# Patient Record
Sex: Female | Born: 1955
Health system: Southern US, Community
[De-identification: ages and names within clinical notes are randomized; demographics above are authoritative.]

## PROBLEM LIST (undated history)

## (undated) DIAGNOSIS — Z9889 Other specified postprocedural states: Secondary | ICD-10-CM

## (undated) DIAGNOSIS — M549 Dorsalgia, unspecified: Secondary | ICD-10-CM

## (undated) DIAGNOSIS — N1330 Unspecified hydronephrosis: Secondary | ICD-10-CM

## (undated) DIAGNOSIS — G8929 Other chronic pain: Secondary | ICD-10-CM

## (undated) DIAGNOSIS — E7800 Pure hypercholesterolemia, unspecified: Secondary | ICD-10-CM

## (undated) DIAGNOSIS — N135 Crossing vessel and stricture of ureter without hydronephrosis: Secondary | ICD-10-CM

## (undated) DIAGNOSIS — I10 Essential (primary) hypertension: Secondary | ICD-10-CM

## (undated) DIAGNOSIS — E039 Hypothyroidism, unspecified: Secondary | ICD-10-CM

## (undated) DIAGNOSIS — R112 Nausea with vomiting, unspecified: Secondary | ICD-10-CM

## (undated) DIAGNOSIS — T8859XA Other complications of anesthesia, initial encounter: Secondary | ICD-10-CM

## (undated) DIAGNOSIS — E119 Type 2 diabetes mellitus without complications: Secondary | ICD-10-CM

## (undated) DIAGNOSIS — Z87442 Personal history of urinary calculi: Secondary | ICD-10-CM

## (undated) HISTORY — PX: BACK SURGERY: SHX140

## (undated) HISTORY — PX: ABDOMINAL HYSTERECTOMY: SHX81

## (undated) HISTORY — PX: CHOLECYSTECTOMY: SHX55

---

## 1998-07-27 ENCOUNTER — Encounter: Payer: Self-pay | Admitting: Neurological Surgery

## 1998-07-27 ENCOUNTER — Ambulatory Visit (HOSPITAL_COMMUNITY): Admission: RE | Admit: 1998-07-27 | Discharge: 1998-07-27 | Payer: Self-pay | Admitting: Neurological Surgery

## 1998-10-16 ENCOUNTER — Observation Stay (HOSPITAL_COMMUNITY): Admission: RE | Admit: 1998-10-16 | Discharge: 1998-10-17 | Payer: Self-pay | Admitting: Neurosurgery

## 1998-10-16 ENCOUNTER — Encounter: Payer: Self-pay | Admitting: Neurosurgery

## 2000-04-07 ENCOUNTER — Other Ambulatory Visit: Admission: RE | Admit: 2000-04-07 | Discharge: 2000-04-07 | Payer: Self-pay | Admitting: Obstetrics & Gynecology

## 2001-02-28 ENCOUNTER — Other Ambulatory Visit: Admission: RE | Admit: 2001-02-28 | Discharge: 2001-02-28 | Payer: Self-pay | Admitting: Obstetrics & Gynecology

## 2001-04-07 ENCOUNTER — Ambulatory Visit (HOSPITAL_COMMUNITY): Admission: RE | Admit: 2001-04-07 | Discharge: 2001-04-07 | Payer: Self-pay | Admitting: Obstetrics & Gynecology

## 2001-04-07 ENCOUNTER — Encounter: Payer: Self-pay | Admitting: Obstetrics & Gynecology

## 2003-01-21 ENCOUNTER — Other Ambulatory Visit: Admission: RE | Admit: 2003-01-21 | Discharge: 2003-01-21 | Payer: Self-pay | Admitting: Obstetrics & Gynecology

## 2009-09-13 HISTORY — PX: SP DIL URETER: HXRAD352

## 2009-09-13 HISTORY — PX: URETERAL STENT PLACEMENT: SHX822

## 2010-01-22 ENCOUNTER — Emergency Department (HOSPITAL_COMMUNITY): Admission: EM | Admit: 2010-01-22 | Discharge: 2010-01-22 | Payer: Self-pay | Admitting: Emergency Medicine

## 2010-02-10 ENCOUNTER — Ambulatory Visit (HOSPITAL_COMMUNITY): Admission: RE | Admit: 2010-02-10 | Discharge: 2010-02-10 | Payer: Self-pay | Admitting: Urology

## 2010-02-18 ENCOUNTER — Ambulatory Visit (HOSPITAL_COMMUNITY): Admission: RE | Admit: 2010-02-18 | Discharge: 2010-02-18 | Payer: Self-pay | Admitting: Urology

## 2010-11-30 LAB — CBC
HCT: 37.4 % (ref 36.0–46.0)
Hemoglobin: 13 g/dL (ref 12.0–15.0)
RDW: 14 % (ref 11.5–15.5)

## 2010-11-30 LAB — BASIC METABOLIC PANEL
GFR calc non Af Amer: >60 mL/min (ref >60)
Glucose, Bld: 132 mg/dL — ABNORMAL HIGH (ref 70–99)
Potassium: 3.6 mEq/L (ref 3.5–5.1)
Sodium: 137 mEq/L (ref 135–145)

## 2010-12-01 LAB — BASIC METABOLIC PANEL
Calcium: 9.3 mg/dL (ref 8.4–10.5)
Creatinine, Ser: 0.53 mg/dL (ref 0.4–1.2)
GFR calc Af Amer: >60 mL/min (ref >60)

## 2010-12-01 LAB — DIFFERENTIAL
Basophils Relative: 0 % (ref 0–1)
Lymphs Abs: 1.6 10*3/uL (ref 0.7–4.0)
Monocytes Relative: 1 % — ABNORMAL LOW (ref 3–12)
Neutro Abs: 16.6 10*3/uL — ABNORMAL HIGH (ref 1.7–7.7)
Neutrophils Relative %: 90 % — ABNORMAL HIGH (ref 43–77)

## 2010-12-01 LAB — CBC
Platelets: 220 10*3/uL (ref 150–400)
RBC: 4.5 MIL/uL (ref 3.87–5.11)
WBC: 18.5 10*3/uL — ABNORMAL HIGH (ref 4.0–10.5)

## 2010-12-01 LAB — URINALYSIS, ROUTINE W REFLEX MICROSCOPIC
Glucose, UA: 100 mg/dL — AB
Ketones, ur: 15 mg/dL — AB
Specific Gravity, Urine: 1.02 (ref 1.005–1.030)
pH: 7 (ref 5.0–8.0)

## 2010-12-01 LAB — URINE MICROSCOPIC-ADD ON

## 2014-01-01 ENCOUNTER — Emergency Department (HOSPITAL_COMMUNITY)
Admission: EM | Admit: 2014-01-01 | Discharge: 2014-01-01 | Disposition: A | Discharge status: Home / Self Care | Payer: 59 | Attending: Emergency Medicine | Admitting: Emergency Medicine

## 2014-01-01 ENCOUNTER — Encounter (HOSPITAL_COMMUNITY): Payer: Self-pay | Admitting: Emergency Medicine

## 2014-01-01 DIAGNOSIS — E119 Type 2 diabetes mellitus without complications: Secondary | ICD-10-CM | POA: Insufficient documentation

## 2014-01-01 DIAGNOSIS — G8911 Acute pain due to trauma: Secondary | ICD-10-CM | POA: Insufficient documentation

## 2014-01-01 DIAGNOSIS — M533 Sacrococcygeal disorders, not elsewhere classified: Secondary | ICD-10-CM

## 2014-01-01 DIAGNOSIS — G8929 Other chronic pain: Secondary | ICD-10-CM | POA: Insufficient documentation

## 2014-01-01 DIAGNOSIS — Z87448 Personal history of other diseases of urinary system: Secondary | ICD-10-CM | POA: Insufficient documentation

## 2014-01-01 DIAGNOSIS — Z87891 Personal history of nicotine dependence: Secondary | ICD-10-CM | POA: Insufficient documentation

## 2014-01-01 DIAGNOSIS — I10 Essential (primary) hypertension: Secondary | ICD-10-CM | POA: Insufficient documentation

## 2014-01-01 DIAGNOSIS — Z9889 Other specified postprocedural states: Secondary | ICD-10-CM | POA: Insufficient documentation

## 2014-01-01 HISTORY — DX: Type 2 diabetes mellitus without complications: E11.9

## 2014-01-01 HISTORY — DX: Dorsalgia, unspecified: M54.9

## 2014-01-01 HISTORY — DX: Other chronic pain: G89.29

## 2014-01-01 HISTORY — DX: Essential (primary) hypertension: I10

## 2014-01-01 HISTORY — DX: Crossing vessel and stricture of ureter without hydronephrosis: N13.5

## 2014-01-01 HISTORY — DX: Unspecified hydronephrosis: N13.30

## 2014-01-01 MED ORDER — KETOROLAC TROMETHAMINE 60 MG/2ML IM SOLN
60.0000 mg | Freq: Once | INTRAMUSCULAR | Status: AC
  Administered 2014-01-01: 60 mg via INTRAMUSCULAR
  Filled 2014-01-01: qty 2

## 2014-01-01 MED ORDER — NAPROXEN 500 MG PO TABS: 500.0000 mg | ORAL_TABLET | Freq: Two times a day (BID) | ORAL | Status: DC

## 2014-01-01 MED ORDER — METHOCARBAMOL 500 MG PO TABS: ORAL_TABLET | ORAL | Status: DC

## 2014-01-01 MED ORDER — TRAMADOL HCL 50 MG PO TABS: 100.0000 mg | ORAL_TABLET | Freq: Four times a day (QID) | ORAL | Status: DC | PRN

## 2014-01-01 NOTE — Discharge Instructions (Signed)
Try ice and heat for your pain. Take the naprosyn twice a day. You can take tramadol 1000 mg with acetaminophen 1000 mg 4 times a day if needed for worsening pain.  Your pain is in your left sacro illiac joint. Consider seeing Dr Annette Stable if you continue to have pain.    Sacroiliac Joint Dysfunction The sacroiliac joint connects the lower part of the spine (the sacrum) with the bones of the pelvis. CAUSES  Sometimes, there is no obvious reason for sacroiliac joint dysfunction. Other times, it may occur   During pregnancy.  After injury, such as:  Car accidents.  Sport-related injuries.  Work-related injuries.  Due to one leg being shorter than the other.  Due to other conditions that affect the joints, such as:  Rheumatoid arthritis.  Gout.  Psoriasis.  Joint infection (septic arthritis). SYMPTOMS  Symptoms may include:  Pain in the:  Lower back.  Buttocks.  Groin.  Thighs and legs.  Difficult sitting, standing, walking, lying, bending or lifting. DIAGNOSIS  A number of tests may be used to help diagnose the cause of sacroiliac joint dysfunction, including:  Imaging tests to look for other causes of pain, including:  MRI.  CT scan.  Bone scan.  Diagnostic injection: During a special x-ray (called fluoroscopy), a needle is put into the sacroiliac joint. A numbing medicine is injected into the joint. If the pain is improved or stopped, the diagnosis of sacroiliac joint dysfunction is more likely. TREATMENT  There are a number of types of treatment used for sacroiliac joint dysfunction, including:  Only take over-the-counter or prescription medicines for pain, discomfort, or fever as directed by your caregiver.  Medications to relax muscles.  Rest. Decreasing activity can help cut down on painful muscle spasms and allow the back to heal.  Application of heat or ice to the lower back may improve muscle spasms and soothe pain.  Brace. A special back brace,  called a sacroiliac belt, can help support the joint while your back is healing.  Physical therapy can help teach comfortable positions and exercises to strengthen muscles that support the sacroiliac joint.  Cortisone injections. Injections of steroid medicine into the joint can help decrease swelling and improve pain.  Hyaluronic acid injections. This chemical improves lubrication within the sacroiliac joint, thereby decreasing pain.  Radiofrequency ablation. A special needle is placed into the joint, where it burns away nerves that are carrying pain messages from the joint.  Surgery. Because pain occurs during movement of the joint, screws and plates may be installed in order to limit or prevent joint motion. HOME CARE INSTRUCTIONS   Take all medications exactly as directed.  Follow instructions regarding both rest and physical activity, to avoid worsening the pain.  Do physical therapy exercises exactly as prescribed. SEEK IMMEDIATE MEDICAL CARE IF:  You experience increasingly severe pain.  You develop new symptoms, such as numbness or tingling in your legs or feet.  You lose bladder or bowel control. Document Released: 11/26/2008 Document Revised: 11/22/2011 Document Reviewed: 11/26/2008 [facility] Patient Information 2014 [place], [place].

## 2014-01-01 NOTE — ED Notes (Signed)
Pt with lower back pain for a month, states pain had gotten better, today while at work and went to reach for something but had sudden back pain

## 2014-01-01 NOTE — ED Notes (Signed)
Pt able to ambulate in to triage room, BP elevated acuity a 3, pt states that she has taken HTN med today

## 2014-01-01 NOTE — ED Provider Notes (Signed)
CSN: [id number]     Arrival date & time 01/01/14  1324 History  This chart was scribed for Janice Norrie, MD by Jenne Campus, ED Scribe. This patient was seen in room APA03/APA03 and the patient's care was started at 3:19 PM.   Chief Complaint  Patient presents with  . Back Pain    The history is provided by the patient. No language interpreter was used.    HPI Comments: Sophia Young is a 58 y.o. female who presents to the Emergency Department complaining of sudden onset, gradually improving left lower back pain that radiates around the hip and down the left anterior leg that occurred today while at work. Pt states that she works at [employer] and about 10:30 am she was bending over to pull out a box of pillows on a lower shelf when she felt a sudden "grabbing" pain. She states that since the onset the pain has improved since taking 2 ibuprofen around noon after the incident. She reports that sitting "isn't the most comfortable but it's not excruciating". She denies any changes in the pain with movement or changing position. She reports that she called Dr. Orson Ape but was told she would be unable to get an appointment. She had a lumbar surgery done by Dr. Annette Stable in [place] in 2000 and has not had any ongoing problems since then. Pt reports a slip and fall on ice in her work's parking lot 2 months ago without pain initially. She reports that she developed pain in the same place as today with flare ups every couple of weeks a few weeks afterward that fall. She denies any bowel or urinary incontinence as associated symptoms. She denies smoking.   PCP Dr Barnetta Hammersmith  Past Medical History  Diagnosis Date  . Diabetes mellitus without complication   . Hypertension   . Chronic back pain   . Ureteral stricture, left   . Hydronephrosis of left kidney     chronic  . DDD (degenerative disc disease), lumbar    Past Surgical History  Procedure Laterality Date  . Back surgery    . Abdominal hysterectomy     . Cholecystectomy    . Sp dil ureter Left 2011  . Ureteral stent placement Left 2011   History reviewed. No pertinent family history. History  Substance Use Topics  . Smoking status: Former Research scientist (life sciences)  . Smokeless tobacco: Not on file  . Alcohol Use: No  works at [employer]  No OB history provided.  Review of Systems  Gastrointestinal:       No bowel incontinence   Genitourinary:       No bladder incontinence   Musculoskeletal: Positive for back pain.  Neurological: Negative for weakness and numbness.  All other systems reviewed and are negative.   Allergies  Review of patient's allergies indicates no known allergies.  Home Medications   Prior to Admission medications   Not on File   Triage Vitals: BP 202/90  Pulse 89  Temp(Src) 98.2 F (36.8 C) (Oral)  Resp 18  Ht 5\' 2"  (1.575 m)  Wt 200 lb (90.719 kg)  BMI 36.57 kg/m2  SpO2 95%  Vital signs normal except for hypertension   Physical Exam  Nursing note and vitals reviewed. Constitutional: She is oriented to person, place, and time. She appears well-developed and well-nourished.  Non-toxic appearance. She does not appear ill. No distress.  HENT:  Head: Normocephalic and atraumatic.  Nose: No mucosal edema or rhinorrhea.  Mouth/Throat: Mucous membranes are  normal. No dental abscesses or uvula swelling.  Eyes: EOM are normal.  Neck: Normal range of motion and full passive range of motion without pain. Neck supple.  Cardiovascular: Normal rate.  Exam reveals no friction rub.   Pulmonary/Chest: Effort normal.  Musculoskeletal: Normal range of motion. She exhibits no edema and no tenderness.       Back:  Moves all extremities well. Non-tender thoracic and lumbar spine. Non-tender right SI joint. Pain is over the left SI joint. No pain with ROM at the waist. Negative SLR.  Neurological: She is alert and oriented to person, place, and time. She has normal strength. No cranial nerve deficit.  Patellar reflexes are 2+ and  equal  Skin: Skin is warm, dry and intact. No rash noted. No erythema. No pallor.  Psychiatric: She has a normal mood and affect. Her speech is normal and behavior is normal. Her mood appears not anxious.    ED Course  Procedures (including critical care time)  Medications  ketorolac (TORADOL) injection 60 mg (60 mg Intramuscular Given 01/01/14 1547)    DIAGNOSTIC STUDIES: Oxygen Saturation is 95% on RA, adequate by my interpretation.    COORDINATION OF CARE: 3:30 PM-\ Discussed treatment plan which includes pain medication with pt at bedside and pt agreed to plan.   Labs Review Labs Reviewed - No data to display  Imaging Review No results found.   EKG Interpretation None      MDM   Final diagnoses:  Sacro-iliac pain   New Prescriptions   METHOCARBAMOL (ROBAXIN) 500 MG TABLET    Take 1 or 2 po Q 6hrs for pain   NAPROXEN (NAPROSYN) 500 MG TABLET    Take 1 tablet (500 mg total) by mouth 2 (two) times daily.   TRAMADOL (ULTRAM) 50 MG TABLET    Take 2 tablets (100 mg total) by mouth every 6 (six) hours as needed.    Plan discharge    Rolland Porter, MD, FACEP     I personally performed the services described in this documentation, which was scribed in my presence. The recorded information has been reviewed and considered.  Rolland Porter, MD, FACEP    Janice Norrie, MD 01/02/14 [id number]

## 2014-06-13 ENCOUNTER — Other Ambulatory Visit (HOSPITAL_COMMUNITY): Payer: Self-pay | Admitting: Family Medicine

## 2014-06-13 DIAGNOSIS — Z139 Encounter for screening, unspecified: Secondary | ICD-10-CM

## 2014-06-17 ENCOUNTER — Ambulatory Visit (HOSPITAL_COMMUNITY)
Admission: RE | Admit: 2014-06-17 | Discharge: 2014-06-17 | Disposition: A | Discharge status: Home / Self Care | Payer: 59 | Source: Ambulatory Visit | Attending: Family Medicine | Admitting: Family Medicine

## 2014-06-17 DIAGNOSIS — Z1231 Encounter for screening mammogram for malignant neoplasm of breast: Secondary | ICD-10-CM | POA: Diagnosis not present

## 2014-06-17 DIAGNOSIS — Z139 Encounter for screening, unspecified: Secondary | ICD-10-CM

## 2015-02-14 ENCOUNTER — Ambulatory Visit (HOSPITAL_COMMUNITY)
Admission: RE | Admit: 2015-02-14 | Discharge: 2015-02-14 | Disposition: A | Discharge status: Home / Self Care | Payer: 59 | Source: Ambulatory Visit | Attending: Family Medicine | Admitting: Family Medicine

## 2015-02-14 ENCOUNTER — Other Ambulatory Visit (HOSPITAL_COMMUNITY): Payer: Self-pay | Admitting: Family Medicine

## 2015-02-14 DIAGNOSIS — M898X8 Other specified disorders of bone, other site: Secondary | ICD-10-CM | POA: Insufficient documentation

## 2015-02-14 DIAGNOSIS — M89319 Hypertrophy of bone, unspecified shoulder: Secondary | ICD-10-CM

## 2015-11-19 ENCOUNTER — Other Ambulatory Visit (HOSPITAL_COMMUNITY): Payer: Self-pay | Admitting: Family Medicine

## 2015-11-19 DIAGNOSIS — Z1231 Encounter for screening mammogram for malignant neoplasm of breast: Secondary | ICD-10-CM

## 2015-11-20 ENCOUNTER — Ambulatory Visit (HOSPITAL_COMMUNITY)
Admission: RE | Admit: 2015-11-20 | Discharge: 2015-11-20 | Disposition: A | Discharge status: Home / Self Care | Payer: 59 | Source: Ambulatory Visit | Attending: Family Medicine | Admitting: Family Medicine

## 2015-11-20 DIAGNOSIS — Z1231 Encounter for screening mammogram for malignant neoplasm of breast: Secondary | ICD-10-CM | POA: Insufficient documentation

## 2017-04-26 ENCOUNTER — Telehealth: Payer: Self-pay

## 2017-04-26 NOTE — Telephone Encounter (Signed)
Pt received a triage letter from DS. Please call her back at [phone] no gi problems, no blood thinners or heart attacks, letter received in Nov 2017

## 2017-04-28 NOTE — Telephone Encounter (Signed)
Also, recent letter in June. LMOM for a return call.

## 2017-05-03 ENCOUNTER — Telehealth: Payer: Self-pay

## 2017-05-03 NOTE — Telephone Encounter (Signed)
See triage

## 2017-05-05 NOTE — Telephone Encounter (Signed)
Gastroenterology Pre-Procedure Review  Request Date: 05/03/2017 Requesting Physician: Dr. Gerarda Fraction  PATIENT REVIEW QUESTIONS: The patient responded to the following health history questions as indicated:    This will be pt's first colonoscopy  1. Diabetes Melitis: YES 2. Joint replacements in the past 12 months: no 3. Major health problems in the past 3 months: no 4. Has an artificial valve or MVP: no 5. Has a defibrillator: no 6. Has been advised in past to take antibiotics in advance of a procedure like teeth cleaning: no 7. Family history of colon cancer: no  8. Alcohol Use: no 9. History of sleep apnea: no  10. History of coronary artery or other vascular stents placed within the last 12 months: no 11. History of any prior anesthesia complications: no    MEDICATIONS & ALLERGIES:    Patient reports the following regarding taking any blood thinners:   Plavix? no Aspirin? YES Coumadin? no Brilinta? no Xarelto? no Eliquis? no Pradaxa? no Savaysa? no Effient? no  Patient confirms/reports the following medications:  Current Outpatient Prescriptions  Medication Sig Dispense Refill  . aspirin EC 81 MG tablet Take 81 mg by mouth daily.    Marland Kitchen levothyroxine (SYNTHROID, LEVOTHROID) 75 MCG tablet Take 75 mcg by mouth daily before breakfast.    . losartan (COZAAR) 50 MG tablet Take 50 mg by mouth daily.    . metformin (FORTAMET) 1000 MG (OSM) 24 hr tablet Take 1,000 mg by mouth daily with breakfast.    . pravastatin (PRAVACHOL) 40 MG tablet Take 40 mg by mouth daily.    Marland Kitchen triamterene-hydrochlorothiazide (MAXZIDE-25) 37.5-25 MG tablet Take 1 tablet by mouth daily.     No current facility-administered medications for this visit.     Patient confirms/reports the following allergies:  No Known Allergies  No orders of the defined types were placed in this encounter.   AUTHORIZATION INFORMATION Primary Insurance:   ID #:   Group #:  Pre-Cert / Auth required: Pre-Cert / Auth  #:  Secondary Insurance:   ID #:  Group #:  Pre-Cert / Auth required:  Pre-Cert / Auth #:   SCHEDULE INFORMATION: Procedure has been scheduled as follows:  Date: 06/01/2017                   Time:  7:30 AM Location: [facility] Short Stay  This Gastroenterology Pre-Precedure Review Form is being routed to the following provider(s): R. Garfield Cornea, MD

## 2017-05-06 ENCOUNTER — Other Ambulatory Visit: Payer: Self-pay

## 2017-05-06 DIAGNOSIS — Z1211 Encounter for screening for malignant neoplasm of colon: Secondary | ICD-10-CM

## 2017-05-06 MED ORDER — NA SULFATE-K SULFATE-MG SULF 17.5-3.13-1.6 GM/177ML PO SOLN: 1.0000 | ORAL | 0 refills | Status: DC

## 2017-05-06 NOTE — Telephone Encounter (Signed)
Appropriate. No metformin day of procedure.  

## 2017-05-06 NOTE — Telephone Encounter (Signed)
Rx sent to the pharmacy and instructions mailed to pt.  

## 2017-05-10 NOTE — Telephone Encounter (Signed)
PA# for TCS  [id number]

## 2017-06-01 ENCOUNTER — Encounter (HOSPITAL_COMMUNITY): Payer: Self-pay | Admitting: *Deleted

## 2017-06-01 ENCOUNTER — Ambulatory Visit (HOSPITAL_COMMUNITY)
Admission: RE | Admit: 2017-06-01 | Discharge: 2017-06-01 | Disposition: A | Discharge status: Home / Self Care | Payer: 59 | Source: Ambulatory Visit | Attending: Internal Medicine | Admitting: Internal Medicine

## 2017-06-01 ENCOUNTER — Encounter (HOSPITAL_COMMUNITY)
Admission: RE | Disposition: A | Discharge status: Home / Self Care | Payer: Self-pay | Source: Ambulatory Visit | Attending: Internal Medicine

## 2017-06-01 DIAGNOSIS — E119 Type 2 diabetes mellitus without complications: Secondary | ICD-10-CM | POA: Diagnosis not present

## 2017-06-01 DIAGNOSIS — Z7982 Long term (current) use of aspirin: Secondary | ICD-10-CM | POA: Insufficient documentation

## 2017-06-01 DIAGNOSIS — D122 Benign neoplasm of ascending colon: Secondary | ICD-10-CM | POA: Insufficient documentation

## 2017-06-01 DIAGNOSIS — Z1211 Encounter for screening for malignant neoplasm of colon: Secondary | ICD-10-CM

## 2017-06-01 DIAGNOSIS — Z87891 Personal history of nicotine dependence: Secondary | ICD-10-CM | POA: Insufficient documentation

## 2017-06-01 DIAGNOSIS — I10 Essential (primary) hypertension: Secondary | ICD-10-CM | POA: Diagnosis not present

## 2017-06-01 DIAGNOSIS — K5730 Diverticulosis of large intestine without perforation or abscess without bleeding: Secondary | ICD-10-CM | POA: Insufficient documentation

## 2017-06-01 DIAGNOSIS — Z7984 Long term (current) use of oral hypoglycemic drugs: Secondary | ICD-10-CM | POA: Insufficient documentation

## 2017-06-01 DIAGNOSIS — Z79899 Other long term (current) drug therapy: Secondary | ICD-10-CM | POA: Insufficient documentation

## 2017-06-01 DIAGNOSIS — E7800 Pure hypercholesterolemia, unspecified: Secondary | ICD-10-CM | POA: Diagnosis not present

## 2017-06-01 DIAGNOSIS — D124 Benign neoplasm of descending colon: Secondary | ICD-10-CM | POA: Insufficient documentation

## 2017-06-01 HISTORY — DX: Pure hypercholesterolemia, unspecified: E78.00

## 2017-06-01 HISTORY — PX: POLYPECTOMY: SHX5525

## 2017-06-01 HISTORY — PX: COLONOSCOPY: SHX5424

## 2017-06-01 HISTORY — PX: BIOPSY: SHX5522

## 2017-06-01 LAB — GLUCOSE, CAPILLARY: Glucose-Capillary: 217 mg/dL — ABNORMAL HIGH (ref 65–99)

## 2017-06-01 SURGERY — COLONOSCOPY
Anesthesia: Moderate Sedation

## 2017-06-01 MED ORDER — ONDANSETRON HCL 4 MG/2ML IJ SOLN
INTRAMUSCULAR | Status: AC
  Filled 2017-06-01: qty 2

## 2017-06-01 MED ORDER — ONDANSETRON HCL 4 MG/2ML IJ SOLN
INTRAMUSCULAR | Status: DC | PRN
  Administered 2017-06-01: 4 mg via INTRAVENOUS

## 2017-06-01 MED ORDER — MEPERIDINE HCL 100 MG/ML IJ SOLN
INTRAMUSCULAR | Status: DC
Start: 2017-06-01 — End: 2017-06-01
  Filled 2017-06-01: qty 2

## 2017-06-01 MED ORDER — MEPERIDINE HCL 100 MG/ML IJ SOLN
INTRAMUSCULAR | Status: DC | PRN
  Administered 2017-06-01 (×2): 25 mg via INTRAVENOUS
  Administered 2017-06-01: 50 mg via INTRAVENOUS

## 2017-06-01 MED ORDER — MIDAZOLAM HCL 5 MG/5ML IJ SOLN
INTRAMUSCULAR | Status: DC | PRN
  Administered 2017-06-01: 2 mg via INTRAVENOUS
  Administered 2017-06-01: 1 mg via INTRAVENOUS
  Administered 2017-06-01: 2 mg via INTRAVENOUS

## 2017-06-01 MED ORDER — STERILE WATER FOR IRRIGATION IR SOLN
Status: DC | PRN
  Administered 2017-06-01: 08:00:00

## 2017-06-01 MED ORDER — SODIUM CHLORIDE 0.9 % IV SOLN
INTRAVENOUS | Status: DC
  Administered 2017-06-01: 07:00:00 via INTRAVENOUS

## 2017-06-01 MED ORDER — MIDAZOLAM HCL 5 MG/5ML IJ SOLN
INTRAMUSCULAR | Status: AC
  Filled 2017-06-01: qty 10

## 2017-06-01 NOTE — Op Note (Signed)
[facility] Patient Name: Sophia Young Procedure Date: 06/01/2017 7:28 AM MRN: [id number] Date of Birth: 1956/06/10 Attending MD: Norvel Richards , MD CSN: [id number] Age: 61 Admit Type: Outpatient Procedure:                Colonoscopy Indications:              Screening for colorectal malignant neoplasm Providers:                Norvel Richards, MD, Jeanann Lewandowsky. Sharon Seller, RN,                            Randa Spike, Technician Referring MD:              Medicines:                Midazolam 5 mg IV, Meperidine 086 mg IV Complications:            No immediate complications. Estimated Blood Loss:     Estimated blood loss was minimal. Procedure:                Pre-Anesthesia Assessment:                           - Prior to the procedure, a History and Physical                            was performed, and patient medications and                            allergies were reviewed. The patient's tolerance of                            previous anesthesia was also reviewed. The risks                            and benefits of the procedure and the sedation                            options and risks were discussed with the patient.                            All questions were answered, and informed consent                            was obtained. Prior Anticoagulants: The patient has                            taken no previous anticoagulant or antiplatelet                            agents. ASA Grade Assessment: II - A patient with                            mild systemic disease. After reviewing the risks  and benefits, the patient was deemed in                            satisfactory condition to undergo the procedure.                           After obtaining informed consent, the colonoscope                            was passed under direct vision. Throughout the                            procedure, the patient's blood pressure, pulse, and                          oxygen saturations were monitored continuously. The                            EC-[id number] ([id number]) scope was introduced through                            the anus and advanced to the the cecum, identified                            by appendiceal orifice and ileocecal valve. The                            colonoscopy was performed without difficulty. The                            patient tolerated the procedure well. The quality                            of the bowel preparation was adequate. The entire                            colon was well visualized. The ileocecal valve,                            appendiceal orifice, and rectum were photographed. Scope In: 7:51:18 AM Scope Out: 8:07:43 AM Scope Withdrawal Time: 0 hours 8 minutes 47 seconds  Total Procedure Duration: 0 hours 16 minutes 25 seconds  Findings:      The perianal and digital rectal examinations were normal.      A 6 mm polyp was found in the ascending colon. The polyp was sessile.       The polyp was removed with a cold snare. Resection and retrieval were       complete. Estimated blood loss was minimal.      Scattered small and large-mouthed diverticula were found in the entire       colon.      An area of mildly congested mucosa was found in the colon. It was       hyperemic involving approximately a 3 x 3 cm area. She had done in a  slit appeared to be ossibly some pus coming out of this area. Abnormal       mucosa at the periphery biopsied. This was biopsied with a cold forceps       for histology. Estimated blood loss was minimal.      The exam was otherwise without abnormality on direct and retroflexion       views. Impression:               - One 6 mm polyp in the ascending colon, removed                            with a cold snare. Resected and retrieved.                           - Diverticulosis in the entire examined colon.                           - Congested mucosa. Focal  area in descending                            segment. Query focal diverticulitis versus other                            process. Biopsied.                           - The examination was otherwise normal on direct                            and retroflexion views. Of note, patient states                            that she has had no abdominal pain, whatsoever.                            Spoke to mother in postop patient has been having                            crampy lower adominal pain since last week but has                            not told anybody. No fever or chills reported. I                            suspect endoscopic findings may represent focal                            diverticulitis. Moderate Sedation:      Moderate (conscious) sedation was administered by the endoscopy nurse       and supervised by the endoscopist. The following parameters were       monitored: oxygen saturation, heart rate, blood pressure, respiratory       rate, EKG, adequacy of pulmonary ventilation, and response to care.       Total physician intraservice time was 25 minutes.  Recommendation:           - Written discharge instructions were provided to                            the patient.                           - The signs and symptoms of potential delayed                            complications were discussed with the patient.                           - Patient has a contact number available for                            emergencies.                           - Return to normal activities tomorrow.                           - Clear liquid diet today advanced low residue as                            tolerated.- Continue present medications. Cipro 500                            mg twice a day x10 days. Flagyl 250 mg 3 times a                            day x10 days.                           - Repeat colonoscopy date to be determined after                            pending pathology  results are reviewed for                            surveillance based on pathology results.                           - Return to GI clinic (date not yet determined).                            Office visit with [place] in 6-8 weeks. Procedure Code(s):        --- Professional ---                           [phone], Colonoscopy, flexible; with removal of                            tumor(s), polyp(s), or other lesion(s) by snare  technique                           45380, 59, Colonoscopy, flexible; with biopsy,                            single or multiple                           99152, Moderate sedation services provided by the                            same physician or other qualified health care                            professional performing the diagnostic or                            therapeutic service that the sedation supports,                            requiring the presence of an independent trained                            observer to assist in the monitoring of the                            patient's level of consciousness and physiological                            status; initial 15 minutes of intraservice time,                            patient age 27 years or older                           [phone], Moderate sedation services; each additional                            15 minutes intraservice time Diagnosis Code(s):        --- Professional ---                           Z12.11, Encounter for screening for malignant                            neoplasm of colon                           D12.2, Benign neoplasm of ascending colon                           K63.89, Other specified diseases of intestine                           K57.30, Diverticulosis of large intestine without  perforation or abscess without bleeding CPT copyright 2016 [employer]. All rights reserved. The codes documented in this report are  preliminary and upon coder review may  be revised to meet current compliance requirements. Cristopher Estimable. Shuronda Santino, MD Norvel Richards, MD 06/01/2017 8:50:55 AM This report has been signed electronically. Number of Addenda: 0

## 2017-06-01 NOTE — Discharge Instructions (Addendum)
Colonoscopy, Adult, Care After This sheet gives you information about how to care for yourself after your procedure. Your health care provider may also give you more specific instructions. If you have problems or questions, contact your health care provider. What can I expect after the procedure? After the procedure, it is common to have:  A small amount of blood in your stool for 24 hours after the procedure.  Some gas.  Mild abdominal cramping or bloating.  Follow these instructions at home: General instructions   For the first 24 hours after the procedure: ? Do not drive or use machinery. ? Do not sign important documents. ? Do not drink alcohol. ? Do your regular daily activities at a slower pace than normal. ? Eat soft, easy-to-digest foods. ? Rest often.  Take over-the-counter or prescription medicines only as told by your health care provider.  It is up to you to get the results of your procedure. Ask your health care provider, or the department performing the procedure, when your results will be ready. Relieving cramping and bloating  Try walking around when you have cramps or feel bloated.  Apply heat to your abdomen as told by your health care provider. Use a heat source that your health care provider recommends, such as a moist heat pack or a heating pad. ? Place a towel between your skin and the heat source. ? Leave the heat on for 20-30 minutes. ? Remove the heat if your skin turns bright red. This is especially important if you are unable to feel pain, heat, or cold. You may have a greater risk of getting burned. Eating and drinking  Drink enough fluid to keep your urine clear or pale yellow.  Resume your normal diet as instructed by your health care provider. Avoid heavy or fried foods that are hard to digest.  Avoid drinking alcohol for as long as instructed by your health care provider. Contact a health care provider if:  You have blood in your stool 2-3  days after the procedure. Get help right away if:  You have more than a small spotting of blood in your stool.  You pass large blood clots in your stool.  Your abdomen is swollen.  You have nausea or vomiting.  You have a fever.  You have increasing abdominal pain that is not relieved with medicine. This information is not intended to replace advice given to you by your health care provider. Make sure you discuss any questions you have with your health care provider. Document Released: 04/13/2004 Document Revised: 05/24/2016 Document Reviewed: 11/11/2015 Elsevier Interactive Patient Education  2018 [employer].   Colon Polyps Polyps are tissue growths inside the body. Polyps can grow in many places, including the large intestine (colon). A polyp may be a round bump or a mushroom-shaped growth. You could have one polyp or several. Most colon polyps are noncancerous (benign). However, some colon polyps can become cancerous over time. What are the causes? The exact cause of colon polyps is not known. What increases the risk? This condition is more likely to develop in people who:  Have a family history of colon cancer or colon polyps.  Are older than 22 or older than 45 if they are African American.  Have inflammatory bowel disease, such as ulcerative colitis or Crohn disease.  Are overweight.  Smoke cigarettes.  Do not get enough exercise.  Drink too much alcohol.  Eat a diet that is: ? High in fat and red meat. ?  Low in fiber.  Had childhood cancer that was treated with abdominal radiation.  What are the signs or symptoms? Most polyps do not cause symptoms. If you have symptoms, they may include:  Blood coming from your rectum when having a bowel movement.  Blood in your stool.The stool may look dark red or black.  A change in bowel habits, such as constipation or diarrhea.  How is this diagnosed? This condition is diagnosed with a colonoscopy. This is a  procedure that uses a lighted, flexible scope to look at the inside of your colon. How is this treated? Treatment for this condition involves removing any polyps that are found. Those polyps will then be tested for cancer. If cancer is found, your health care provider will talk to you about options for colon cancer treatment. Follow these instructions at home: Diet  Eat plenty of fiber, such as fruits, vegetables, and whole grains.  Eat foods that are high in calcium and vitamin D, such as milk, cheese, yogurt, eggs, liver, fish, and broccoli.  Limit foods high in fat, red meats, and processed meats, such as hot dogs, sausage, bacon, and lunch meats.  Maintain a healthy weight, or lose weight if recommended by your health care provider. General instructions  Do not smoke cigarettes.  Do not drink alcohol excessively.  Keep all follow-up visits as told by your health care provider. This is important. This includes keeping regularly scheduled colonoscopies. Talk to your health care provider about when you need a colonoscopy.  Exercise every day or as told by your health care provider. Contact a health care provider if:  You have new or worsening bleeding during a bowel movement.  You have new or increased blood in your stool.  You have a change in bowel habits.  You unexpectedly lose weight. This information is not intended to replace advice given to you by your health care provider. Make sure you discuss any questions you have with your health care provider. Document Released: 05/26/2004 Document Revised: 02/05/2016 Document Reviewed: 07/21/2015 Elsevier Interactive Patient Education  2018 [employer].   Colon polyp and diverticulosis information provided  Localized abnormality in your left colon may represent diverticulitis. Since you have been having abdominal pain recently, will go ahead and treat with a course of antibiotics. New  Cipro 500 mg orally twice daily 10 days.  Flagyl 250 mg 3 times a day 10 days  Clear liquid diet today; advance to a low residue diet beginning tomorrow for the next 5 days  Further recommendations to follow pending review of the pathology report

## 2017-06-01 NOTE — H&P (Signed)
$'@LOGO'c$ @   Primary Care Physician:  [facility] Primary Gastroenterologist:  Dr. Gala Romney  Pre-Procedure History & Physical: HPI:  Sophia Young is a 61 y.o. female is here for a screening colonoscopy. No bowel symptoms. No prior Colonoscopy. No family history of colon cancer.  Past Medical History:  Diagnosis Date  . Chronic back pain   . Diabetes mellitus without complication ([place])   . Hydronephrosis of left kidney    chronic  . Hypercholesteremia   . Hypertension   . Ureteral stricture, left     Past Surgical History:  Procedure Laterality Date  . ABDOMINAL HYSTERECTOMY    . BACK SURGERY    . CHOLECYSTECTOMY    . SP DIL URETER Left 2011  . URETERAL STENT PLACEMENT Left 2011    Prior to Admission medications   Medication Sig Start Date End Date Taking? Authorizing Provider  aspirin EC 81 MG tablet Take 81 mg by mouth daily.   Yes [provider]  levothyroxine (SYNTHROID, LEVOTHROID) 75 MCG tablet Take 75 mcg by mouth daily before breakfast.   Yes [provider]  losartan (COZAAR) 50 MG tablet Take 50 mg by mouth daily.   Yes [provider]  metformin (FORTAMET) 1000 MG (OSM) 24 hr tablet Take 1,000 mg by mouth daily with breakfast.   Yes [provider]  Na Sulfate-K Sulfate-Mg Sulf (SUPREP BOWEL PREP KIT) 17.5-3.13-1.6 GM/180ML SOLN Take 1 kit by mouth as directed. 05/06/17  Yes Taytum Wheller, Cristopher Estimable, MD  pravastatin (PRAVACHOL) 40 MG tablet Take 40 mg by mouth daily.   Yes [provider]  triamterene-hydrochlorothiazide (MAXZIDE-25) 37.5-25 MG tablet Take 1 tablet by mouth daily.   Yes [provider]    Allergies as of 05/06/2017  . (No Known Allergies)    Family History  Problem Relation Age of Onset  . Colon cancer Neg Hx     Social History   Social History  . Marital status: Widowed    Spouse name: N/A  . Number of children: N/A  . Years of education: N/A   Occupational History  . Not  on file.   Social History Main Topics  . Smoking status: Former Research scientist (life sciences)  . Smokeless tobacco: Never Used  . Alcohol use No  . Drug use: No  . Sexual activity: Not on file   Other Topics Concern  . Not on file   Social History Narrative  . No narrative on file    Review of Systems: See HPI, otherwise negative ROS  Physical Exam: BP (!) 155/72   Pulse 72   Temp 98.2 F (36.8 C) (Oral)   Ht '5\' 3"'$  (1.6 m)   Wt 210 lb (95.3 kg)   SpO2 97%   BMI 37.20 kg/m  General:   Alert,  Well-developed, well-nourished, pleasant and cooperative in NAD Lungs:  Clear throughout to auscultation.   No wheezes, crackles, or rhonchi. No acute distress. Heart:  Regular rate and rhythm; no murmurs, clicks, rubs,  or gallops. Abdomen:  Soft, nontender and nondistended. No masses, hepatosplenomegaly or hernias noted. Normal bowel sounds, without guarding, and without rebound.     Impression/Plan: Sophia Young is now here to undergo a screening colonoscopy.  First-ever average risk screening examination.  Risks, benefits, limitations, imponderables and alternatives regarding colonoscopy have been reviewed with the patient. Questions have been answered. All parties agreeable.  Notice:  This dictation was prepared with Dragon dictation along with smaller phrase technology. Any transcriptional errors that result from this process are unintentional and may not be corrected upon review.

## 2017-06-06 ENCOUNTER — Encounter (HOSPITAL_COMMUNITY): Payer: Self-pay | Admitting: Internal Medicine

## 2017-06-07 ENCOUNTER — Encounter: Payer: Self-pay | Admitting: Internal Medicine

## 2017-06-08 ENCOUNTER — Encounter: Payer: Self-pay | Admitting: Internal Medicine

## 2017-06-08 ENCOUNTER — Telehealth: Payer: Self-pay

## 2017-06-08 NOTE — Telephone Encounter (Signed)
Letter mailed to the pt. 

## 2017-06-08 NOTE — Telephone Encounter (Signed)
PATIENT SCHEDULED  °

## 2017-06-08 NOTE — Telephone Encounter (Signed)
Per RMR-  Rourk, Cristopher Estimable, MD  Claudina Lick, LPN; Theadora Rama        Send letter to patient.  Send copy of letter with path to referring provider and PCP.   Patient should have a follow-up appointment in 6-8 weeks

## 2017-06-14 ENCOUNTER — Telehealth: Payer: Self-pay | Admitting: Internal Medicine

## 2017-06-14 NOTE — Telephone Encounter (Signed)
Pt called to see if her colonoscopy results were available yet. Please call her at [phone]

## 2017-06-14 NOTE — Telephone Encounter (Signed)
Communication noted.  

## 2017-06-14 NOTE — Telephone Encounter (Signed)
Spoke with the pt, she has not gotten her letter yet. I went over the result letter with her. She said she thought the abx were giving her a yeast infection. I asked her if she has tried anything otc and she said no but she was willing to try it. If it doesn't help she will call me back.

## 2017-07-26 ENCOUNTER — Ambulatory Visit (INDEPENDENT_AMBULATORY_CARE_PROVIDER_SITE_OTHER): Payer: 59 | Admitting: Gastroenterology

## 2017-07-26 ENCOUNTER — Encounter: Payer: Self-pay | Admitting: Gastroenterology

## 2017-07-26 DIAGNOSIS — Z8719 Personal history of other diseases of the digestive system: Secondary | ICD-10-CM | POA: Diagnosis not present

## 2017-07-26 NOTE — Patient Instructions (Addendum)
1. Consider Benefiber 2 teaspoons twice daily given history of diverticulosis. This may also manage your intermittent constipation.  2. If you have ongoing constipation, you can take Miralax 17 grams at bedtime on days you do not have a good bowel movement.  3. Return to the office as needed.  4. Your next colonoscopy will be due in 05/2022.

## 2017-07-26 NOTE — Progress Notes (Signed)
       Primary Care Physician: [facility]  Primary Gastroenterologist:  Garfield Cornea, MD   Chief Complaint  Patient presents with  . pp f/u    tcs f/u; doing ok    HPI: Sophia Young is a 61 y.o. female here for follow up of recent screening colonoscopy performed September 2018.  Her next surveillance colonoscopy planned for 5 years for tubular adenomas.  She was found to have diverticulosis throughout the whole colon.  A segment in the descending colon measuring 3 x 3 cm, edematous and erythematous with possible pus coming from it suspected focal diverticulitis.  Biopsy from this area was benign and consistent with likely focal diverticulitis.  She was treated with Cipro and Flagyl.  Patient reports intermittent "flares".  States she figured she had diverticulosis and likely episodes of diverticulitis in the past based on her mother's history.  Her mother required partial colectomy for diverticular disease.  Since her colonoscopy she has been doing very well.  No abdominal pain.  No blood in the stool or melena.  She takes Aleve as needed for arthritic pain and states it causes constipation at times.  Wonders what she could take for it.  Current Outpatient Medications  Medication Sig Dispense Refill  . aspirin EC 81 MG tablet Take 81 mg by mouth daily.    Marland Kitchen levothyroxine (SYNTHROID, LEVOTHROID) 75 MCG tablet Take 75 mcg by mouth daily before breakfast.    . losartan (COZAAR) 50 MG tablet Take 50 mg by mouth daily.    . metformin (FORTAMET) 1000 MG (OSM) 24 hr tablet Take 1,000 mg by mouth daily with breakfast.    . pravastatin (PRAVACHOL) 40 MG tablet Take 40 mg by mouth daily.    Marland Kitchen triamterene-hydrochlorothiazide (MAXZIDE-25) 37.5-25 MG tablet Take 1 tablet by mouth daily.     No current facility-administered medications for this visit.     Allergies as of 07/26/2017  . (No Known Allergies)    ROS:  General: Negative for anorexia, weight loss, fever,  chills, fatigue, weakness. ENT: Negative for hoarseness, difficulty swallowing , nasal congestion. CV: Negative for chest pain, angina, palpitations, dyspnea on exertion, peripheral edema.  Respiratory: Negative for dyspnea at rest, dyspnea on exertion, cough, sputum, wheezing.  GI: See history of present illness. GU:  Negative for dysuria, hematuria, urinary incontinence, urinary frequency, nocturnal urination.  Endo: Negative for unusual weight change.    Physical Examination:   BP (!) 192/92   Pulse 65   Temp (!) 97.1 F (36.2 C) (Oral)   Ht 5\' 3"  (1.6 m)   Wt 219 lb 9.6 oz (99.6 kg)   BMI 38.90 kg/m   General: Well-nourished, well-developed in no acute distress.  Eyes: No icterus. Mouth: Oropharyngeal mucosa moist and pink , no lesions erythema or exudate. Lungs: Clear to auscultation bilaterally.  Heart: Regular rate and rhythm, no murmurs rubs or gallops.  Abdomen: Bowel sounds are normal, nontender, nondistended, no hepatosplenomegaly or masses, no abdominal bruits or hernia , no rebound or guarding.   Extremities: No lower extremity edema. No clubbing or deformities. Neuro: Alert and oriented x 4   Skin: Warm and dry, no jaundice.   Psych: Alert and cooperative, normal mood and affect.

## 2017-07-26 NOTE — Assessment & Plan Note (Signed)
Active diverticulitis found at time of screening colonoscopy.  Treated with Cipro and Flagyl, clinically doing well.  Will be due for next TCS in 5 years. Discussed high fiber diet for management of diverticulosis.  Would recommend adding Benefiber 2 teaspoons twice daily for diverticulosis as well as management of her mild constipation.  If needed she may use MiraLAX 17 g at bedtime on days that she does not have an adequate BM.  Return to the office as needed.

## 2017-07-26 NOTE — Progress Notes (Signed)
cc'd to pcp 

## 2017-09-19 DIAGNOSIS — I10 Essential (primary) hypertension: Secondary | ICD-10-CM | POA: Diagnosis not present

## 2017-09-19 DIAGNOSIS — Z23 Encounter for immunization: Secondary | ICD-10-CM | POA: Diagnosis not present

## 2017-09-28 DIAGNOSIS — E039 Hypothyroidism, unspecified: Secondary | ICD-10-CM | POA: Diagnosis not present

## 2017-09-28 DIAGNOSIS — Z1211 Encounter for screening for malignant neoplasm of colon: Secondary | ICD-10-CM | POA: Diagnosis not present

## 2017-09-28 DIAGNOSIS — I10 Essential (primary) hypertension: Secondary | ICD-10-CM | POA: Diagnosis not present

## 2017-09-28 DIAGNOSIS — E785 Hyperlipidemia, unspecified: Secondary | ICD-10-CM | POA: Diagnosis not present

## 2017-10-03 DIAGNOSIS — Z0001 Encounter for general adult medical examination with abnormal findings: Secondary | ICD-10-CM | POA: Diagnosis not present

## 2017-12-27 DIAGNOSIS — I10 Essential (primary) hypertension: Secondary | ICD-10-CM | POA: Diagnosis not present

## 2017-12-27 DIAGNOSIS — E039 Hypothyroidism, unspecified: Secondary | ICD-10-CM | POA: Diagnosis not present

## 2017-12-29 DIAGNOSIS — E039 Hypothyroidism, unspecified: Secondary | ICD-10-CM | POA: Diagnosis not present

## 2017-12-29 DIAGNOSIS — E1165 Type 2 diabetes mellitus with hyperglycemia: Secondary | ICD-10-CM | POA: Diagnosis not present

## 2017-12-29 DIAGNOSIS — E782 Mixed hyperlipidemia: Secondary | ICD-10-CM | POA: Diagnosis not present

## 2018-01-12 DIAGNOSIS — E119 Type 2 diabetes mellitus without complications: Secondary | ICD-10-CM | POA: Diagnosis not present

## 2018-01-12 DIAGNOSIS — Z6838 Body mass index (BMI) 38.0-38.9, adult: Secondary | ICD-10-CM | POA: Diagnosis not present

## 2018-02-11 DIAGNOSIS — Z6839 Body mass index (BMI) 39.0-39.9, adult: Secondary | ICD-10-CM | POA: Diagnosis not present

## 2018-02-11 DIAGNOSIS — M25532 Pain in left wrist: Secondary | ICD-10-CM | POA: Diagnosis not present

## 2018-02-11 DIAGNOSIS — M79642 Pain in left hand: Secondary | ICD-10-CM | POA: Diagnosis not present

## 2018-04-20 DIAGNOSIS — Z6839 Body mass index (BMI) 39.0-39.9, adult: Secondary | ICD-10-CM | POA: Diagnosis not present

## 2018-04-20 DIAGNOSIS — M25532 Pain in left wrist: Secondary | ICD-10-CM | POA: Diagnosis not present

## 2018-04-20 DIAGNOSIS — E1165 Type 2 diabetes mellitus with hyperglycemia: Secondary | ICD-10-CM | POA: Diagnosis not present

## 2018-04-20 DIAGNOSIS — E782 Mixed hyperlipidemia: Secondary | ICD-10-CM | POA: Diagnosis not present

## 2018-04-20 DIAGNOSIS — M79642 Pain in left hand: Secondary | ICD-10-CM | POA: Diagnosis not present

## 2018-04-20 DIAGNOSIS — E039 Hypothyroidism, unspecified: Secondary | ICD-10-CM | POA: Diagnosis not present

## 2018-04-20 DIAGNOSIS — E119 Type 2 diabetes mellitus without complications: Secondary | ICD-10-CM | POA: Diagnosis not present

## 2018-04-24 DIAGNOSIS — E039 Hypothyroidism, unspecified: Secondary | ICD-10-CM | POA: Diagnosis not present

## 2018-04-24 DIAGNOSIS — I10 Essential (primary) hypertension: Secondary | ICD-10-CM | POA: Diagnosis not present

## 2018-04-24 DIAGNOSIS — E782 Mixed hyperlipidemia: Secondary | ICD-10-CM | POA: Diagnosis not present

## 2018-04-24 DIAGNOSIS — E1165 Type 2 diabetes mellitus with hyperglycemia: Secondary | ICD-10-CM | POA: Diagnosis not present

## 2018-08-14 DIAGNOSIS — J029 Acute pharyngitis, unspecified: Secondary | ICD-10-CM | POA: Diagnosis not present

## 2018-08-14 DIAGNOSIS — J060 Acute laryngopharyngitis: Secondary | ICD-10-CM | POA: Diagnosis not present

## 2019-02-08 DIAGNOSIS — E782 Mixed hyperlipidemia: Secondary | ICD-10-CM | POA: Diagnosis not present

## 2019-02-08 DIAGNOSIS — E1165 Type 2 diabetes mellitus with hyperglycemia: Secondary | ICD-10-CM | POA: Diagnosis not present

## 2019-02-08 DIAGNOSIS — I10 Essential (primary) hypertension: Secondary | ICD-10-CM | POA: Diagnosis not present

## 2019-02-08 DIAGNOSIS — E119 Type 2 diabetes mellitus without complications: Secondary | ICD-10-CM | POA: Diagnosis not present

## 2019-02-09 DIAGNOSIS — I10 Essential (primary) hypertension: Secondary | ICD-10-CM | POA: Diagnosis not present

## 2019-02-09 DIAGNOSIS — Z0001 Encounter for general adult medical examination with abnormal findings: Secondary | ICD-10-CM | POA: Diagnosis not present

## 2019-02-09 DIAGNOSIS — E782 Mixed hyperlipidemia: Secondary | ICD-10-CM | POA: Diagnosis not present

## 2019-02-09 DIAGNOSIS — E1169 Type 2 diabetes mellitus with other specified complication: Secondary | ICD-10-CM | POA: Diagnosis not present

## 2019-02-26 ENCOUNTER — Other Ambulatory Visit: Payer: Self-pay | Admitting: Internal Medicine

## 2019-02-26 DIAGNOSIS — Z780 Asymptomatic menopausal state: Secondary | ICD-10-CM

## 2019-02-26 DIAGNOSIS — E2839 Other primary ovarian failure: Secondary | ICD-10-CM

## 2019-05-09 DIAGNOSIS — E1169 Type 2 diabetes mellitus with other specified complication: Secondary | ICD-10-CM | POA: Diagnosis not present

## 2019-05-09 DIAGNOSIS — J069 Acute upper respiratory infection, unspecified: Secondary | ICD-10-CM | POA: Diagnosis not present

## 2019-08-14 DIAGNOSIS — E1165 Type 2 diabetes mellitus with hyperglycemia: Secondary | ICD-10-CM | POA: Diagnosis not present

## 2019-08-14 DIAGNOSIS — E782 Mixed hyperlipidemia: Secondary | ICD-10-CM | POA: Diagnosis not present

## 2019-08-14 DIAGNOSIS — I10 Essential (primary) hypertension: Secondary | ICD-10-CM | POA: Diagnosis not present

## 2019-08-14 DIAGNOSIS — E1169 Type 2 diabetes mellitus with other specified complication: Secondary | ICD-10-CM | POA: Diagnosis not present

## 2019-08-20 DIAGNOSIS — E782 Mixed hyperlipidemia: Secondary | ICD-10-CM | POA: Diagnosis not present

## 2019-08-20 DIAGNOSIS — I10 Essential (primary) hypertension: Secondary | ICD-10-CM | POA: Diagnosis not present

## 2019-08-20 DIAGNOSIS — E039 Hypothyroidism, unspecified: Secondary | ICD-10-CM | POA: Diagnosis not present

## 2019-08-20 DIAGNOSIS — E1169 Type 2 diabetes mellitus with other specified complication: Secondary | ICD-10-CM | POA: Diagnosis not present

## 2019-10-10 DIAGNOSIS — M25561 Pain in right knee: Secondary | ICD-10-CM | POA: Diagnosis not present

## 2019-10-29 DIAGNOSIS — M25561 Pain in right knee: Secondary | ICD-10-CM | POA: Diagnosis not present

## 2019-10-31 DIAGNOSIS — S83241A Other tear of medial meniscus, current injury, right knee, initial encounter: Secondary | ICD-10-CM | POA: Diagnosis not present

## 2019-10-31 DIAGNOSIS — M1711 Unilateral primary osteoarthritis, right knee: Secondary | ICD-10-CM | POA: Diagnosis not present

## 2020-01-23 DIAGNOSIS — E119 Type 2 diabetes mellitus without complications: Secondary | ICD-10-CM | POA: Diagnosis not present

## 2020-01-23 DIAGNOSIS — E039 Hypothyroidism, unspecified: Secondary | ICD-10-CM | POA: Diagnosis not present

## 2020-01-23 DIAGNOSIS — E1165 Type 2 diabetes mellitus with hyperglycemia: Secondary | ICD-10-CM | POA: Diagnosis not present

## 2020-01-23 DIAGNOSIS — E1169 Type 2 diabetes mellitus with other specified complication: Secondary | ICD-10-CM | POA: Diagnosis not present

## 2020-02-01 ENCOUNTER — Other Ambulatory Visit (HOSPITAL_COMMUNITY): Payer: Self-pay | Admitting: Adult Health Nurse Practitioner

## 2020-02-01 ENCOUNTER — Other Ambulatory Visit (HOSPITAL_BASED_OUTPATIENT_CLINIC_OR_DEPARTMENT_OTHER): Payer: Self-pay | Admitting: Internal Medicine

## 2020-02-01 ENCOUNTER — Other Ambulatory Visit (HOSPITAL_COMMUNITY): Payer: Self-pay | Admitting: Internal Medicine

## 2020-02-01 DIAGNOSIS — Z1231 Encounter for screening mammogram for malignant neoplasm of breast: Secondary | ICD-10-CM

## 2020-02-01 DIAGNOSIS — E1169 Type 2 diabetes mellitus with other specified complication: Secondary | ICD-10-CM | POA: Diagnosis not present

## 2020-02-01 DIAGNOSIS — E782 Mixed hyperlipidemia: Secondary | ICD-10-CM | POA: Diagnosis not present

## 2020-02-01 DIAGNOSIS — E039 Hypothyroidism, unspecified: Secondary | ICD-10-CM | POA: Diagnosis not present

## 2020-02-01 DIAGNOSIS — E2839 Other primary ovarian failure: Secondary | ICD-10-CM

## 2020-02-01 DIAGNOSIS — I10 Essential (primary) hypertension: Secondary | ICD-10-CM | POA: Diagnosis not present

## 2020-02-14 ENCOUNTER — Ambulatory Visit (HOSPITAL_COMMUNITY): Admission: RE | Admit: 2020-02-14 | Payer: Self-pay | Source: Ambulatory Visit

## 2020-05-12 ENCOUNTER — Other Ambulatory Visit (HOSPITAL_COMMUNITY): Payer: Self-pay | Admitting: Internal Medicine

## 2020-05-12 DIAGNOSIS — Z1231 Encounter for screening mammogram for malignant neoplasm of breast: Secondary | ICD-10-CM

## 2020-05-14 ENCOUNTER — Other Ambulatory Visit: Payer: Self-pay

## 2020-05-14 ENCOUNTER — Ambulatory Visit (HOSPITAL_COMMUNITY)
Admission: RE | Admit: 2020-05-14 | Discharge: 2020-05-14 | Disposition: A | Discharge status: Home / Self Care | Payer: BC Managed Care – PPO | Source: Ambulatory Visit | Attending: Internal Medicine | Admitting: Internal Medicine

## 2020-05-14 DIAGNOSIS — Z1231 Encounter for screening mammogram for malignant neoplasm of breast: Secondary | ICD-10-CM | POA: Diagnosis not present

## 2020-06-02 DIAGNOSIS — J069 Acute upper respiratory infection, unspecified: Secondary | ICD-10-CM | POA: Diagnosis not present

## 2020-07-16 DIAGNOSIS — Z01419 Encounter for gynecological examination (general) (routine) without abnormal findings: Secondary | ICD-10-CM | POA: Diagnosis not present

## 2020-07-16 DIAGNOSIS — Z6837 Body mass index (BMI) 37.0-37.9, adult: Secondary | ICD-10-CM | POA: Diagnosis not present

## 2020-07-25 DIAGNOSIS — J069 Acute upper respiratory infection, unspecified: Secondary | ICD-10-CM | POA: Diagnosis not present

## 2020-07-25 DIAGNOSIS — J060 Acute laryngopharyngitis: Secondary | ICD-10-CM | POA: Diagnosis not present

## 2020-07-25 DIAGNOSIS — Z712 Person consulting for explanation of examination or test findings: Secondary | ICD-10-CM | POA: Diagnosis not present

## 2020-07-25 DIAGNOSIS — J029 Acute pharyngitis, unspecified: Secondary | ICD-10-CM | POA: Diagnosis not present

## 2020-07-25 DIAGNOSIS — N939 Abnormal uterine and vaginal bleeding, unspecified: Secondary | ICD-10-CM | POA: Diagnosis not present

## 2020-07-29 DIAGNOSIS — M545 Low back pain, unspecified: Secondary | ICD-10-CM | POA: Diagnosis not present

## 2020-07-29 DIAGNOSIS — N939 Abnormal uterine and vaginal bleeding, unspecified: Secondary | ICD-10-CM | POA: Diagnosis not present

## 2020-07-29 DIAGNOSIS — F411 Generalized anxiety disorder: Secondary | ICD-10-CM | POA: Diagnosis not present

## 2020-08-06 DIAGNOSIS — R5383 Other fatigue: Secondary | ICD-10-CM | POA: Diagnosis not present

## 2020-08-11 DIAGNOSIS — J060 Acute laryngopharyngitis: Secondary | ICD-10-CM | POA: Diagnosis not present

## 2020-08-11 DIAGNOSIS — I10 Essential (primary) hypertension: Secondary | ICD-10-CM | POA: Diagnosis not present

## 2020-08-11 DIAGNOSIS — Z712 Person consulting for explanation of examination or test findings: Secondary | ICD-10-CM | POA: Diagnosis not present

## 2020-08-11 DIAGNOSIS — E1169 Type 2 diabetes mellitus with other specified complication: Secondary | ICD-10-CM | POA: Diagnosis not present

## 2020-08-11 DIAGNOSIS — J029 Acute pharyngitis, unspecified: Secondary | ICD-10-CM | POA: Diagnosis not present

## 2020-08-11 DIAGNOSIS — R5383 Other fatigue: Secondary | ICD-10-CM | POA: Diagnosis not present

## 2020-08-11 DIAGNOSIS — J069 Acute upper respiratory infection, unspecified: Secondary | ICD-10-CM | POA: Diagnosis not present

## 2020-08-12 DIAGNOSIS — R310 Gross hematuria: Secondary | ICD-10-CM | POA: Diagnosis not present

## 2020-08-12 DIAGNOSIS — E1169 Type 2 diabetes mellitus with other specified complication: Secondary | ICD-10-CM | POA: Diagnosis not present

## 2020-08-12 DIAGNOSIS — Z23 Encounter for immunization: Secondary | ICD-10-CM | POA: Diagnosis not present

## 2020-08-12 DIAGNOSIS — I10 Essential (primary) hypertension: Secondary | ICD-10-CM | POA: Diagnosis not present

## 2020-08-12 DIAGNOSIS — R5383 Other fatigue: Secondary | ICD-10-CM | POA: Diagnosis not present

## 2020-08-19 ENCOUNTER — Other Ambulatory Visit: Payer: Self-pay | Admitting: Internal Medicine

## 2020-08-19 ENCOUNTER — Other Ambulatory Visit (HOSPITAL_COMMUNITY): Payer: Self-pay | Admitting: Internal Medicine

## 2020-08-19 DIAGNOSIS — R319 Hematuria, unspecified: Secondary | ICD-10-CM

## 2020-08-20 ENCOUNTER — Other Ambulatory Visit (HOSPITAL_COMMUNITY): Payer: Self-pay | Admitting: Adult Health Nurse Practitioner

## 2020-08-20 ENCOUNTER — Other Ambulatory Visit: Payer: Self-pay | Admitting: Adult Health Nurse Practitioner

## 2020-08-20 ENCOUNTER — Other Ambulatory Visit: Payer: Self-pay | Admitting: Internal Medicine

## 2020-08-21 ENCOUNTER — Other Ambulatory Visit (HOSPITAL_COMMUNITY): Payer: Self-pay | Admitting: Internal Medicine

## 2020-08-21 ENCOUNTER — Other Ambulatory Visit: Payer: Self-pay | Admitting: Internal Medicine

## 2020-08-21 DIAGNOSIS — R319 Hematuria, unspecified: Secondary | ICD-10-CM

## 2020-09-04 ENCOUNTER — Ambulatory Visit (HOSPITAL_COMMUNITY)
Admission: RE | Admit: 2020-09-04 | Discharge: 2020-09-04 | Disposition: A | Discharge status: Home / Self Care | Payer: BC Managed Care – PPO | Source: Ambulatory Visit | Attending: Internal Medicine | Admitting: Internal Medicine

## 2020-09-04 ENCOUNTER — Other Ambulatory Visit: Payer: Self-pay

## 2020-09-04 DIAGNOSIS — K5730 Diverticulosis of large intestine without perforation or abscess without bleeding: Secondary | ICD-10-CM | POA: Diagnosis not present

## 2020-09-04 DIAGNOSIS — R319 Hematuria, unspecified: Secondary | ICD-10-CM | POA: Diagnosis not present

## 2020-09-04 DIAGNOSIS — N1330 Unspecified hydronephrosis: Secondary | ICD-10-CM | POA: Diagnosis not present

## 2020-09-04 DIAGNOSIS — I700 Atherosclerosis of aorta: Secondary | ICD-10-CM | POA: Diagnosis not present

## 2020-09-04 DIAGNOSIS — K429 Umbilical hernia without obstruction or gangrene: Secondary | ICD-10-CM | POA: Diagnosis not present

## 2020-09-04 MED ORDER — IOHEXOL 300 MG/ML  SOLN
125.0000 mL | Freq: Once | INTRAMUSCULAR | Status: AC | PRN
  Administered 2020-09-04: 125 mL via INTRAVENOUS

## 2020-09-09 LAB — POCT I-STAT CREATININE: Creatinine, Ser: 0.6 mg/dL (ref 0.44–1.00)

## 2020-09-10 ENCOUNTER — Encounter (HOSPITAL_COMMUNITY): Payer: Self-pay

## 2020-09-10 ENCOUNTER — Ambulatory Visit (HOSPITAL_COMMUNITY): Payer: BC Managed Care – PPO

## 2020-09-22 ENCOUNTER — Encounter: Payer: Self-pay | Admitting: Urology

## 2020-09-22 ENCOUNTER — Other Ambulatory Visit: Payer: Self-pay

## 2020-09-22 ENCOUNTER — Ambulatory Visit (INDEPENDENT_AMBULATORY_CARE_PROVIDER_SITE_OTHER): Payer: BC Managed Care – PPO | Admitting: Urology

## 2020-09-22 VITALS — BP 151/84 | HR 73 | Temp 98.5°F | Ht 63.0 in | Wt 209.0 lb

## 2020-09-22 DIAGNOSIS — N362 Urethral caruncle: Secondary | ICD-10-CM | POA: Diagnosis not present

## 2020-09-22 DIAGNOSIS — R310 Gross hematuria: Secondary | ICD-10-CM | POA: Diagnosis not present

## 2020-09-22 DIAGNOSIS — N131 Hydronephrosis with ureteral stricture, not elsewhere classified: Secondary | ICD-10-CM | POA: Diagnosis not present

## 2020-09-22 LAB — MICROSCOPIC EXAMINATION
RBC: >30 /hpf — AB (ref 0–2)
Renal Epithel, UA: NONE SEEN /hpf

## 2020-09-22 LAB — URINALYSIS, ROUTINE W REFLEX MICROSCOPIC
Bilirubin, UA: NEGATIVE
Ketones, UA: NEGATIVE
Nitrite, UA: NEGATIVE
Specific Gravity, UA: 1.015 (ref 1.005–1.030)
Urobilinogen, Ur: 0.2 mg/dL (ref 0.2–1.0)
pH, UA: 5.5 (ref 5.0–7.5)

## 2020-09-22 NOTE — Progress Notes (Signed)
09/22/2020 11:20 AM   Sophia Young 12-31-1955 [id number]  Referring provider: Celene Squibb, MD [address] [name],  [place] [address]  Gross Hematuria  HPI: Sophia Young is a 65yo here for evaluation of gross hematuria. For the past 5-6 months she has noted grossly bloody urine 3 times. She does note blood when she wipes after urinating. She underwent CT abd/pelvis in 08/2020 which showed a left UPJ obstruction. She denies any left flank pain. She does no get frequent UTI. She has a hx of nephrolithiasis require stent placement and ureteroscopy.  UA today is concerning for infection. She saw a gynecologist and noted to have a normal pelvic exam.    PMH: Past Medical History:  Diagnosis Date  . Chronic back pain   . Diabetes mellitus without complication ([place])   . Hydronephrosis of left kidney    chronic  . Hypercholesteremia   . Hypertension   . Ureteral stricture, left     Surgical History: Past Surgical History:  Procedure Laterality Date  . ABDOMINAL HYSTERECTOMY    . BACK SURGERY    . BIOPSY  06/01/2017   Procedure: BIOPSY;  Surgeon: Daneil Dolin, MD;  [facility];  Service: Endoscopy;;  colon  . CHOLECYSTECTOMY    . COLONOSCOPY N/A 06/01/2017   Procedure: COLONOSCOPY;  Surgeon: Daneil Dolin, MD;  [facility]: [facility];  Service: Endoscopy;  Laterality: N/A;  7:30 AM  . POLYPECTOMY  06/01/2017   Procedure: POLYPECTOMY;  Surgeon: Daneil Dolin, MD;  Location: [facility];  Service: Endoscopy;;  colon   . SP DIL URETER Left 2011  . URETERAL STENT PLACEMENT Left 2011    Home Medications:  Allergies as of 09/22/2020   No Known Allergies     Medication List       Accurate as of September 22, 2020 11:20 AM. If you have any questions, ask your nurse or doctor.        STOP taking these medications   glipiZIDE 5 MG 24 hr tablet Commonly known as: GLUCOTROL XL Stopped by: Nicolette Bang, MD   metformin 1000 MG (OSM) 24 hr  tablet Commonly known as: FORTAMET Stopped by: Nicolette Bang, MD     TAKE these medications   aspirin 81 MG chewable tablet Chew by mouth daily. What changed: Another medication with the same name was removed. Continue taking this medication, and follow the directions you see here. Changed by: Nicolette Bang, MD   hydrOXYzine 10 MG tablet Commonly known as: ATARAX/VISTARIL Take 10 mg by mouth at bedtime.   levothyroxine 75 MCG tablet Commonly known as: SYNTHROID Take 75 mcg by mouth daily before breakfast.   losartan 50 MG tablet Commonly known as: COZAAR Take 50 mg by mouth daily. What changed: Another medication with the same name was removed. Continue taking this medication, and follow the directions you see here. Changed by: Nicolette Bang, MD   pravastatin 40 MG tablet Commonly known as: PRAVACHOL Take 40 mg by mouth daily.   triamterene-hydrochlorothiazide 37.5-25 MG tablet Commonly known as: MAXZIDE-25 Take 1 tablet by mouth daily.   triamterene-hydrochlorothiazide 37.5-25 MG capsule Commonly known as: DYAZIDE Take 1 capsule by mouth daily.   Xigduo XR 06-999 MG Tb24 Generic drug: Dapagliflozin-metFORMIN HCl ER Take 1 tablet by mouth daily.       Allergies: No Known Allergies  Family History: Family History  Problem Relation Age of Onset  . Colon cancer Neg Hx  Social History:  reports that she has quit smoking. She has a 1.50 pack-year smoking history. She has never used smokeless tobacco. She reports that she does not drink alcohol and does not use drugs.  ROS: All other review of systems were reviewed and are negative except what is noted above in HPI  Physical Exam: BP (!) 151/84   Pulse 73   Temp 98.5 F (36.9 C)   Ht 5\' 3"  (1.6 m)   Wt 209 lb (94.8 kg)   BMI 37.02 kg/m   Constitutional:  Alert and oriented, No acute distress. HEENT: [place] AT, moist mucus membranes.  Trachea midline, no masses. Cardiovascular: No clubbing,  cyanosis, or edema. Respiratory: Normal respiratory effort, no increased work of breathing. GI: Abdomen is soft, nontender, nondistended, no abdominal masses GU: No CVA tenderness. Mild vaginal atrophy, no cystocele. Urethral caruncle at 5 oclock Lymph: No cervical or inguinal lymphadenopathy. Skin: No rashes, bruises or suspicious lesions. Neurologic: Grossly intact, no focal deficits, moving all 4 extremities. Psychiatric: Normal mood and affect.  Laboratory Data: Lab Results  Component Value Date   WBC 7.7 02/13/2010   HGB 13.0 02/13/2010   HCT 37.4 02/13/2010   MCV 90.6 02/13/2010   PLT 200 02/13/2010    Lab Results  Component Value Date   CREATININE 0.60 09/04/2020    No results found for: PSA  No results found for: TESTOSTERONE  No results found for: HGBA1C  Urinalysis    Component Value Date/Time   COLORURINE RED BIOCHEMICALS MAY BE AFFECTED BY COLOR (A) 01/22/2010 0859   APPEARANCEUR CLOUDY (A) 01/22/2010 0859   LABSPEC 1.020 01/22/2010 0859   PHURINE 7.0 01/22/2010 0859   GLUCOSEU 100 (A) 01/22/2010 0859   HGBUR LARGE (A) 01/22/2010 0859   BILIRUBINUR MODERATE (A) 01/22/2010 0859   KETONESUR 15 (A) 01/22/2010 0859   PROTEINUR >300 (A) 01/22/2010 0859   UROBILINOGEN 4.0 (H) 01/22/2010 0859   NITRITE POSITIVE (A) 01/22/2010 0859   LEUKOCYTESUR MODERATE (A) 01/22/2010 0859    Lab Results  Component Value Date   BACTERIA MANY (A) 01/22/2010    Pertinent Imaging: CT abd/pelvis 09/04/2020: Images reviewed and discussed with the patient Results for orders placed during the hospital encounter of 02/10/10  DG Abd 1 View  Narrative Clinical Data: Left renal calculus  ABDOMEN - 1 VIEW  Comparison: None Correlation:  CT abdomen pelvis 01/22/2010  Findings: Surgical clip left pelvis. Elongated calcification left pelvis, corresponding to a vascular calcification seen on preceding CT. Facet degenerative changes lower lumbar spine. No definite urinary  tract calcification identified. Bowel gas pattern normal. No acute bony findings.  IMPRESSION: No definite urinary tract calcification identified.  Provider: Jennye Boroughs  No results found for this or any previous visit.  No results found for this or any previous visit.  No results found for this or any previous visit.  No results found for this or any previous visit.  No results found for this or any previous visit.  No results found for this or any previous visit.  No results found for this or any previous visit.   Assessment & Plan:    1. Gross hematuria -Likely related to urethral caruncle - Urinalysis, Routine w reflex microscopic  2. Left hydronephrosis/possible UPJ obstruction -We discussed the management of the patients hydronephrosis. In the setting of gross hematuria I would recommend diagnostic ureteroscopy to rule out malignancy. After discussing the procedure the patient wishes to proceed with left diagnostic ureteroscopy. Risks/benefits/alternatives discussed.    No  follow-ups on file.  Nicolette Bang, MD  [facility] [place]

## 2020-09-22 NOTE — Progress Notes (Signed)
Urological Symptom Review  Patient is experiencing the following symptoms: Get up at night to urinate Vaginal bleeding (female only)  Stream starts and stops Weak stream (sometimes)   Review of Systems  Gastrointestinal (upper)  : Negative for upper GI symptoms  Gastrointestinal (lower) : Negative for lower GI symptoms  Constitutional : Negative for symptoms  Skin: Negative for skin symptoms  Eyes: Negative for eye symptoms  Ear/Nose/Throat : Negative for Ear/Nose/Throat symptoms  Hematologic/Lymphatic: Negative for Hematologic/Lymphatic symptoms  Cardiovascular : Negative for cardiovascular symptoms  Respiratory : Negative for respiratory symptoms  Endocrine: Negative for endocrine symptoms  Musculoskeletal: Negative for musculoskeletal symptoms  Neurological: Negative for neurological symptoms  Psychologic: Negative for psychiatric symptoms

## 2020-09-23 ENCOUNTER — Encounter: Payer: Self-pay | Admitting: Urology

## 2020-09-24 LAB — URINE CULTURE

## 2020-09-30 ENCOUNTER — Telehealth: Payer: Self-pay

## 2020-09-30 NOTE — Telephone Encounter (Signed)
-----   Message from Cleon Gustin, MD sent at 09/30/2020  1:27 PM EST ----- negative ----- Message ----- From: Dorisann Frames, RN Sent: 09/24/2020   8:37 AM EST To: Cleon Gustin, MD  Please review

## 2020-09-30 NOTE — Telephone Encounter (Signed)
Pt notified of results

## 2020-10-03 ENCOUNTER — Ambulatory Visit: Payer: BC Managed Care – PPO | Admitting: Urology

## 2020-10-21 ENCOUNTER — Other Ambulatory Visit (HOSPITAL_COMMUNITY): Payer: BC Managed Care – PPO

## 2020-10-26 ENCOUNTER — Emergency Department (HOSPITAL_COMMUNITY)
Admission: EM | Admit: 2020-10-26 | Discharge: 2020-10-26 | Disposition: A | Discharge status: Home / Self Care | Payer: BC Managed Care – PPO | Attending: Emergency Medicine | Admitting: Emergency Medicine

## 2020-10-26 ENCOUNTER — Other Ambulatory Visit: Payer: Self-pay

## 2020-10-26 ENCOUNTER — Encounter (HOSPITAL_COMMUNITY): Payer: Self-pay | Admitting: *Deleted

## 2020-10-26 DIAGNOSIS — Z79899 Other long term (current) drug therapy: Secondary | ICD-10-CM | POA: Diagnosis not present

## 2020-10-26 DIAGNOSIS — Z7984 Long term (current) use of oral hypoglycemic drugs: Secondary | ICD-10-CM | POA: Diagnosis not present

## 2020-10-26 DIAGNOSIS — N3090 Cystitis, unspecified without hematuria: Secondary | ICD-10-CM | POA: Diagnosis not present

## 2020-10-26 DIAGNOSIS — Z87891 Personal history of nicotine dependence: Secondary | ICD-10-CM | POA: Diagnosis not present

## 2020-10-26 DIAGNOSIS — I10 Essential (primary) hypertension: Secondary | ICD-10-CM | POA: Insufficient documentation

## 2020-10-26 DIAGNOSIS — N23 Unspecified renal colic: Secondary | ICD-10-CM | POA: Insufficient documentation

## 2020-10-26 DIAGNOSIS — N390 Urinary tract infection, site not specified: Secondary | ICD-10-CM | POA: Diagnosis not present

## 2020-10-26 DIAGNOSIS — R109 Unspecified abdominal pain: Secondary | ICD-10-CM | POA: Diagnosis not present

## 2020-10-26 DIAGNOSIS — Z7982 Long term (current) use of aspirin: Secondary | ICD-10-CM | POA: Diagnosis not present

## 2020-10-26 DIAGNOSIS — E119 Type 2 diabetes mellitus without complications: Secondary | ICD-10-CM | POA: Insufficient documentation

## 2020-10-26 DIAGNOSIS — N200 Calculus of kidney: Secondary | ICD-10-CM | POA: Diagnosis not present

## 2020-10-26 LAB — CBC WITH DIFFERENTIAL/PLATELET
Abs Immature Granulocytes: 0.12 10*3/uL — ABNORMAL HIGH (ref 0.00–0.07)
Basophils Absolute: 0.1 10*3/uL (ref 0.0–0.1)
Basophils Relative: 0 %
Eosinophils Absolute: 0 10*3/uL (ref 0.0–0.5)
Eosinophils Relative: 0 %
HCT: 45.2 % (ref 36.0–46.0)
Hemoglobin: 15.6 g/dL — ABNORMAL HIGH (ref 12.0–15.0)
Immature Granulocytes: 1 %
Lymphocytes Relative: 11 %
Lymphs Abs: 1.9 10*3/uL (ref 0.7–4.0)
MCH: 30.8 pg (ref 26.0–34.0)
MCHC: 34.5 g/dL (ref 30.0–36.0)
MCV: 89.3 fL (ref 80.0–100.0)
Monocytes Absolute: 1 10*3/uL (ref 0.1–1.0)
Monocytes Relative: 6 %
Neutro Abs: 13.8 10*3/uL — ABNORMAL HIGH (ref 1.7–7.7)
Neutrophils Relative %: 82 %
Platelets: 206 10*3/uL (ref 150–400)
RBC: 5.06 MIL/uL (ref 3.87–5.11)
RDW: 13.7 % (ref 11.5–15.5)
WBC: 16.8 10*3/uL — ABNORMAL HIGH (ref 4.0–10.5)
nRBC: 0 % (ref 0.0–0.2)

## 2020-10-26 LAB — URINALYSIS, ROUTINE W REFLEX MICROSCOPIC
Bilirubin Urine: NEGATIVE
Glucose, UA: >=500 mg/dL — AB
Ketones, ur: NEGATIVE mg/dL
Nitrite: NEGATIVE
Protein, ur: >=300 mg/dL — AB
RBC / HPF: >50 RBC/hpf — ABNORMAL HIGH (ref 0–5)
Specific Gravity, Urine: 1.023 (ref 1.005–1.030)
WBC, UA: >50 WBC/hpf — ABNORMAL HIGH (ref 0–5)
pH: 6 (ref 5.0–8.0)

## 2020-10-26 LAB — BASIC METABOLIC PANEL
Anion gap: 13 (ref 5–15)
BUN: 14 mg/dL (ref 8–23)
CO2: 26 mmol/L (ref 22–32)
Calcium: 9.3 mg/dL (ref 8.9–10.3)
Chloride: 97 mmol/L — ABNORMAL LOW (ref 98–111)
Creatinine, Ser: 0.73 mg/dL (ref 0.44–1.00)
GFR, Estimated: >60 mL/min (ref >60)
Glucose, Bld: 191 mg/dL — ABNORMAL HIGH (ref 70–99)
Potassium: 3.6 mmol/L (ref 3.5–5.1)
Sodium: 136 mmol/L (ref 135–145)

## 2020-10-26 MED ORDER — CEFTRIAXONE SODIUM 1 G IJ SOLR
1.0000 g | Freq: Once | INTRAMUSCULAR | Status: DC
  Filled 2020-10-26: qty 10

## 2020-10-26 MED ORDER — CEPHALEXIN 500 MG PO CAPS: 500.0000 mg | ORAL_CAPSULE | Freq: Three times a day (TID) | ORAL | 0 refills | Status: DC

## 2020-10-26 MED ORDER — SODIUM CHLORIDE 0.9 % IV SOLN
1.0000 g | Freq: Once | INTRAVENOUS | Status: AC
  Administered 2020-10-26: 1 g via INTRAVENOUS
  Filled 2020-10-26: qty 10

## 2020-10-26 NOTE — Discharge Instructions (Addendum)
Take the medication as prescribed for suspected UTI.  It does appear that you likely have passed a stone -but that is not a guarantee as we did not get a CT scan to confirm.  If you start having severe pain again, take ibuprofen 600 mg.  If the pain persist, return to the ER.  Follow-up with the urologist as planned.

## 2020-10-26 NOTE — ED Provider Notes (Signed)
[facility] DEPT Provider Note   CSN: [id number] Arrival date & time: 10/26/20  1019     History Chief Complaint  Patient presents with  . Flank Pain    Sophia Young is a 65 y.o. female.  HPI    65 year old female comes in with chief complaint of flank pain.  Patient has history of diabetes, chronic hydronephrosis of the left kidney with kidney stones.  She is suspected to have anatomical defect, going to get stent placed at some point in her left kidney.  Left flank pain woke her up from her sleep.  She had severe pain prior to ED arrival.  While waiting in the ER, the pain started getting better and has now resolved.  Patient had 2 voids since arriving to the ER, she thinks she might of passed the stone.  Review of system is also positive for burning with urination that started 3 days ago.  She denies any fevers, chills.  Past Medical History:  Diagnosis Date  . Chronic back pain   . Diabetes mellitus without complication ([place])   . Hydronephrosis of left kidney    chronic  . Hypercholesteremia   . Hypertension   . Ureteral stricture, left     Patient Active Problem List   Diagnosis Date Noted  . H/O diverticulitis of colon 07/26/2017    Past Surgical History:  Procedure Laterality Date  . ABDOMINAL HYSTERECTOMY    . BACK SURGERY    . BIOPSY  06/01/2017   Procedure: BIOPSY;  Surgeon: Daneil Dolin, MD;  Location: [facility];  Service: Endoscopy;;  colon  . CHOLECYSTECTOMY    . COLONOSCOPY N/A 06/01/2017   Procedure: COLONOSCOPY;  Surgeon: Daneil Dolin, MD;  Location: [facility];  Service: Endoscopy;  Laterality: N/A;  7:30 AM  . POLYPECTOMY  06/01/2017   Procedure: POLYPECTOMY;  Surgeon: Daneil Dolin, MD;  Location: [facility];  Service: Endoscopy;;  colon   . SP DIL URETER Left 2011  . URETERAL STENT PLACEMENT Left 2011     OB History   No obstetric history on file.     Family History  Problem Relation Age of  Onset  . Colon cancer Neg Hx     Social History   Tobacco Use  . Smoking status: Former Smoker    Packs/day: 0.50    Years: 3.00    Pack years: 1.50  . Smokeless tobacco: Never Used  Vaping Use  . Vaping Use: Never used  Substance Use Topics  . Alcohol use: No  . Drug use: No    Home Medications Prior to Admission medications   Medication Sig Start Date End Date Taking? Authorizing Provider  cephALEXin (KEFLEX) 500 MG capsule Take 1 capsule (500 mg total) by mouth 3 (three) times daily. 10/26/20  Yes Varney Biles, MD  aspirin 81 MG chewable tablet Chew by mouth daily.    [provider]  hydrOXYzine (ATARAX/VISTARIL) 10 MG tablet Take 10 mg by mouth at bedtime. 08/26/20   [provider]  levothyroxine (SYNTHROID, LEVOTHROID) 75 MCG tablet Take 75 mcg by mouth daily before breakfast.    [provider]  losartan (COZAAR) 50 MG tablet Take 50 mg by mouth daily.    [provider]  pravastatin (PRAVACHOL) 40 MG tablet Take 40 mg by mouth daily.    [provider]  triamterene-hydrochlorothiazide (DYAZIDE) 37.5-25 MG capsule Take 1 capsule by mouth daily. 07/11/20   [provider]  triamterene-hydrochlorothiazide (MAXZIDE-25) 37.5-25 MG tablet Take 1 tablet by mouth daily.    [provider]  XIGDUO XR 06-999 MG TB24 Take 1 tablet by mouth daily. 08/05/20   [provider]    Allergies    Patient has no known allergies.  Review of Systems   Review of Systems  Constitutional: Positive for activity change.  Respiratory: Negative for shortness of breath.   Cardiovascular: Negative for chest pain.  Gastrointestinal: Negative for abdominal pain.  Genitourinary: Positive for dysuria and flank pain.  Allergic/Immunologic: Negative for immunocompromised state.  All other systems reviewed and are negative.   Physical Exam Updated Vital Signs BP (!) 151/89   Pulse 77   Temp 100 F (37.8 C) (Oral)    Resp 18   Ht 5\' 3"  (1.6 m)   Wt 95.3 kg   SpO2 96%   BMI 37.20 kg/m   Physical Exam Vitals and nursing note reviewed.  Constitutional:      Appearance: She is well-developed.  HENT:     Head: Normocephalic and atraumatic.  Eyes:     Extraocular Movements: EOM normal.  Cardiovascular:     Rate and Rhythm: Normal rate.  Pulmonary:     Effort: Pulmonary effort is normal.  Abdominal:     General: Bowel sounds are normal.  Musculoskeletal:     Cervical back: Normal range of motion and neck supple.  Skin:    General: Skin is warm and dry.  Neurological:     Mental Status: She is alert and oriented to person, place, and time.     ED Results / Procedures / Treatments   Labs (all labs ordered are listed, but only abnormal results are displayed) Labs Reviewed  URINALYSIS, ROUTINE W REFLEX MICROSCOPIC - Abnormal; Notable for the following components:      Result Value   Color, Urine BROWN (*)    APPearance CLOUDY (*)    Glucose, UA >=500 (*)    Hgb urine dipstick LARGE (*)    Protein, ur >=300 (*)    Leukocytes,Ua MODERATE (*)    RBC / HPF >50 (*)    WBC, UA >50 (*)    Bacteria, UA FEW (*)    Non Squamous Epithelial 0-5 (*)    All other components within normal limits  BASIC METABOLIC PANEL - Abnormal; Notable for the following components:   Chloride 97 (*)    Glucose, Bld 191 (*)    All other components within normal limits  CBC WITH DIFFERENTIAL/PLATELET - Abnormal; Notable for the following components:   WBC 16.8 (*)    Hemoglobin 15.6 (*)    Neutro Abs 13.8 (*)    Abs Immature Granulocytes 0.12 (*)    All other components within normal limits  URINE CULTURE    EKG None  Radiology No results found.  Procedures Procedures   Medications Ordered in ED Medications  cefTRIAXone (ROCEPHIN) 1 g in sodium chloride 0.9 % 100 mL IVPB (0 g Intravenous Stopped 10/26/20 1842)    ED Course  I have reviewed the triage vital signs and the nursing notes.  Pertinent  labs & imaging results that were available during my care of the patient were reviewed by me and considered in my medical decision making (see chart for details).    MDM Rules/Calculators/A&P                          Pt comes in with cc of Flank pain -  now resolved.  CT scan from before reviewed -she has history of chronic left hydronephrosis with likely ureteral stenosis.  Suspect ureteral colic as the cause - could be stone or the anatomical abnormality causing it. Clinically not pyelonephritis. Does appear to have cystitis. Will check UA, but she will be given antibiotics for what clinically is uti.  Final Clinical Impression(s) / ED Diagnoses Final diagnoses:  Ureteral colic  Cystitis    Rx / DC Orders ED Discharge Orders         Ordered    cephALEXin (KEFLEX) 500 MG capsule  3 times daily        10/26/20 1903           Varney Biles, MD 10/26/20 [facility] [place]

## 2020-10-26 NOTE — ED Triage Notes (Addendum)
Pt has history of Kidney stones, left flank pain woke her this morning. She is scheduled for kidney surgery for stint placement on 2/22, thinks it is the left side involved.

## 2020-10-28 LAB — URINE CULTURE

## 2020-11-03 NOTE — Patient Instructions (Signed)
Sophia Young  11/03/2020     @PREFPERIOPPHARMACY @   Your procedure is scheduled on  11/06/2020   Report to [facility] at  [place].M.   Call this number if you have problems the morning of surgery:  [phone]   Remember:  Do not eat or drink after midnight.                        Take these medicines the morning of surgery with A SIP OF WATER levothyroxine.      DO NOT take any medications for diabetes the morning of your procedure.   If your glucose id above 300 the morning of your procedure, call [phone] for instructions.  If your glucose is 70 or below the morning of your surgery, drink 1/2 cup of clear juice and recheck your glucose in 15 minutes. If your glucose is still 70 of below, call [phone] for instructions.                 Brush your teeth before you come to the hospital.  Do not wear jewelry, make-up or nail polish.  Do not wear lotions, powders, or perfumes, or deodorant.  Do not shave 48 hours prior to surgery.  Men may shave face and neck.  Do not bring valuables to the hospital.  [facility] is not responsible for any belongings or valuables.  Contacts, dentures or bridgework may not be worn into surgery.  Leave your suitcase in the car.  After surgery it may be brought to your room.  For patients admitted to the hospital, discharge time will be determined by your treatment team.  Patients discharged the day of surgery will not be allowed to drive home and must have someone with them for 24 hours.   Special instructions:   DO NOT smoke tobacco or vape the morning of your procedure.  Please read over the following fact sheets that you were given. Coughing and Deep Breathing, Surgical Site Infection Prevention, Anesthesia Post-op Instructions and Care and Recovery After Surgery       Ureteral Stent Implantation, Care After This sheet gives you information about how to care for yourself after your procedure. Your health care  provider may also give you more specific instructions. If you have problems or questions, contact your health care provider. What can I expect after the procedure? After the procedure, it is common to have:  Nausea.  Mild pain when you urinate. You may feel this pain in your lower back or lower abdomen. The pain should stop within a few minutes after you urinate. This may last for up to 1 week.  A small amount of blood in your urine for several days. Follow these instructions at home: Medicines  Take over-the-counter and prescription medicines only as told by your health care provider.  If you were prescribed an antibiotic medicine, take it as told by your health care provider. Do not stop taking the antibiotic even if you start to feel better.  Do not drive for 24 hours if you were given a sedative during your procedure.  Ask your health care provider if the medicine prescribed to you requires you to avoid driving or using heavy machinery. Activity  Rest as told by your health care provider.  Avoid sitting for a long time without moving. Get up to take short walks every 1-2 hours. This is important to improve blood flow and breathing.  Ask for help if you feel weak or unsteady.  Return to your normal activities as told by your health care provider. Ask your health care provider what activities are safe for you. General instructions  Watch for any blood in your urine. Call your health care provider if the amount of blood in your urine increases.  If you have a catheter: ? Follow instructions from your health care provider about taking care of your catheter and collection bag. ? Do not take baths, swim, or use a hot tub until your health care provider approves. Ask your health care provider if you may take showers. You may only be allowed to take sponge baths.  Drink enough fluid to keep your urine pale yellow.  Do not use any products that contain nicotine or tobacco, such as  cigarettes, e-cigarettes, and chewing tobacco. These can delay healing after surgery. If you need help quitting, ask your health care provider.  Keep all follow-up visits as told by your health care provider. This is important.   Contact a health care provider if:  You have pain that gets worse or does not get better with medicine, especially pain when you urinate.  You have difficulty urinating.  You feel nauseous or you vomit repeatedly during a period of more than 2 days after the procedure. Get help right away if:  Your urine is dark red or has blood clots in it.  You are leaking urine (have incontinence).  The end of the stent comes out of your urethra.  You cannot urinate.  You have sudden, sharp, or severe pain in your abdomen or lower back.  You have a fever.  You have swelling or pain in your legs.  You have difficulty breathing. Summary  After the procedure, it is common to have mild pain when you urinate that goes away within a few minutes after you urinate. This may last for up to 1 week.  Watch for any blood in your urine. Call your health care provider if the amount of blood in your urine increases.  Take over-the-counter and prescription medicines only as told by your health care provider.  Drink enough fluid to keep your urine pale yellow. This information is not intended to replace advice given to you by your health care provider. Make sure you discuss any questions you have with your health care provider. Document Revised: 06/06/2018 Document Reviewed: 06/07/2018 Elsevier Patient Education  2021 [employer] Anesthesia, Adult, Care After This sheet gives you information about how to care for yourself after your procedure. Your health care provider may also give you more specific instructions. If you have problems or questions, contact your health care provider. What can I expect after the procedure? After the procedure, the following side effects  are common:  Pain or discomfort at the IV site.  Nausea.  Vomiting.  Sore throat.  Trouble concentrating.  Feeling cold or chills.  Feeling weak or tired.  Sleepiness and fatigue.  Soreness and body aches. These side effects can affect parts of the body that were not involved in surgery. Follow these instructions at home: For the time period you were told by your health care provider:  Rest.  Do not participate in activities where you could fall or become injured.  Do not drive or use machinery.  Do not drink alcohol.  Do not take sleeping pills or medicines that cause drowsiness.  Do not make important decisions or sign legal documents.  Do not take care  of children on your own.   Eating and drinking  Follow any instructions from your health care provider about eating or drinking restrictions.  When you feel hungry, start by eating small amounts of foods that are soft and easy to digest (bland), such as toast. Gradually return to your regular diet.  Drink enough fluid to keep your urine pale yellow.  If you vomit, rehydrate by drinking water, juice, or clear broth. General instructions  If you have sleep apnea, surgery and certain medicines can increase your risk for breathing problems. Follow instructions from your health care provider about wearing your sleep device: ? Anytime you are sleeping, including during daytime naps. ? While taking prescription pain medicines, sleeping medicines, or medicines that make you drowsy.  Have a responsible adult stay with you for the time you are told. It is important to have someone help care for you until you are awake and alert.  Return to your normal activities as told by your health care provider. Ask your health care provider what activities are safe for you.  Take over-the-counter and prescription medicines only as told by your health care provider.  If you smoke, do not smoke without supervision.  Keep all  follow-up visits as told by your health care provider. This is important. Contact a health care provider if:  You have nausea or vomiting that does not get better with medicine.  You cannot eat or drink without vomiting.  You have pain that does not get better with medicine.  You are unable to pass urine.  You develop a skin rash.  You have a fever.  You have redness around your IV site that gets worse. Get help right away if:  You have difficulty breathing.  You have chest pain.  You have blood in your urine or stool, or you vomit blood. Summary  After the procedure, it is common to have a sore throat or nausea. It is also common to feel tired.  Have a responsible adult stay with you for the time you are told. It is important to have someone help care for you until you are awake and alert.  When you feel hungry, start by eating small amounts of foods that are soft and easy to digest (bland), such as toast. Gradually return to your regular diet.  Drink enough fluid to keep your urine pale yellow.  Return to your normal activities as told by your health care provider. Ask your health care provider what activities are safe for you. This information is not intended to replace advice given to you by your health care provider. Make sure you discuss any questions you have with your health care provider. Document Revised: 05/15/2020 Document Reviewed: 12/13/2019 Elsevier Patient Education  2021 [employer].

## 2020-11-04 ENCOUNTER — Encounter (HOSPITAL_COMMUNITY)
Admission: RE | Admit: 2020-11-04 | Discharge: 2020-11-04 | Disposition: A | Discharge status: Home / Self Care | Payer: BC Managed Care – PPO | Source: Ambulatory Visit | Attending: Urology | Admitting: Urology

## 2020-11-04 ENCOUNTER — Other Ambulatory Visit: Payer: Self-pay

## 2020-11-04 ENCOUNTER — Other Ambulatory Visit (HOSPITAL_COMMUNITY)
Admission: RE | Admit: 2020-11-04 | Discharge: 2020-11-04 | Disposition: A | Discharge status: Home / Self Care | Payer: BC Managed Care – PPO | Source: Ambulatory Visit | Attending: Urology | Admitting: Urology

## 2020-11-04 ENCOUNTER — Encounter (HOSPITAL_COMMUNITY): Payer: Self-pay

## 2020-11-04 DIAGNOSIS — N1330 Unspecified hydronephrosis: Secondary | ICD-10-CM | POA: Diagnosis not present

## 2020-11-04 DIAGNOSIS — Z20822 Contact with and (suspected) exposure to covid-19: Secondary | ICD-10-CM | POA: Insufficient documentation

## 2020-11-04 DIAGNOSIS — Z87442 Personal history of urinary calculi: Secondary | ICD-10-CM | POA: Diagnosis not present

## 2020-11-04 DIAGNOSIS — Z79899 Other long term (current) drug therapy: Secondary | ICD-10-CM | POA: Diagnosis not present

## 2020-11-04 DIAGNOSIS — Z7982 Long term (current) use of aspirin: Secondary | ICD-10-CM | POA: Diagnosis not present

## 2020-11-04 DIAGNOSIS — Z01818 Encounter for other preprocedural examination: Secondary | ICD-10-CM | POA: Insufficient documentation

## 2020-11-04 DIAGNOSIS — Z7984 Long term (current) use of oral hypoglycemic drugs: Secondary | ICD-10-CM | POA: Diagnosis not present

## 2020-11-04 DIAGNOSIS — Z79890 Hormone replacement therapy (postmenopausal): Secondary | ICD-10-CM | POA: Diagnosis not present

## 2020-11-04 DIAGNOSIS — Z87891 Personal history of nicotine dependence: Secondary | ICD-10-CM | POA: Diagnosis not present

## 2020-11-04 HISTORY — DX: Other specified postprocedural states: R11.2

## 2020-11-04 HISTORY — DX: Other complications of anesthesia, initial encounter: T88.59XA

## 2020-11-04 HISTORY — DX: Other specified postprocedural states: Z98.890

## 2020-11-04 LAB — SARS CORONAVIRUS 2 (TAT 6-24 HRS): SARS Coronavirus 2: NEGATIVE

## 2020-11-06 ENCOUNTER — Ambulatory Visit (HOSPITAL_COMMUNITY)
Admission: RE | Admit: 2020-11-06 | Discharge: 2020-11-06 | Disposition: A | Discharge status: Home / Self Care | Payer: BC Managed Care – PPO | Attending: Urology | Admitting: Urology

## 2020-11-06 ENCOUNTER — Ambulatory Visit (HOSPITAL_COMMUNITY): Payer: BC Managed Care – PPO | Admitting: Anesthesiology

## 2020-11-06 ENCOUNTER — Encounter (HOSPITAL_COMMUNITY)
Admission: RE | Disposition: A | Discharge status: Home / Self Care | Payer: Self-pay | Source: Home / Self Care | Attending: Urology

## 2020-11-06 ENCOUNTER — Encounter (HOSPITAL_COMMUNITY): Payer: Self-pay | Admitting: Urology

## 2020-11-06 ENCOUNTER — Ambulatory Visit (HOSPITAL_COMMUNITY): Payer: BC Managed Care – PPO

## 2020-11-06 DIAGNOSIS — Z20822 Contact with and (suspected) exposure to covid-19: Secondary | ICD-10-CM | POA: Insufficient documentation

## 2020-11-06 DIAGNOSIS — Z7984 Long term (current) use of oral hypoglycemic drugs: Secondary | ICD-10-CM | POA: Insufficient documentation

## 2020-11-06 DIAGNOSIS — Z8719 Personal history of other diseases of the digestive system: Secondary | ICD-10-CM

## 2020-11-06 DIAGNOSIS — Z79890 Hormone replacement therapy (postmenopausal): Secondary | ICD-10-CM | POA: Diagnosis not present

## 2020-11-06 DIAGNOSIS — Z7982 Long term (current) use of aspirin: Secondary | ICD-10-CM | POA: Insufficient documentation

## 2020-11-06 DIAGNOSIS — Z87891 Personal history of nicotine dependence: Secondary | ICD-10-CM | POA: Diagnosis not present

## 2020-11-06 DIAGNOSIS — N1330 Unspecified hydronephrosis: Secondary | ICD-10-CM | POA: Diagnosis not present

## 2020-11-06 DIAGNOSIS — Z79899 Other long term (current) drug therapy: Secondary | ICD-10-CM | POA: Diagnosis not present

## 2020-11-06 DIAGNOSIS — Z87442 Personal history of urinary calculi: Secondary | ICD-10-CM | POA: Diagnosis not present

## 2020-11-06 HISTORY — PX: CYSTOSCOPY W/ RETROGRADES: SHX1426

## 2020-11-06 HISTORY — PX: CYSTOSCOPY W/ URETERAL STENT PLACEMENT: SHX1429

## 2020-11-06 HISTORY — PX: URETEROSCOPY: SHX842

## 2020-11-06 LAB — GLUCOSE, CAPILLARY: Glucose-Capillary: 193 mg/dL — ABNORMAL HIGH (ref 70–99)

## 2020-11-06 SURGERY — CYSTOSCOPY, WITH RETROGRADE PYELOGRAM
Anesthesia: General | Site: Ureter | Laterality: Left

## 2020-11-06 MED ORDER — FENTANYL CITRATE (PF) 100 MCG/2ML IJ SOLN
INTRAMUSCULAR | Status: DC | PRN
  Administered 2020-11-06 (×2): 50 ug via INTRAVENOUS
  Administered 2020-11-06: 100 ug via INTRAVENOUS

## 2020-11-06 MED ORDER — SCOPOLAMINE 1 MG/3DAYS TD PT72
1.0000 | MEDICATED_PATCH | Freq: Once | TRANSDERMAL | Status: DC
  Administered 2020-11-06: 1.5 mg via TRANSDERMAL

## 2020-11-06 MED ORDER — MIDAZOLAM HCL 2 MG/2ML IJ SOLN
INTRAMUSCULAR | Status: AC
  Filled 2020-11-06: qty 2

## 2020-11-06 MED ORDER — OXYCODONE-ACETAMINOPHEN 5-325 MG PO TABS: 1.0000 | ORAL_TABLET | ORAL | 0 refills | Status: DC | PRN

## 2020-11-06 MED ORDER — ONDANSETRON HCL 4 MG/2ML IJ SOLN: 4.0000 mg | Freq: Once | INTRAMUSCULAR | Status: DC | PRN

## 2020-11-06 MED ORDER — ONDANSETRON HCL 4 MG/2ML IJ SOLN
INTRAMUSCULAR | Status: DC | PRN
  Administered 2020-11-06: 4 mg via INTRAVENOUS

## 2020-11-06 MED ORDER — ORAL CARE MOUTH RINSE: 15.0000 mL | Freq: Once | OROMUCOSAL | Status: AC

## 2020-11-06 MED ORDER — FENTANYL CITRATE (PF) 100 MCG/2ML IJ SOLN: 25.0000 ug | INTRAMUSCULAR | Status: DC | PRN

## 2020-11-06 MED ORDER — CEFAZOLIN SODIUM-DEXTROSE 2-4 GM/100ML-% IV SOLN
INTRAVENOUS | Status: AC
  Filled 2020-11-06: qty 100

## 2020-11-06 MED ORDER — EPHEDRINE SULFATE 50 MG/ML IJ SOLN
INTRAMUSCULAR | Status: DC | PRN
  Administered 2020-11-06 (×2): 10 mg via INTRAVENOUS

## 2020-11-06 MED ORDER — FENTANYL CITRATE (PF) 100 MCG/2ML IJ SOLN
INTRAMUSCULAR | Status: AC
  Filled 2020-11-06: qty 2

## 2020-11-06 MED ORDER — CEFAZOLIN SODIUM-DEXTROSE 2-4 GM/100ML-% IV SOLN
2.0000 g | INTRAVENOUS | Status: AC
  Administered 2020-11-06: 2 g via INTRAVENOUS
  Filled 2020-11-06: qty 100

## 2020-11-06 MED ORDER — STERILE WATER FOR IRRIGATION IR SOLN
Status: DC | PRN
  Administered 2020-11-06: 500 mL

## 2020-11-06 MED ORDER — DIATRIZOATE MEGLUMINE 30 % UR SOLN
URETHRAL | Status: AC
  Filled 2020-11-06: qty 100

## 2020-11-06 MED ORDER — DIATRIZOATE MEGLUMINE 30 % UR SOLN
URETHRAL | Status: DC | PRN
  Administered 2020-11-06: 20 mL via URETHRAL

## 2020-11-06 MED ORDER — SODIUM CHLORIDE 0.9 % IR SOLN
Status: DC | PRN
  Administered 2020-11-06 (×2): 3000 mL

## 2020-11-06 MED ORDER — PROPOFOL 10 MG/ML IV BOLUS
INTRAVENOUS | Status: DC | PRN
  Administered 2020-11-06: 160 mg via INTRAVENOUS

## 2020-11-06 MED ORDER — ONDANSETRON HCL 4 MG/2ML IJ SOLN
INTRAMUSCULAR | Status: AC
  Filled 2020-11-06: qty 2

## 2020-11-06 MED ORDER — ONDANSETRON HCL 4 MG PO TABS: 4.0000 mg | ORAL_TABLET | Freq: Every day | ORAL | 1 refills | Status: DC | PRN

## 2020-11-06 MED ORDER — PROPOFOL 10 MG/ML IV BOLUS
INTRAVENOUS | Status: AC
  Filled 2020-11-06: qty 40

## 2020-11-06 MED ORDER — MIDAZOLAM HCL 5 MG/5ML IJ SOLN
INTRAMUSCULAR | Status: DC | PRN
  Administered 2020-11-06: 2 mg via INTRAVENOUS

## 2020-11-06 MED ORDER — LACTATED RINGERS IV SOLN: INTRAVENOUS | Status: DC

## 2020-11-06 MED ORDER — TAMSULOSIN HCL 0.4 MG PO CAPS: 0.4000 mg | ORAL_CAPSULE | Freq: Every day | ORAL | 0 refills | Status: DC

## 2020-11-06 MED ORDER — SCOPOLAMINE 1 MG/3DAYS TD PT72
MEDICATED_PATCH | TRANSDERMAL | Status: AC
  Filled 2020-11-06: qty 1

## 2020-11-06 MED ORDER — EPHEDRINE 5 MG/ML INJ
INTRAVENOUS | Status: AC
  Filled 2020-11-06: qty 10

## 2020-11-06 MED ORDER — CHLORHEXIDINE GLUCONATE 0.12 % MT SOLN
15.0000 mL | Freq: Once | OROMUCOSAL | Status: AC
  Administered 2020-11-06: 15 mL via OROMUCOSAL
  Filled 2020-11-06: qty 15

## 2020-11-06 SURGICAL SUPPLY — 21 items
BAG DRAIN URO TABLE W/ADPT NS (BAG) ×4 IMPLANT
BAG DRN 8 ADPR NS SKTRN CSTL (BAG) ×3
BAG HAMPER (MISCELLANEOUS) ×4 IMPLANT
CATH INTERMIT  6FR 70CM (CATHETERS) ×4 IMPLANT
CLOTH BEACON ORANGE TIMEOUT ST (SAFETY) ×4 IMPLANT
DECANTER SPIKE VIAL GLASS SM (MISCELLANEOUS) ×4 IMPLANT
GLOVE BIO SURGEON STRL SZ8 (GLOVE) ×4 IMPLANT
GLOVE SURG UNDER POLY LF SZ7 (GLOVE) ×8 IMPLANT
GOWN STRL REUS W/TWL LRG LVL3 (GOWN DISPOSABLE) ×4 IMPLANT
GOWN STRL REUS W/TWL XL LVL3 (GOWN DISPOSABLE) ×4 IMPLANT
GUIDEWIRE STR DUAL SENSOR (WIRE) IMPLANT
GUIDEWIRE STR ZIPWIRE 035X150 (MISCELLANEOUS) ×4 IMPLANT
IV NS IRRIG 3000ML ARTHROMATIC (IV SOLUTION) ×8 IMPLANT
KIT TURNOVER CYSTO (KITS) ×4 IMPLANT
MANIFOLD NEPTUNE II (INSTRUMENTS) ×4 IMPLANT
PACK CYSTO (CUSTOM PROCEDURE TRAY) ×4 IMPLANT
PAD ARMBOARD 7.5X6 YLW CONV (MISCELLANEOUS) ×4 IMPLANT
STENT URET 6FRX24 CONTOUR (STENTS) ×4 IMPLANT
SYR 10ML LL (SYRINGE) ×4 IMPLANT
TOWEL OR 17X26 4PK STRL BLUE (TOWEL DISPOSABLE) ×4 IMPLANT
WATER STERILE IRR 500ML POUR (IV SOLUTION) ×4 IMPLANT

## 2020-11-06 NOTE — Anesthesia Procedure Notes (Signed)
Procedure Name: LMA Insertion Date/Time: 11/06/2020 7:51 AM Performed by: Jonna Munro, CRNA Pre-anesthesia Checklist: Patient identified, Emergency Drugs available, Suction available, Patient being monitored and Timeout performed Patient Re-evaluated:Patient Re-evaluated prior to induction Oxygen Delivery Method: Circle system utilized Preoxygenation: Pre-oxygenation with 100% oxygen Induction Type: IV induction LMA: LMA inserted LMA Size: 4.0 Number of attempts: 1 Placement Confirmation: positive ETCO2 and breath sounds checked- equal and bilateral Dental Injury: Teeth and Oropharynx as per pre-operative assessment

## 2020-11-06 NOTE — Discharge Instructions (Signed)
Ureteroscopy Ureteroscopy is a procedure to check for and treat problems inside part of the urinary tract. In this procedure, a thin, flexible tube with a light at the end (ureteroscope) is used to look at the inside of the kidneys and the ureters. The ureters are the tubes that carry urine from the kidneys to the bladder. The ureteroscope is inserted into one or both of the ureters. You may need this procedure if you have frequent urinary tract infections (UTIs), blood in your urine, or a stone in one of your ureters. A ureteroscopy can be done:  To find the cause of urine blockage in a ureter and to evaluate other abnormalities inside the ureters or kidneys.  To remove stones.  To remove or treat growths of tissue (polyps), abnormal tissue, and some types of tumors.  To remove a tissue sample and check it for disease under a microscope (biopsy). Tell a health care provider about:  Any allergies you have.  All medicines you are taking, including vitamins, herbs, eye drops, creams, and over-the-counter medicines.  Any problems you or family members have had with anesthetic medicines.  Any blood disorders you have.  Any surgeries you have had.  Any medical conditions you have.  Whether you are pregnant or may be pregnant. What are the risks? Generally, this is a safe procedure. However, problems may occur, including:  Bleeding.  Infection.  Allergic reactions to medicines.  Scarring that narrows the ureter (stricture).  Creating a hole in the ureter (perforation). What happens before the procedure? Staying hydrated Follow instructions from your health care provider about hydration, which may include:  Up to 2 hours before the procedure - you may continue to drink clear liquids, such as water, clear fruit juice, black coffee, and plain tea.   Eating and drinking restrictions Follow instructions from your health care provider about eating and drinking, which may include:  8  hours before the procedure - stop eating heavy meals or foods, such as meat, fried foods, or fatty foods.  6 hours before the procedure - stop eating light meals or foods, such as toast or cereal.  6 hours before the procedure - stop drinking milk or drinks that contain milk.  2 hours before the procedure - stop drinking clear liquids. Medicines Ask your health care provider about:  Changing or stopping your regular medicines. This is especially important if you are taking diabetes medicines or blood thinners.  Taking medicines such as aspirin and ibuprofen. These medicines can thin your blood. Do not take these medicines unless your health care provider tells you to take them.  Taking over-the-counter medicines, vitamins, herbs, and supplements. General instructions  Do not use any products that contain nicotine or tobacco for at least 4 weeks before the procedure. These products include cigarettes, e-cigarettes, and chewing tobacco. If you need help quitting, ask your health care provider.  You may have a urine sample taken to check for infection.  Plan to have someone take you home from the hospital or clinic.  If you will be going home right after the procedure, plan to have someone with you for 24 hours.  Ask your health care provider what steps will be taken to help prevent infection. These may include: ? Washing skin with a germ-killing soap. ? Receiving antibiotic medicine. What happens during the procedure?  An IV will be inserted into one of your veins.  You will be given one or more of the following: ? A medicine to help  you relax (sedative). ? A medicine to make you fall asleep (general anesthetic). ? A medicine that is injected into your spine to numb the area below and slightly above the injection site (spinal anesthetic).  The part of your body that drains urine from your bladder (urethra) will be cleaned with a germ-killing solution.  The ureteroscope will be  passed through your urethra into your bladder.  A salt-water solution will be sent through the ureteroscope to fill your bladder. This will help the health care provider see the openings of your ureters more clearly.  The ureteroscope will be passed into your ureter. ? If a growth is found, a biopsy may be done. ? If a stone is found, it may be removed through the ureteroscope, or the stone may be broken up using a laser, shock waves, or electrical energy. ? In some cases, if the ureter is too small, a tube may be inserted that keeps the ureter open (ureteral stent). The stent may be left in place for 1 or 2 weeks to keep the ureter open, and then the ureteroscopy procedure will be done.  The scope will be removed, and your bladder will be emptied. The procedure may vary among health care providers and hospitals.   What can I expect after the procedure? After your procedure, it is common to have:  Your blood pressure, heart rate, breathing rate, and blood oxygen level monitored until you leave the hospital or clinic.  A burning sensation when you urinate. You may be asked to urinate.  Blood in your urine.  Mild discomfort in your bladder area or kidney area when urinating.  A need to urinate more often or urgently. Follow these instructions at home: Medicines  Take over-the-counter and prescription medicines only as told by your health care provider.  If you were prescribed an antibiotic medicine, take it as told by your health care provider. Do not stop taking the antibiotic even if you start to feel better. General instructions  If you were given a sedative during the procedure, it can affect you for several hours. Do not drive or operate machinery until your health care provider says that it is safe.  To relieve burning, take a warm bath or hold a warm washcloth over your groin.  Drink enough fluid to keep your urine pale yellow. ? Drink two 8-ounce (237 mL) glasses of water  every hour for the first 2 hours after you get home. ? Continue to drink water often at home.  You can eat what you normally do.  Keep all follow-up visits as told by your health care provider. This is important. ? If you had a ureteral stent placed, ask your health care provider when you need to return to have it removed.   Contact a health care provider if you have:  Chills or a fever.  Burning pain for longer than 24 hours after the procedure.  Blood in your urine for longer than 24 hours after the procedure. Get help right away if you have:  Large amounts of blood in your urine.  Blood clots in your urine.  Severe pain.  Chest pain or trouble breathing.  The feeling of a full bladder and you are unable to urinate. These symptoms may represent a serious problem that is an emergency. Do not wait to see if the symptoms will go away. Get medical help right away. Call your local emergency services (911 in the [place] Summary  Ureteroscopy is a procedure to  check for and treat problems inside part of the urinary tract.  In this procedure, a thin, flexible tube with a light at the end (ureteroscope) is used to look at the inside of the kidneys and the ureters.  You may need this procedure if you have frequent urinary tract infections (UTIs), blood in your urine, or a stone in a ureter. This information is not intended to replace advice given to you by your health care provider. Make sure you discuss any questions you have with your health care provider. Document Revised: 06/06/2019 Document Reviewed: 06/06/2019 Elsevier Patient Education  2021 [place] [facility], Adult, Care After This sheet gives you information about how to care for yourself after your procedure. Your health care provider may also give you more specific instructions. If you have problems or questions, contact your health care provider. What can I expect after the procedure? After the  procedure, the following side effects are common:  Pain or discomfort at the IV site.  Nausea.  Vomiting.  Sore throat.  Trouble concentrating.  Feeling cold or chills.  Feeling weak or tired.  Sleepiness and fatigue.  Soreness and body aches. These side effects can affect parts of the body that were not involved in surgery. Follow these instructions at home: For the time period you were told by your health care provider:  Rest.  Do not participate in activities where you could fall or become injured.  Do not drive or use machinery.  Do not drink alcohol.  Do not take sleeping pills or medicines that cause drowsiness.  Do not make important decisions or sign legal documents.  Do not take care of children on your own.   Eating and drinking  Follow any instructions from your health care provider about eating or drinking restrictions.  When you feel hungry, start by eating small amounts of foods that are soft and easy to digest (bland), such as toast. Gradually return to your regular diet.  Drink enough fluid to keep your urine pale yellow.  If you vomit, rehydrate by drinking water, juice, or clear broth. General instructions  If you have sleep apnea, surgery and certain medicines can increase your risk for breathing problems. Follow instructions from your health care provider about wearing your sleep device: ? Anytime you are sleeping, including during daytime naps. ? While taking prescription pain medicines, sleeping medicines, or medicines that make you drowsy.  Have a responsible adult stay with you for the time you are told. It is important to have someone help care for you until you are awake and alert.  Return to your normal activities as told by your health care provider. Ask your health care provider what activities are safe for you.  Take over-the-counter and prescription medicines only as told by your health care provider.  If you smoke, do not smoke  without supervision.  Keep all follow-up visits as told by your health care provider. This is important. Contact a health care provider if:  You have nausea or vomiting that does not get better with medicine.  You cannot eat or drink without vomiting.  You have pain that does not get better with medicine.  You are unable to pass urine.  You develop a skin rash.  You have a fever.  You have redness around your IV site that gets worse. Get help right away if:  You have difficulty breathing.  You have chest pain.  You have blood in your urine  or stool, or you vomit blood. Summary  After the procedure, it is common to have a sore throat or nausea. It is also common to feel tired.  Have a responsible adult stay with you for the time you are told. It is important to have someone help care for you until you are awake and alert.  When you feel hungry, start by eating small amounts of foods that are soft and easy to digest (bland), such as toast. Gradually return to your regular diet.  Drink enough fluid to keep your urine pale yellow.  Return to your normal activities as told by your health care provider. Ask your health care provider what activities are safe for you. This information is not intended to replace advice given to you by your health care provider. Make sure you discuss any questions you have with your health care provider. Document Revised: 05/15/2020 Document Reviewed: 12/13/2019 Elsevier Patient Education  2021 [place].     Acetaminophen; Oxycodone tablets What is this medicine? ACETAMINOPHEN; OXYCODONE (a set a MEE noe fen; ox i KOE done) is a pain reliever. It is used to treat moderate to severe pain. This medicine may be used for other purposes; ask your health care provider or pharmacist if you have questions. COMMON BRAND NAME(S): Endocet, Magnacet, Nalocet, Narvox, Percocet, Perloxx, Primalev, Primlev, Prolate, Roxicet, Xolox What should I tell my  health care provider before I take this medicine? They need to know if you have any of these conditions:  brain tumor  drug abuse or addiction  head injury  heart disease  if you often drink alcohol   kidney disease   liver disease  low adrenal gland function  lung disease, asthma, or breathing problem  seizures  stomach or intestine problems  taken an MAOI like Marplan, Nardil, or Parnate in the last 14 days  an unusual or allergic reaction to acetaminophen, oxycodone, other medicines, foods, dyes, or preservative  pregnant or trying to get pregnant  breast-feeding How should I use this medicine? Take this medicine by mouth with a full glass of water. Take it as directed on the label. You can take it with or without food. If it upsets your stomach, take it with food. Do not use it more often than directed. There may be unused or extra doses in the bottle after you finish your treatment. Talk to your health care provider if you have questions about your dose. A special MedGuide will be given to you by the pharmacist with each prescription and refill. Be sure to read this information carefully each time. Talk to your health care provider about the use of this medicine in children. Special care may be needed. Patients over 63 years of age may have a stronger reaction and need a smaller dose. Overdosage: If you think you have taken too much of this medicine contact a poison control center or emergency room at once. NOTE: This medicine is only for you. Do not share this medicine with others. What if I miss a dose? This does not apply. This medicine is not for regular use. It should only be used as needed. What may interact with this medicine? This medicine may interact with the following medications:  alcohol  antihistamines for allergy, cough and cold  antiviral medicines for HIV or AIDS  atropine  certain antibiotics like clarithromycin, erythromycin, linezolid,  rifampin  certain medicines for anxiety or sleep  certain medicines for bladder problems like oxybutynin, tolterodine  certain medicines for  depression like amitriptyline, fluoxetine, sertraline  certain medicines for fungal infections like ketoconazole, itraconazole, voriconazole  certain medicines for migraine headache like almotriptan, eletriptan, frovatriptan, naratriptan, rizatriptan, sumatriptan, zolmitriptan  certain medicines for nausea or vomiting like dolasetron, ondansetron, palonosetron  certain medicines for Parkinson's disease like benztropine, trihexyphenidyl  certain medicines for seizures like phenobarbital, phenytoin, primidone  certain medicines for stomach problems like dicyclomine, hyoscyamine  certain medicines for travel sickness like scopolamine  diuretics  general anesthetics like halothane, isoflurane, methoxyflurane, propofol  ipratropium  local anesthetics like lidocaine, pramoxine, tetracaine  MAOIs like Carbex, Eldepryl, Marplan, Nardil, and Parnate  medicines that relax muscles for surgery  methylene blue  nilotinib  other medicines with acetaminophen  other narcotic medicines for pain or cough  phenothiazines like chlorpromazine, mesoridazine, prochlorperazine, thioridazine This list may not describe all possible interactions. Give your health care provider a list of all the medicines, herbs, non-prescription drugs, or dietary supplements you use. Also tell them if you smoke, drink alcohol, or use illegal drugs. Some items may interact with your medicine. What should I watch for while using this medicine? Tell your health care provider if your pain does not go away, if it gets worse, or if you have new or a different type of pain. You may develop tolerance to this drug. Tolerance means that you will need a higher dose of the drug for pain relief. Tolerance is normal and is expected if you take this drug for a long time. There are different  types of narcotic drugs (opioids) for pain. If you take more than one type at the same time, you may have more side effects. Give your health care provider a list of all drugs you use. He or she will tell you how much drug to take. Do not take more drug than directed. Get emergency help right away if you have problems breathing. Do not suddenly stop taking your drug because you may develop a severe reaction. Your body becomes used to the drug. This does NOT mean you are addicted. Addiction is a behavior related to getting and using a drug for a nonmedical reason. If you have pain, you have a medical reason to take pain drug. Your health care provider will tell you how much drug to take. If your health care provider wants you to stop the drug, the dose will be slowly lowered over time to avoid any side effects. Talk to your health care provider about naloxone and how to get it. Naloxone is an emergency drug used for an opioid overdose. An overdose can happen if you take too much opioid. It can also happen if an opioid is taken with some other drugs or substances, like alcohol. Know the symptoms of an overdose, like trouble breathing, unusually tired or sleepy, or not being able to respond or wake up. Make sure to tell caregivers and close contacts where it is stored. Make sure they know how to use it. After naloxone is given, you must get emergency help right away. Naloxone is a temporary treatment. Repeat doses may be needed. Do not take other drugs that contain acetaminophen with this drug. Many non-prescription drugs contain acetaminophen. Always read labels carefully. If you have questions, ask your health care provider. If you take too much acetaminophen, get medical help right away. Too much acetaminophen can be very dangerous and cause liver damage. Even if you do not have symptoms, it is important to get help right away. This drug does not prevent a heart  attack or stroke. This drug may increase the  chance of a heart attack or stroke. The chance may increase the longer you use this drug or if you have heart disease. If you take aspirin to prevent a heart attack or stroke, talk to your health care provider about using this drug. You may get drowsy or dizzy. Do not drive, use machinery, or do anything that needs mental alertness until you know how this drug affects you. Do not stand up or sit up quickly, especially if you are an older patient. This reduces the risk of dizzy or fainting spells. Alcohol may interfere with the effect of this drug. Avoid alcoholic drinks. This drug will cause constipation. If you do not have a bowel movement for 3 days, call your health care provider. Your mouth may get dry. Chewing sugarless gum or sucking hard candy and drinking plenty of water may help. Contact your health care provider if the problem does not go away or is severe. What side effects may I notice from receiving this medicine? Side effects that you should report to your doctor or health care professional as soon as possible:  allergic reactions (skin rash, itching or hives; swelling of the face, lips, or tongue)  confusion  kidney injury (trouble passing urine or change in the amount of urine)  light-colored stool  liver injury (dark yellow or brown urine; general ill feeling or flu-like symptoms; loss of appetite, right upper belly pain; unusually weak or tired, yellowing of the eyes or skin)  low adrenal gland function (nausea; vomiting; loss of appetite; unusually weak or tired; dizziness; low blood pressure)  low blood pressure (dizziness; feeling faint or lightheaded, falls; unusually weak or tired)  redness, blistering, peeling, or loosening of the skin, including inside the mouth  serotonin syndrome (irritable; confusion; diarrhea; fast or irregular heartbeat; muscle twitching; stiff muscles; trouble walking; sweating; high fever; seizures; chills; vomiting)  trouble breathing Side  effects that usually do not require medical attention (report to your doctor or health care professional if they continue or are bothersome):  constipation  dry mouth  nausea, vomiting  tiredness This list may not describe all possible side effects. Call your doctor for medical advice about side effects. You may report side effects to FDA at 1-800-FDA-1088. Where should I keep my medicine? Keep out of the reach of children and pets. This medicine can be abused. Keep it in a safe place to protect it from theft. Do not share it with anyone. It is only for you. Selling or giving away this medicine is dangerous and against the law. Store at room temperature between 20 and 25 degrees C (68 and 77 degrees F). Protect from light. Get rid of any unused medicine after the expiration date. This medicine may cause harm and death if it is taken by other adults, children, or pets. It is important to get rid of the medicine as soon as you no longer need it or it is expired. You can do this in two ways:  Take the medicine to a medicine take-back program. Check with your pharmacy or law enforcement to find a location.  If you cannot return the medicine, flush it down the toilet. NOTE: This sheet is a summary. It may not cover all possible information. If you have questions about this medicine, talk to your doctor, pharmacist, or health care provider.  2021 Elsevier/Gold Standard (2020-05-28 11:12:15)      Tamsulosin capsules What is this medicine? TAMSULOSIN (tam SOO  loe sin) is an alpha blocker. It is used to treat the signs and symptoms of an enlarged prostate in men. This condition is also called benign prostatic hyperplasia (BPH). This medicine may be used for other purposes; ask your health care provider or pharmacist if you have questions. COMMON BRAND NAME(S): Flomax What should I tell my health care provider before I take this medicine? They need to know if you have any of the following  conditions:  advanced kidney disease  advanced liver disease  low blood pressure  prostate cancer  an unusual or allergic reaction to tamsulosin, sulfa drugs, other medicines, foods, dyes, or preservatives  pregnant or trying to get pregnant  breast-feeding How should I use this medicine? Take this medicine by mouth about 30 minutes after the same meal every day. Follow the directions on the prescription label. Swallow the capsules whole with a glass of water. Do not crush, chew, or open capsules. Do not take your medicine more often than directed. Do not stop taking your medicine unless your doctor tells you to. Talk to your pediatrician regarding the use of this medicine in children. Special care may be needed. Overdosage: If you think you have taken too much of this medicine contact a poison control center or emergency room at once. NOTE: This medicine is only for you. Do not share this medicine with others. What if I miss a dose? If you miss a dose, take it as soon as you can. If it is almost time for your next dose, take only that dose. Do not take double or extra doses. If you stop taking your medicine for several days or more, ask your doctor or health care professional what dose you should start back on. What may interact with this medicine?  cimetidine  fluoxetine  ketoconazole  medicines for erectile disfunction like sildenafil, tadalafil, vardenafil  medicines for high blood pressure  other alpha-blockers like alfuzosin, doxazosin, phentolamine, phenoxybenzamine, prazosin, terazosin  warfarin This list may not describe all possible interactions. Give your health care provider a list of all the medicines, herbs, non-prescription drugs, or dietary supplements you use. Also tell them if you smoke, drink alcohol, or use illegal drugs. Some items may interact with your medicine. What should I watch for while using this medicine? Visit your doctor or health care  professional for regular check ups. You will need lab work done before you start this medicine and regularly while you are taking it. Check your blood pressure as directed. Ask your health care professional what your blood pressure should be, and when you should contact him or her. This medicine may make you feel dizzy or lightheaded. This is more likely to happen after the first dose, after an increase in dose, or during hot weather or exercise. Drinking alcohol and taking some medicines can make this worse. Do not drive, use machinery, or do anything that needs mental alertness until you know how this medicine affects you. Do not sit or stand up quickly. If you begin to feel dizzy, sit down until you feel better. These effects can decrease once your body adjusts to the medicine. Contact your doctor or health care professional right away if you have an erection that lasts longer than 4 hours or if it becomes painful. This may be a sign of a serious problem and must be treated right away to prevent permanent damage. If you are thinking of having cataract surgery, tell your eye surgeon that you have taken this medicine.  What side effects may I notice from receiving this medicine? Side effects that you should report to your doctor or health care professional as soon as possible:  allergic reactions like skin rash or itching, hives, swelling of the lips, mouth, tongue, or throat  breathing problems  change in vision  feeling faint or lightheaded  irregular heartbeat  prolonged or painful erection  weakness Side effects that usually do not require medical attention (report to your doctor or health care professional if they continue or are bothersome):  back pain  change in sex drive or performance  constipation, nausea or vomiting  cough  drowsy  runny or stuffy nose  trouble sleeping This list may not describe all possible side effects. Call your doctor for medical advice about side  effects. You may report side effects to FDA at 1-800-FDA-1088. Where should I keep my medicine? Keep out of the reach of children. Store at room temperature between 15 and 30 degrees C (59 and 86 degrees F). Throw away any unused medicine after the expiration date. NOTE: This sheet is a summary. It may not cover all possible information. If you have questions about this medicine, talk to your doctor, pharmacist, or health care provider.  2021 Elsevier/Gold Standard (2018-02-02 12:54:06)    PLEASE REMOVE THE SCOPOLAMINE [facility] ON Sunday February 27,2022. [place] HANDS AFTER REMOVAL  Scopolamine skin patches What is this medicine? SCOPOLAMINE (skoe POL a meen) is used to prevent nausea and vomiting caused by motion sickness, anesthesia and surgery. This medicine may be used for other purposes; ask your health care provider or pharmacist if you have questions. COMMON BRAND NAME(S): Transderm Scop What should I tell my health care provider before I take this medicine? They need to know if you have any of these conditions:  are scheduled to have a gastric secretion test  glaucoma  heart disease  kidney disease  liver disease  lung or breathing disease, like asthma  mental illness  prostate disease  seizures  stomach or intestine problems  trouble passing urine  an unusual or allergic reaction to scopolamine, atropine, other medicines, foods, dyes, or preservatives  pregnant or trying to get pregnant  breast-feeding How should I use this medicine? This medicine is for external use only. Follow the directions on the prescription label. Wear only 1 patch at a time. Choose an area behind the ear, that is clean, dry, hairless and free from any cuts or irritation. Wipe the area with a clean dry tissue. Peel off the plastic backing of the skin patch, trying not to touch the adhesive side with your hands. Do not cut the patches. Firmly apply to the area you have chosen, with the  metallic side of the patch to the skin and the tan-colored side showing. Once firmly in place, wash your hands well with soap and water. Do not get this medicine into your eyes. After removing the patch, wash your hands and the area behind your ear thoroughly with soap and water. The patch will still contain some medicine after use. To avoid accidental contact or ingestion by children or pets, fold the used patch in half with the sticky side together and throw away in the trash out of the reach of children and pets. If you need to use a second patch after you remove the first, place it behind the other ear. A special MedGuide will be given to you by the pharmacist with each prescription and refill. Be sure to read this information carefully  each time. Talk to your pediatrician regarding the use of this medicine in children. Special care may be needed. Overdosage: If you think you have taken too much of this medicine contact a poison control center or emergency room at once. NOTE: This medicine is only for you. Do not share this medicine with others. What if I miss a dose? This does not apply. This medicine is not for regular use. What may interact with this medicine?  alcohol  antihistamines for allergy cough and cold  atropine  certain medicines for anxiety or sleep  certain medicines for bladder problems like oxybutynin, tolterodine  certain medicines for depression like amitriptyline, fluoxetine, sertraline  certain medicines for stomach problems like dicyclomine, hyoscyamine  certain medicines for Parkinson's disease like benztropine, trihexyphenidyl  certain medicines for seizures like phenobarbital, primidone  general anesthetics like halothane, isoflurane, methoxyflurane, propofol  ipratropium  local anesthetics like lidocaine, pramoxine, tetracaine  medicines that relax muscles for surgery  phenothiazines like chlorpromazine, mesoridazine, prochlorperazine,  thioridazine  narcotic medicines for pain  other belladonna alkaloids This list may not describe all possible interactions. Give your health care provider a list of all the medicines, herbs, non-prescription drugs, or dietary supplements you use. Also tell them if you smoke, drink alcohol, or use illegal drugs. Some items may interact with your medicine. What should I watch for while using this medicine? Limit contact with water while swimming and bathing because the patch may fall off. If the patch falls off, throw it away and put a new one behind the other ear. You may get drowsy or dizzy. Do not drive, use machinery, or do anything that needs mental alertness until you know how this medicine affects you. Do not stand or sit up quickly, especially if you are an older patient. This reduces the risk of dizzy or fainting spells. Alcohol may interfere with the effect of this medicine. Avoid alcoholic drinks. Your mouth may get dry. Chewing sugarless gum or sucking hard candy, and drinking plenty of water may help. Contact your healthcare professional if the problem does not go away or is severe. This medicine may cause dry eyes and blurred vision. If you wear contact lenses, you may feel some discomfort. Lubricating drops may help. See your healthcare professional if the problem does not go away or is severe. If you are going to need surgery, an MRI, CT scan, or other procedure, tell your healthcare professional that you are using this medicine. You may need to remove the patch before the procedure. What side effects may I notice from receiving this medicine? Side effects that you should report to your doctor or health care professional as soon as possible:  allergic reactions like skin rash, itching or hives; swelling of the face, lips, or tongue  blurred vision  changes in vision  confusion  dizziness  eye pain  fast, irregular heartbeat  hallucinations, loss of contact with  reality  nausea, vomiting  pain or trouble passing urine  restlessness  seizures  skin irritation  stomach pain Side effects that usually do not require medical attention (report to your doctor or health care professional if they continue or are bothersome):  drowsiness  dry mouth  headache  sore throat This list may not describe all possible side effects. Call your doctor for medical advice about side effects. You may report side effects to FDA at 1-800-FDA-1088. Where should I keep my medicine? Keep out of the reach of children. Store at room temperature between 20 and  25 degrees C (68 and 77 degrees F). Keep this medicine in the foil package until ready to use. Throw away any unused medicine after the expiration date. NOTE: This sheet is a summary. It may not cover all possible information. If you have questions about this medicine, talk to your doctor, pharmacist, or health care provider.  2021 Elsevier/Gold Standard (2017-11-18 16:14:46)    Ondansetron oral dissolving tablet What is this medicine? ONDANSETRON (on DAN se tron) is used to treat nausea and vomiting caused by chemotherapy. It is also used to prevent or treat nausea and vomiting after surgery. This medicine may be used for other purposes; ask your health care provider or pharmacist if you have questions. COMMON BRAND NAME(S): Zofran ODT What should I tell my health care provider before I take this medicine? They need to know if you have any of these conditions:  heart disease  history of irregular heartbeat  liver disease  low levels of magnesium or potassium in the blood  an unusual or allergic reaction to ondansetron, granisetron, other medicines, foods, dyes, or preservatives  pregnant or trying to get pregnant  breast-feeding How should I use this medicine? These tablets are made to dissolve in the mouth. Do not try to push the tablet through the foil backing. With dry hands, peel away the  foil backing and gently remove the tablet. Place the tablet in the mouth and allow it to dissolve, then swallow. While you may take these tablets with water, it is not necessary to do so. Talk to your pediatrician regarding the use of this medicine in children. Special care may be needed. Overdosage: If you think you have taken too much of this medicine contact a poison control center or emergency room at once. NOTE: This medicine is only for you. Do not share this medicine with others. What if I miss a dose? If you miss a dose, take it as soon as you can. If it is almost time for your next dose, take only that dose. Do not take double or extra doses. What may interact with this medicine? Do not take this medicine with any of the following medications:  apomorphine  certain medicines for fungal infections like fluconazole, itraconazole, ketoconazole, posaconazole, voriconazole  cisapride  dronedarone  pimozide  thioridazine This medicine may also interact with the following medications:  carbamazepine  certain medicines for depression, anxiety, or psychotic disturbances  fentanyl  linezolid  MAOIs like Carbex, Eldepryl, Marplan, Nardil, and Parnate  methylene blue (injected into a vein)  other medicines that prolong the QT interval (cause an abnormal heart rhythm) like dofetilide, ziprasidone  phenytoin  rifampicin  tramadol This list may not describe all possible interactions. Give your health care provider a list of all the medicines, herbs, non-prescription drugs, or dietary supplements you use. Also tell them if you smoke, drink alcohol, or use illegal drugs. Some items may interact with your medicine. What should I watch for while using this medicine? Check with your doctor or health care professional as soon as you can if you have any sign of an allergic reaction. What side effects may I notice from receiving this medicine? Side effects that you should report to  your doctor or health care professional as soon as possible:  allergic reactions like skin rash, itching or hives, swelling of the face, lips, or tongue  breathing problems  confusion  dizziness  fast or irregular heartbeat  feeling faint or lightheaded, falls  fever and chills  loss  of balance or coordination  seizures  sweating  swelling of the hands and feet  tightness in the chest  tremors  unusually weak or tired Side effects that usually do not require medical attention (report to your doctor or health care professional if they continue or are bothersome):  constipation or diarrhea  headache This list may not describe all possible side effects. Call your doctor for medical advice about side effects. You may report side effects to FDA at 1-800-FDA-1088. Where should I keep my medicine? Keep out of the reach of children. Store between 2 and 30 degrees C (36 and 86 degrees F). Throw away any unused medicine after the expiration date. NOTE: This sheet is a summary. It may not cover all possible information. If you have questions about this medicine, talk to your doctor, pharmacist, or health care provider.  2021 Elsevier/Gold Standard (2018-08-22 07:14:10)

## 2020-11-06 NOTE — Anesthesia Postprocedure Evaluation (Signed)
Anesthesia Post Note  Patient: Nekeisha Aure Ridgely  Procedure(s) Performed: CYSTOSCOPY WITH RETROGRADE PYELOGRAM (Bilateral ) URETEROSCOPY- diagnostic (Left Ureter) CYSTOSCOPY WITH STENT REPLACEMENT (Left Ureter)  Patient location during evaluation: PACU Anesthesia Type: General Level of consciousness: awake, oriented, awake and alert and patient cooperative Pain management: satisfactory to patient Vital Signs Assessment: post-procedure vital signs reviewed and stable Respiratory status: spontaneous breathing, respiratory function stable, nonlabored ventilation and patient connected to nasal cannula oxygen Cardiovascular status: stable Postop Assessment: no apparent nausea or vomiting Anesthetic complications: no   No complications documented.   Last Vitals:  Vitals:   11/06/20 0657  Pulse: 62  Resp: 18  Temp: 36.9 C  SpO2: 93%    Last Pain:  Vitals:   11/06/20 0657  TempSrc: Oral                 Jalien Weakland

## 2020-11-06 NOTE — Anesthesia Preprocedure Evaluation (Signed)
Anesthesia Evaluation  Patient identified by MRN, date of birth, ID band Patient awake    Reviewed: Allergy & Precautions, H&P , NPO status , Patient's Chart, lab work & pertinent test results, reviewed documented beta blocker date and time   History of Anesthesia Complications (+) PONV and history of anesthetic complications  Airway Mallampati: II  TM Distance: >3 FB Neck ROM: full    Dental no notable dental hx.    Pulmonary neg pulmonary ROS, former smoker,    Pulmonary exam normal breath sounds clear to auscultation       Cardiovascular Exercise Tolerance: Good hypertension, negative cardio ROS   Rhythm:regular Rate:Normal     Neuro/Psych negative neurological ROS  negative psych ROS   GI/Hepatic negative GI ROS, Neg liver ROS,   Endo/Other  negative endocrine ROSdiabetes, Type 2  Renal/GU Renal disease  negative genitourinary   Musculoskeletal   Abdominal   Peds  Hematology negative hematology ROS (+)   Anesthesia Other Findings   Reproductive/Obstetrics negative OB ROS                             Anesthesia Physical Anesthesia Plan  ASA: II  Anesthesia Plan: General   Post-op Pain Management:    Induction:   PONV Risk Score and Plan: Ondansetron  Airway Management Planned:   Additional Equipment:   Intra-op Plan:   Post-operative Plan:   Informed Consent: I have reviewed the patients History and Physical, chart, labs and discussed the procedure including the risks, benefits and alternatives for the proposed anesthesia with the patient or authorized representative who has indicated his/her understanding and acceptance.     Dental Advisory Given  Plan Discussed with: CRNA  Anesthesia Plan Comments:         Anesthesia Quick Evaluation

## 2020-11-06 NOTE — Transfer of Care (Signed)
Immediate Anesthesia Transfer of Care Note  Patient: Sophia Young  Procedure(s) Performed: CYSTOSCOPY WITH RETROGRADE PYELOGRAM (Bilateral ) URETEROSCOPY- diagnostic (Left Ureter) CYSTOSCOPY WITH STENT REPLACEMENT (Left Ureter)  Patient Location: PACU  Anesthesia Type:General  Level of Consciousness: awake, alert , oriented and patient cooperative  Airway & Oxygen Therapy: Patient Spontanous Breathing and Patient connected to nasal cannula oxygen  Post-op Assessment: Report given to RN, Post -op Vital signs reviewed and stable and Patient moving all extremities X 4  Post vital signs: Reviewed and stable  Last Vitals:  Vitals Value Taken Time  BP    Temp    Pulse 100 11/06/20 0837  Resp 20 11/06/20 0837  SpO2 96 % 11/06/20 0837  Vitals shown include unvalidated device data.  Last Pain:  Vitals:   11/06/20 0657  TempSrc: Oral         Complications: No complications documented.

## 2020-11-06 NOTE — Op Note (Signed)
.  Preoperative diagnosis: Left hydronephrosis  Postoperative diagnosis: Same  Procedure: 1 cystoscopy 2.  Bilateral retrograde pyelography 3.  Intraoperative fluoroscopy, under one hour, with interpretation 4.  Left diagnostic ureteroscopy  5.  Left 6 x 24 JJ stent placement  Attending: Rosie Fate  Anesthesia: General  Estimated blood loss: None  Drains: Left 6 x 24 JJ ureteral stent without tether  Specimens: left renal pelvis urine culture  Antibiotics: ancef  Findings:  No right hydronephrosis. Moderate to severe left hydronephrosis to the UPJ. Debris in the left renal pelvis sent for urine culture. No masses/lesions in the bladder. Ureteral orifices in normal anatomic location.  Indications: Patient is a 65 year old female with a history of left hydronephrosis and gross hematuria  After discussing treatment options, she decided proceed with left diagnostic ureteroscopy.  Procedure in detail: The patient was brought to the operating room and a brief timeout was done to ensure correct patient, correct procedure, correct site.  General anesthesia was administered patient was placed in dorsal lithotomy position.  Her genitalia was then prepped and draped in usual sterile fashion.  A rigid 82 Olden cystoscope was passed in the urethra and the bladder.  Bladder was inspected free masses or lesions.  the ureteral orifices were in the normal orthotopic locations. a 6 [place]le[place] ureteral catheter was then instilled into the right ureteral orifice.  a gentle retrograde was obtained and findings noted above.   a 6 [place] ureteral catheter was then instilled into the left ureteral orifice.  a gentle retrograde was obtained and findings noted above.  we then placed a zip wire through the ureteral catheter and advanced up to the renal pelvis.  we then removed the cystoscope and cannulated the left ureteral orifice with a semirigid ureteroscope.  No stone was found in the ureter. Once we reached  the UPJ we advanced the ureteroscope into the renal pelvis. We encountered purulent urine which was aspirated and sent for culture.  We then placed a 6 x 24 double-j ureteral stent over the original zip wire.  We then removed the wire and good coil was noted in the the renal pelvis under fluoroscopy and the bladder under direct vision. the bladder was then drained and this concluded the procedure which was well tolerated by patient.  Complications: None  Condition: Stable, extubated, transferred to PACU  Plan: Patient is to be discharged home as to follow-up in 2 weeks for stent removal.

## 2020-11-06 NOTE — H&P (Signed)
Gross Hematuria  HPI: Sophia Young is a 65yo here for evaluation of gross hematuria. For the past 5-6 months she has noted grossly bloody urine 3 times. She does note blood when she wipes after urinating. She underwent CT abd/pelvis in 08/2020 which showed a left UPJ obstruction. She denies any left flank pain. She does no get frequent UTI. She has a hx of nephrolithiasis require stent placement and ureteroscopy.  UA today is concerning for infection. She saw a gynecologist and noted to have a normal pelvic exam.    PMH:     Past Medical History:  Diagnosis Date  . Chronic back pain   . Diabetes mellitus without complication ([place])   . Hydronephrosis of left kidney    chronic  . Hypercholesteremia   . Hypertension   . Ureteral stricture, left     Surgical History:      Past Surgical History:  Procedure Laterality Date  . ABDOMINAL HYSTERECTOMY    . BACK SURGERY    . BIOPSY  06/01/2017   Procedure: BIOPSY;  Surgeon: Daneil Dolin, MD;  [facility];  Service: Endoscopy;;  colon  . CHOLECYSTECTOMY    . COLONOSCOPY N/A 06/01/2017   Procedure: COLONOSCOPY;  Surgeon: Daneil Dolin, MD;  [facility];  Service: Endoscopy;  Laterality: N/A;  7:30 AM  . POLYPECTOMY  06/01/2017   Procedure: POLYPECTOMY;  Surgeon: Daneil Dolin, MD;  [facility];  Service: Endoscopy;;  colon   . SP DIL URETER Left 2011  . URETERAL STENT PLACEMENT Left 2011    Home Medications:  Allergies as of 09/22/2020   No Known Allergies        Medication List       Accurate as of September 22, 2020 11:20 AM. If you have any questions, ask your nurse or doctor.        STOP taking these medications   glipiZIDE 5 MG 24 hr tablet Commonly known as: GLUCOTROL XL Stopped by: Nicolette Bang, MD   metformin 1000 MG (OSM) 24 hr tablet Commonly known as: FORTAMET Stopped by: Nicolette Bang, MD     TAKE these medications   aspirin 81  MG chewable tablet Chew by mouth daily. What changed: Another medication with the same name was removed. Continue taking this medication, and follow the directions you see here. Changed by: Nicolette Bang, MD   hydrOXYzine 10 MG tablet Commonly known as: ATARAX/VISTARIL Take 10 mg by mouth at bedtime.   levothyroxine 75 MCG tablet Commonly known as: SYNTHROID Take 75 mcg by mouth daily before breakfast.   losartan 50 MG tablet Commonly known as: COZAAR Take 50 mg by mouth daily. What changed: Another medication with the same name was removed. Continue taking this medication, and follow the directions you see here. Changed by: Nicolette Bang, MD   pravastatin 40 MG tablet Commonly known as: PRAVACHOL Take 40 mg by mouth daily.   triamterene-hydrochlorothiazide 37.5-25 MG tablet Commonly known as: MAXZIDE-25 Take 1 tablet by mouth daily.   triamterene-hydrochlorothiazide 37.5-25 MG capsule Commonly known as: DYAZIDE Take 1 capsule by mouth daily.   Xigduo XR 06-999 MG Tb24 Generic drug: Dapagliflozin-metFORMIN HCl ER Take 1 tablet by mouth daily.       Allergies: No Known Allergies  Family History:      Family History  Problem Relation Age of Onset  . Colon cancer Neg Hx     Social History:  reports that she has quit smoking. She  has a 1.50 pack-year smoking history. She has never used smokeless tobacco. She reports that she does not drink alcohol and does not use drugs.  ROS: All other review of systems were reviewed and are negative except what is noted above in HPI  Physical Exam: BP (!) 151/84   Pulse 73   Temp 98.5 F (36.9 C)   Ht 5\' 3"  (1.6 m)   Wt 209 lb (94.8 kg)   BMI 37.02 kg/m   Constitutional:  Alert and oriented, No acute distress. HEENT: [place] AT, moist mucus membranes.  Trachea midline, no masses. Cardiovascular: No clubbing, cyanosis, or edema. Respiratory: Normal respiratory effort, no increased work of breathing. GI:  Abdomen is soft, nontender, nondistended, no abdominal masses GU: No CVA tenderness. Mild vaginal atrophy, no cystocele. Urethral caruncle at 5 oclock Lymph: No cervical or inguinal lymphadenopathy. Skin: No rashes, bruises or suspicious lesions. Neurologic: Grossly intact, no focal deficits, moving all 4 extremities. Psychiatric: Normal mood and affect.  Laboratory Data: Recent Labs       Lab Results  Component Value Date   WBC 7.7 02/13/2010   HGB 13.0 02/13/2010   HCT 37.4 02/13/2010   MCV 90.6 02/13/2010   PLT 200 02/13/2010      Recent Labs       Lab Results  Component Value Date   CREATININE 0.60 09/04/2020      Recent Labs  No results found for: PSA    Recent Labs  No results found for: TESTOSTERONE    Recent Labs  No results found for: HGBA1C    Urinalysis Labs (Brief)          Component Value Date/Time   COLORURINE RED BIOCHEMICALS MAY BE AFFECTED BY COLOR (A) 01/22/2010 0859   APPEARANCEUR CLOUDY (A) 01/22/2010 0859   LABSPEC 1.020 01/22/2010 0859   PHURINE 7.0 01/22/2010 0859   GLUCOSEU 100 (A) 01/22/2010 0859   HGBUR LARGE (A) 01/22/2010 0859   BILIRUBINUR MODERATE (A) 01/22/2010 0859   KETONESUR 15 (A) 01/22/2010 0859   PROTEINUR >300 (A) 01/22/2010 0859   UROBILINOGEN 4.0 (H) 01/22/2010 0859   NITRITE POSITIVE (A) 01/22/2010 0859   LEUKOCYTESUR MODERATE (A) 01/22/2010 0859      Recent Labs       Lab Results  Component Value Date   BACTERIA MANY (A) 01/22/2010      Pertinent Imaging: CT abd/pelvis 09/04/2020: Images reviewed and discussed with the patient Results for orders placed during the hospital encounter of 02/10/10  DG Abd 1 View  Narrative Clinical Data: Left renal calculus  ABDOMEN - 1 VIEW  Comparison: None Correlation:  CT abdomen pelvis 01/22/2010  Findings: Surgical clip left pelvis. Elongated calcification left pelvis, corresponding to a vascular calcification seen on  preceding CT. Facet degenerative changes lower lumbar spine. No definite urinary tract calcification identified. Bowel gas pattern normal. No acute bony findings.  IMPRESSION: No definite urinary tract calcification identified.  Provider: Jennye Boroughs  No results found for this or any previous visit.  No results found for this or any previous visit.  No results found for this or any previous visit.  No results found for this or any previous visit.  No results found for this or any previous visit.  No results found for this or any previous visit.  No results found for this or any previous visit.   Assessment & Plan:    1. Gross hematuria -Likely related to urethral caruncle - Urinalysis, Routine w reflex microscopic  2. Left  hydronephrosis/possible UPJ obstruction -We discussed the management of the patients hydronephrosis. In the setting of gross hematuria I would recommend diagnostic ureteroscopy to rule out malignancy. After discussing the procedure the patient wishes to proceed with left diagnostic ureteroscopy. Risks/benefits/alternatives discussed.

## 2020-11-07 ENCOUNTER — Encounter (HOSPITAL_COMMUNITY): Payer: Self-pay | Admitting: Urology

## 2020-11-08 LAB — URINE CULTURE: Culture: NO GROWTH

## 2020-11-09 ENCOUNTER — Telehealth: Payer: Self-pay | Admitting: Urology

## 2020-11-09 NOTE — Telephone Encounter (Signed)
Pt called - left stent / flank pain and urgency to void. Has a good stream but doesn't feel empty. Some blood in urine. No fever or emesis. Hasn't taken any meds. Rec she take pain med and an NSAID. Azo prn. If continued uncontrolled pain-->  ED to check KUB for stent position and PVR to ensure she's emptying.

## 2020-11-14 ENCOUNTER — Other Ambulatory Visit: Payer: Self-pay

## 2020-11-14 ENCOUNTER — Ambulatory Visit (INDEPENDENT_AMBULATORY_CARE_PROVIDER_SITE_OTHER): Payer: BC Managed Care – PPO | Admitting: Urology

## 2020-11-14 DIAGNOSIS — N131 Hydronephrosis with ureteral stricture, not elsewhere classified: Secondary | ICD-10-CM

## 2020-11-14 DIAGNOSIS — N3000 Acute cystitis without hematuria: Secondary | ICD-10-CM

## 2020-11-14 DIAGNOSIS — N321 Vesicointestinal fistula: Secondary | ICD-10-CM

## 2020-11-14 DIAGNOSIS — N390 Urinary tract infection, site not specified: Secondary | ICD-10-CM | POA: Diagnosis not present

## 2020-11-14 LAB — MICROSCOPIC EXAMINATION
RBC, Urine: >30 /hpf — AB (ref 0–2)
Renal Epithel, UA: NONE SEEN /hpf

## 2020-11-14 LAB — URINALYSIS, ROUTINE W REFLEX MICROSCOPIC
Bilirubin, UA: NEGATIVE
Ketones, UA: NEGATIVE
Nitrite, UA: POSITIVE — AB
Specific Gravity, UA: <1.005 — ABNORMAL LOW (ref 1.005–1.030)
Urobilinogen, Ur: 0.2 mg/dL (ref 0.2–1.0)
pH, UA: 6 (ref 5.0–7.5)

## 2020-11-14 MED ORDER — NITROFURANTOIN MONOHYD MACRO 100 MG PO CAPS: 100.0000 mg | ORAL_CAPSULE | Freq: Two times a day (BID) | ORAL | 0 refills | Status: DC

## 2020-11-14 NOTE — Progress Notes (Signed)

## 2020-11-14 NOTE — Progress Notes (Signed)
11/14/2020 1:44 PM   Sophia Young 12-10-1955 [id number]  Referring provider: Celene Squibb, MD [address],  [place] [address]  Followup left UPJ obstruction  HPI: [name] is a 65yo here for followup for left UPJ obstruction. She underwent stent placement 11/06/2020. She has intermittent left flank pain. UA today is concerning for infection. She has new onset fecal material draining in her vagina. It has been present for 1-2 week prior to her surgery.    PMH: Past Medical History:  Diagnosis Date   Chronic back pain    Complication of anesthesia    Diabetes mellitus without complication (H[facility])    Hydronephrosis of left kidney    chronic   Hypercholesteremia    Hypertension    PONV (postoperative nausea and vomiting)    Ureteral stricture, left     Surgical History: Past Surgical History:  Procedure Laterality Date   ABDOMINAL HYSTERECTOMY     BACK SURGERY     BIOPSY  06/01/2017   Procedure: BIOPSY;  Surgeon: Daneil Dolin, MD;  [facility];  Service: Endoscopy;;  colon   CHOLECYSTECTOMY     COLONOSCOPY N/A 06/01/2017   Procedure: COLONOSCOPY;  Surgeon: Daneil Dolin, MD;  [facility];  Service: Endoscopy;  Laterality: N/A;  7:30 AM   CYSTOSCOPY W/ RETROGRADES Bilateral 11/06/2020   Procedure: CYSTOSCOPY WITH RETROGRADE PYELOGRAM;  Surgeon: Cleon Gustin, MD;  Location: [facility];  Service: Urology;  Laterality: Bilateral;   CYSTOSCOPY W/ URETERAL STENT PLACEMENT Left 11/06/2020   Procedure: CYSTOSCOPY WITH STENT REPLACEMENT;  Surgeon: Cleon Gustin, MD;  Location: [facility];  Service: Urology;  Laterality: Left;   POLYPECTOMY  06/01/2017   Procedure: POLYPECTOMY;  Surgeon: Daneil Dolin, MD;  [facility];  Service: Endoscopy;;  colon    SP DIL URETER Left 2011   URETERAL STENT PLACEMENT Left 2011   URETEROSCOPY Left 11/06/2020   Procedure: URETEROSCOPY- diagnostic;  Surgeon: Cleon Gustin, MD;  Location: [facility];  Service: Urology;  Laterality: Left;    Home Medications:  Allergies as of 11/14/2020   No Known Allergies     Medication List       Accurate as of November 14, 2020  1:44 PM. If you have any questions, ask your nurse or doctor.        STOP taking these medications   cephALEXin 500 MG capsule Commonly known as: KEFLEX Stopped by: Nicolette Bang, MD   ondansetron 4 MG tablet Commonly known as: Zofran Stopped by: Nicolette Bang, MD     TAKE these medications   aspirin 81 MG chewable tablet Chew 81 mg by mouth daily.   cholecalciferol 25 MCG (1000 UNIT) tablet Commonly known as: VITAMIN D3 Take 1,000 Units by mouth daily.   glipiZIDE 5 MG 24 hr tablet Commonly known as: GLUCOTROL XL Take 5 mg by mouth daily with breakfast.   levothyroxine 75 MCG tablet Commonly known as: SYNTHROID Take 75 mcg by mouth daily before breakfast.   losartan 25 MG tablet Commonly known as: COZAAR Take 50 mg by mouth daily.   oxyCODONE-acetaminophen 5-325 MG tablet Commonly known as: Percocet Take 1 tablet by mouth every 4 (four) hours as needed for severe pain.   pravastatin 40 MG tablet Commonly known as: PRAVACHOL Take 40 mg by mouth daily.   tamsulosin 0.4 MG Caps capsule Commonly known as: Flomax Take 1 capsule (0.4 mg total) by mouth daily after  supper.   triamterene-hydrochlorothiazide 37.5-25 MG capsule Commonly known as: DYAZIDE Take 1 capsule by mouth daily.   Xigduo XR 06-999 MG Tb24 Generic drug: Dapagliflozin-metFORMIN HCl ER Take 1 tablet by mouth daily.       Allergies: No Known Allergies  Family History: Family History  Problem Relation Age of Onset   Colon cancer Neg Hx     Social History:  reports that she has quit smoking. She has a 1.50 pack-year smoking history. She has never used smokeless tobacco. She reports that she does not drink alcohol and does not use drugs.  ROS: All other review of systems were reviewed and  are negative except what is noted above in HPI  Physical Exam: There were no vitals taken for this visit.  Constitutional:  Alert and oriented, No acute distress. HEENT: [place] AT, moist mucus membranes.  Trachea midline, no masses. Cardiovascular: No clubbing, cyanosis, or edema. Respiratory: Normal respiratory effort, no increased work of breathing. GI: Abdomen is soft, nontender, nondistended, no abdominal masses GU: No CVA tenderness.  Lymph: No cervical or inguinal lymphadenopathy. Skin: No rashes, bruises or suspicious lesions. Neurologic: Grossly intact, no focal deficits, moving all 4 extremities. Psychiatric: Normal mood and affect.  Laboratory Data: Lab Results  Component Value Date   WBC 16.8 (H) 10/26/2020   HGB 15.6 (H) 10/26/2020   HCT 45.2 10/26/2020   MCV 89.3 10/26/2020   PLT 206 10/26/2020    Lab Results  Component Value Date   CREATININE 0.73 10/26/2020    No results found for: PSA  No results found for: TESTOSTERONE  No results found for: HGBA1C  Urinalysis    Component Value Date/Time   COLORURINE BROWN (A) 10/26/2020 1622   APPEARANCEUR CLOUDY (A) 10/26/2020 1622   APPEARANCEUR Hazy (A) 09/22/2020 1103   LABSPEC 1.023 10/26/2020 1622   PHURINE 6.0 10/26/2020 1622   GLUCOSEU >=500 (A) 10/26/2020 1622   HGBUR LARGE (A) 10/26/2020 1622   BILIRUBINUR NEGATIVE 10/26/2020 1622   BILIRUBINUR Negative 09/22/2020 1103   KETONESUR NEGATIVE 10/26/2020 1622   PROTEINUR >=300 (A) 10/26/2020 1622   UROBILINOGEN 4.0 (H) 01/22/2010 0859   NITRITE NEGATIVE 10/26/2020 1622   LEUKOCYTESUR MODERATE (A) 10/26/2020 1622    Lab Results  Component Value Date   LABMICR See below: 09/22/2020   WBCUA 6-10 (A) 09/22/2020   LABEPIT 0-10 09/22/2020   BACTERIA FEW (A) 10/26/2020    Pertinent Imaging:  Results for orders placed during the hospital encounter of 02/10/10  DG Abd 1 View  Narrative Clinical Data: Left renal calculus  ABDOMEN - 1  VIEW  Comparison: None Correlation:  CT abdomen pelvis 01/22/2010  Findings: Surgical clip left pelvis. Elongated calcification left pelvis, corresponding to a vascular calcification seen on preceding CT. Facet degenerative changes lower lumbar spine. No definite urinary tract calcification identified. Bowel gas pattern normal. No acute bony findings.  IMPRESSION: No definite urinary tract calcification identified.  Provider: Jennye Boroughs  No results found for this or any previous visit.  No results found for this or any previous visit.  No results found for this or any previous visit.  No results found for this or any previous visit.  No results found for this or any previous visit.  No results found for this or any previous visit.  No results found for this or any previous visit.   Assessment & Plan:    1. Hydronephrosis with ureteral stricture, not elsewhere classified -related to UPJ obstruction  - Urinalysis, Routine w  reflex microscopic  2. Concern for vesico-interstinal fistula/rectovaginal fistula -CT pelvis w/wo contrast   No follow-ups on file.  Nicolette Bang, MD  [facility] [place]

## 2020-11-17 ENCOUNTER — Other Ambulatory Visit: Payer: Self-pay | Admitting: Urology

## 2020-11-17 ENCOUNTER — Other Ambulatory Visit: Payer: Self-pay

## 2020-11-17 ENCOUNTER — Ambulatory Visit (HOSPITAL_COMMUNITY)
Admission: RE | Admit: 2020-11-17 | Discharge: 2020-11-17 | Disposition: A | Discharge status: Home / Self Care | Payer: BC Managed Care – PPO | Source: Ambulatory Visit | Attending: Urology | Admitting: Urology

## 2020-11-17 DIAGNOSIS — K5730 Diverticulosis of large intestine without perforation or abscess without bleeding: Secondary | ICD-10-CM | POA: Diagnosis not present

## 2020-11-17 DIAGNOSIS — K439 Ventral hernia without obstruction or gangrene: Secondary | ICD-10-CM | POA: Diagnosis not present

## 2020-11-17 DIAGNOSIS — N390 Urinary tract infection, site not specified: Secondary | ICD-10-CM

## 2020-11-17 DIAGNOSIS — N321 Vesicointestinal fistula: Secondary | ICD-10-CM | POA: Diagnosis not present

## 2020-11-17 DIAGNOSIS — N1339 Other hydronephrosis: Secondary | ICD-10-CM | POA: Diagnosis not present

## 2020-11-17 DIAGNOSIS — N131 Hydronephrosis with ureteral stricture, not elsewhere classified: Secondary | ICD-10-CM | POA: Diagnosis not present

## 2020-11-17 DIAGNOSIS — K469 Unspecified abdominal hernia without obstruction or gangrene: Secondary | ICD-10-CM | POA: Diagnosis not present

## 2020-11-17 MED ORDER — IOHEXOL 300 MG/ML  SOLN
100.0000 mL | Freq: Once | INTRAMUSCULAR | Status: AC | PRN
  Administered 2020-11-17: 100 mL via INTRAVENOUS

## 2020-11-18 ENCOUNTER — Telehealth: Payer: Self-pay

## 2020-11-18 NOTE — Telephone Encounter (Signed)
Left message for pt to return call.

## 2020-11-18 NOTE — Telephone Encounter (Signed)
-----   Message from Cleon Gustin, MD sent at 11/18/2020 10:04 AM EST ----- CT did not show a fistula ----- Message ----- From: Valentina Lucks, LPN Sent: 11/15/3297   2:01 PM EST To: Cleon Gustin, MD  Pls review.

## 2020-11-18 NOTE — Progress Notes (Signed)
Attempted to call pt. Left message to return call. 

## 2020-11-18 NOTE — Telephone Encounter (Signed)
Talked with pt and let her know I spoke with  Dr. Alyson Ingles and he will do a pelvic and cysto on Friday.

## 2020-11-18 NOTE — Telephone Encounter (Signed)
-----   Message from Cleon Gustin, MD sent at 11/18/2020 10:04 AM EST ----- CT did not show a fistula ----- Message ----- From: Valentina Lucks, LPN Sent: 03/13/5952   2:01 PM EST To: Cleon Gustin, MD  Pls review.

## 2020-11-19 LAB — URINE CULTURE

## 2020-11-21 ENCOUNTER — Encounter: Payer: Self-pay | Admitting: Urology

## 2020-11-21 ENCOUNTER — Ambulatory Visit (INDEPENDENT_AMBULATORY_CARE_PROVIDER_SITE_OTHER): Payer: BC Managed Care – PPO | Admitting: Urology

## 2020-11-21 ENCOUNTER — Other Ambulatory Visit: Payer: Self-pay

## 2020-11-21 VITALS — BP 158/83 | HR 71 | Temp 98.4°F | Ht 63.0 in | Wt 200.0 lb

## 2020-11-21 DIAGNOSIS — N823 Fistula of vagina to large intestine: Secondary | ICD-10-CM

## 2020-11-21 DIAGNOSIS — Q6239 Other obstructive defects of renal pelvis and ureter: Secondary | ICD-10-CM

## 2020-11-21 MED ORDER — CIPROFLOXACIN HCL 500 MG PO TABS: 500.0000 mg | ORAL_TABLET | Freq: Once | ORAL | Status: DC

## 2020-11-21 NOTE — Progress Notes (Signed)
Pt treated with Macrobid 11/14/20.

## 2020-11-21 NOTE — Progress Notes (Signed)
   11/21/20  CC: followup for stent removal  HPI: Ms Riedinger is a 65yo here for followup for stent removal Blood pressure (!) 158/83, pulse 71, temperature 98.4 F (36.9 C), height 5\' 3"  (1.6 m), weight 200 lb (90.7 kg). NED. A&Ox3.   No respiratory distress   Abd soft, NT, ND Normal external genitalia with patent urethral meatus  Cystoscopy Procedure Note  Patient identification was confirmed, informed consent was obtained, and patient was prepped using Betadine solution.  Lidocaine jelly was administered per urethral meatus.    Procedure: - Flexible cystoscope introduced, without any difficulty.   - Thorough search of the bladder revealed:    normal urethral meatus    normal urothelium    no stones    no ulcers     no tumors    no urethral polyps    no trabeculation  - Ureteral orifices were normal in position and appearance.  Using a grasper the left ureterals tent was removed intact. Vaginoscopy was performed and no fistula was visualized but feculent material was irrigated from the vagina.  Post-Procedure: - Patient tolerated the procedure well  Assessment/ Plan:    No follow-ups on file.  Nicolette Bang, MD

## 2020-11-21 NOTE — Progress Notes (Signed)

## 2020-11-25 ENCOUNTER — Encounter: Payer: Self-pay | Admitting: Urology

## 2020-11-25 NOTE — Patient Instructions (Signed)
Hydronephrosis  Hydronephrosis is the swelling of one or both kidneys due to a blockage that stops urine from flowing out of the body. Kidneys filter waste from the blood and produce urine. This condition can lead to kidney failure and may become life-threatening if not treated promptly. What are the causes? In infants and children, common causes include problems that occur when a baby is developing in the womb. These can include problems in the kidneys or in the tubes that drain urine into the bladder (ureters). In adults, common causes include:  Kidney stones.  Pregnancy.  A tumor or cyst in the abdomen or pelvis.  An enlarged prostate gland. Other causes include:  Bladder infection.  Scar tissue from a previous surgery or injury.  A blood clot.  Cancer of the prostate, bladder, uterus, ovary, or colon. What are the signs or symptoms? Symptoms of this condition include:  Pain or discomfort in your side (flank) or abdomen.  Swelling in your abdomen.  Nausea and vomiting.  Fever.  Pain when passing urine.  Feelings of urgency when you need to urinate.  Urinating more often than normal. In some cases, you may not have any symptoms. How is this diagnosed? This condition may be diagnosed based on:  Your symptoms and medical history.  A physical exam.  Blood and urine tests.  Imaging tests, such as an ultrasound, CT scan, or MRI.  A procedure to look at your urinary tract and bladder by inserting a scope into the urethra (cystoscopy). How is this treated? Treatment for this condition depends on where the blockage is, how long it has been there, and what caused it. The goal of treatment is to remove the blockage. Treatment may include:  Antibiotic medicines to treat or prevent infection.  A procedure to place a small, thin tube (stent) into a blocked ureter. The stent will keep the ureter open so that urine can drain through it.  A nonsurgical procedure that  crushes kidney stones with shock waves (extracorporeal shock wave lithotripsy).  If kidney failure occurs, treatment may include dialysis or a kidney transplant. Follow these instructions at home:  Take over-the-counter and prescription medicines only as told by your health care provider.  If you were prescribed an antibiotic medicine, take it exactly as told by your health care provider. Do not stop taking the antibiotic even if you start to feel better.  Rest and return to your normal activities as told by your health care provider. Ask your health care provider what activities are safe for you.  Drink enough fluid to keep your urine pale yellow.  Keep all follow-up visits. This is important.   Contact a health care provider if:  You continue to have symptoms after treatment.  You develop new symptoms.  Your urine becomes cloudy or bloody.  You have a fever. Get help right away if:  You have severe flank or abdominal pain.  You cannot drink fluids without vomiting. Summary  Hydronephrosis is the swelling of one or both kidneys due to a blockage that stops urine from flowing out of the body.  Hydronephrosis can lead to kidney failure and may become life-threatening if not treated promptly.  The goal of treatment is to remove the blockage. It may include a procedure to insert a stent into a blocked ureter, a procedure to break up kidney stones, or taking antibiotic medicines.  Follow your health care provider's instructions for taking care of yourself at home, including instructions about drinking fluids, taking   medicines, and limiting activities. This information is not intended to replace advice given to you by your health care provider. Make sure you discuss any questions you have with your health care provider. Document Revised: 12/18/2019 Document Reviewed: 12/18/2019 Elsevier Patient Education  2021 [employer]  

## 2020-11-27 ENCOUNTER — Other Ambulatory Visit: Payer: Self-pay

## 2020-11-27 ENCOUNTER — Ambulatory Visit (HOSPITAL_COMMUNITY)
Admission: RE | Admit: 2020-11-27 | Discharge: 2020-11-27 | Disposition: A | Discharge status: Home / Self Care | Payer: BC Managed Care – PPO | Source: Ambulatory Visit | Attending: Urology | Admitting: Urology

## 2020-11-27 DIAGNOSIS — N1330 Unspecified hydronephrosis: Secondary | ICD-10-CM | POA: Diagnosis not present

## 2020-11-27 DIAGNOSIS — Q6239 Other obstructive defects of renal pelvis and ureter: Secondary | ICD-10-CM | POA: Diagnosis not present

## 2020-11-28 ENCOUNTER — Other Ambulatory Visit: Payer: Self-pay | Admitting: Urology

## 2020-12-08 DIAGNOSIS — K146 Glossodynia: Secondary | ICD-10-CM | POA: Diagnosis not present

## 2020-12-08 DIAGNOSIS — I10 Essential (primary) hypertension: Secondary | ICD-10-CM | POA: Diagnosis not present

## 2020-12-08 DIAGNOSIS — E1169 Type 2 diabetes mellitus with other specified complication: Secondary | ICD-10-CM | POA: Diagnosis not present

## 2020-12-08 DIAGNOSIS — E119 Type 2 diabetes mellitus without complications: Secondary | ICD-10-CM | POA: Diagnosis not present

## 2020-12-08 DIAGNOSIS — R945 Abnormal results of liver function studies: Secondary | ICD-10-CM | POA: Diagnosis not present

## 2020-12-08 DIAGNOSIS — E1165 Type 2 diabetes mellitus with hyperglycemia: Secondary | ICD-10-CM | POA: Diagnosis not present

## 2020-12-08 DIAGNOSIS — R5383 Other fatigue: Secondary | ICD-10-CM | POA: Diagnosis not present

## 2020-12-08 DIAGNOSIS — E559 Vitamin D deficiency, unspecified: Secondary | ICD-10-CM | POA: Diagnosis not present

## 2020-12-10 DIAGNOSIS — E039 Hypothyroidism, unspecified: Secondary | ICD-10-CM | POA: Diagnosis not present

## 2020-12-10 DIAGNOSIS — E782 Mixed hyperlipidemia: Secondary | ICD-10-CM | POA: Diagnosis not present

## 2020-12-10 DIAGNOSIS — E1169 Type 2 diabetes mellitus with other specified complication: Secondary | ICD-10-CM | POA: Diagnosis not present

## 2020-12-10 DIAGNOSIS — I10 Essential (primary) hypertension: Secondary | ICD-10-CM | POA: Diagnosis not present

## 2020-12-11 ENCOUNTER — Other Ambulatory Visit: Payer: Self-pay | Admitting: Urology

## 2020-12-19 ENCOUNTER — Other Ambulatory Visit: Payer: Self-pay

## 2020-12-19 ENCOUNTER — Encounter: Payer: Self-pay | Admitting: Urology

## 2020-12-19 ENCOUNTER — Ambulatory Visit (INDEPENDENT_AMBULATORY_CARE_PROVIDER_SITE_OTHER): Payer: BC Managed Care – PPO | Admitting: Urology

## 2020-12-19 VITALS — BP 163/84 | HR 75 | Temp 98.8°F | Ht 63.0 in | Wt 200.0 lb

## 2020-12-19 DIAGNOSIS — N135 Crossing vessel and stricture of ureter without hydronephrosis: Secondary | ICD-10-CM | POA: Insufficient documentation

## 2020-12-19 DIAGNOSIS — Q6239 Other obstructive defects of renal pelvis and ureter: Secondary | ICD-10-CM | POA: Insufficient documentation

## 2020-12-19 DIAGNOSIS — N823 Fistula of vagina to large intestine: Secondary | ICD-10-CM

## 2020-12-19 LAB — URINALYSIS, ROUTINE W REFLEX MICROSCOPIC
Bilirubin, UA: NEGATIVE
Ketones, UA: NEGATIVE
Nitrite, UA: NEGATIVE
Specific Gravity, UA: 1.02 (ref 1.005–1.030)
Urobilinogen, Ur: 0.2 mg/dL (ref 0.2–1.0)
pH, UA: 5.5 (ref 5.0–7.5)

## 2020-12-19 LAB — MICROSCOPIC EXAMINATION
Renal Epithel, UA: NONE SEEN /hpf
WBC, UA: >30 /hpf — AB (ref 0–5)

## 2020-12-19 NOTE — Patient Instructions (Signed)
Hydronephrosis  Hydronephrosis is the swelling of one or both kidneys due to a blockage that stops urine from flowing out of the body. Kidneys filter waste from the blood and produce urine. This condition can lead to kidney failure and may become life-threatening if not treated promptly. What are the causes? In infants and children, common causes include problems that occur when a baby is developing in the womb. These can include problems in the kidneys or in the tubes that drain urine into the bladder (ureters). In adults, common causes include:  Kidney stones.  Pregnancy.  A tumor or cyst in the abdomen or pelvis.  An enlarged prostate gland. Other causes include:  Bladder infection.  Scar tissue from a previous surgery or injury.  A blood clot.  Cancer of the prostate, bladder, uterus, ovary, or colon. What are the signs or symptoms? Symptoms of this condition include:  Pain or discomfort in your side (flank) or abdomen.  Swelling in your abdomen.  Nausea and vomiting.  Fever.  Pain when passing urine.  Feelings of urgency when you need to urinate.  Urinating more often than normal. In some cases, you may not have any symptoms. How is this diagnosed? This condition may be diagnosed based on:  Your symptoms and medical history.  A physical exam.  Blood and urine tests.  Imaging tests, such as an ultrasound, CT scan, or MRI.  A procedure to look at your urinary tract and bladder by inserting a scope into the urethra (cystoscopy). How is this treated? Treatment for this condition depends on where the blockage is, how long it has been there, and what caused it. The goal of treatment is to remove the blockage. Treatment may include:  Antibiotic medicines to treat or prevent infection.  A procedure to place a small, thin tube (stent) into a blocked ureter. The stent will keep the ureter open so that urine can drain through it.  A nonsurgical procedure that  crushes kidney stones with shock waves (extracorporeal shock wave lithotripsy).  If kidney failure occurs, treatment may include dialysis or a kidney transplant. Follow these instructions at home:  Take over-the-counter and prescription medicines only as told by your health care provider.  If you were prescribed an antibiotic medicine, take it exactly as told by your health care provider. Do not stop taking the antibiotic even if you start to feel better.  Rest and return to your normal activities as told by your health care provider. Ask your health care provider what activities are safe for you.  Drink enough fluid to keep your urine pale yellow.  Keep all follow-up visits. This is important.   Contact a health care provider if:  You continue to have symptoms after treatment.  You develop new symptoms.  Your urine becomes cloudy or bloody.  You have a fever. Get help right away if:  You have severe flank or abdominal pain.  You cannot drink fluids without vomiting. Summary  Hydronephrosis is the swelling of one or both kidneys due to a blockage that stops urine from flowing out of the body.  Hydronephrosis can lead to kidney failure and may become life-threatening if not treated promptly.  The goal of treatment is to remove the blockage. It may include a procedure to insert a stent into a blocked ureter, a procedure to break up kidney stones, or taking antibiotic medicines.  Follow your health care provider's instructions for taking care of yourself at home, including instructions about drinking fluids, taking   medicines, and limiting activities. This information is not intended to replace advice given to you by your health care provider. Make sure you discuss any questions you have with your health care provider. Document Revised: 12/18/2019 Document Reviewed: 12/18/2019 Elsevier Patient Education  2021 [employer]  

## 2020-12-19 NOTE — Progress Notes (Signed)
Urological Symptom Review  Patient is experiencing the following symptoms: Get up at night to urinate  Kidney stones   Review of Systems  Gastrointestinal (upper)  : Negative for upper GI symptoms  Gastrointestinal (lower) : Negative for lower GI symptoms  Constitutional : Night Sweats  Skin: Negative for skin symptoms  Eyes: Negative for eye symptoms  Ear/Nose/Throat : Negative for Ear/Nose/Throat symptoms  Hematologic/Lymphatic: Negative for Hematologic/Lymphatic symptoms  Cardiovascular : Negative for cardiovascular symptoms  Respiratory : Negative for respiratory symptoms  Endocrine: Negative for endocrine symptoms  Musculoskeletal: Negative for musculoskeletal symptoms  Neurological: Negative for neurological symptoms  Psychologic: Negative for psychiatric symptoms

## 2020-12-19 NOTE — Progress Notes (Signed)
12/19/2020 1:29 PM   Sophia Young Oct 14, 1955 [id number]  Referring provider: Celene Squibb, MD [address] [place],  [place] [address]  followup left UPJ obstruction  HPI: [name] is a 65yo here for followup for left UPJ obstruction. She had been doing well since the left ureteral stent was removed. She denies any flank pain. No worsening LUTS. No hematuria or dysuria. She has noted feculent material in her vagina for the past week. She has an appointment with Dr. Lorra Hals on April 22nd.    PMH: Past Medical History:  Diagnosis Date  . Chronic back pain   . Complication of anesthesia   . Diabetes mellitus without complication ([place])   . Hydronephrosis of left kidney    chronic  . Hypercholesteremia   . Hypertension   . PONV (postoperative nausea and vomiting)   . Ureteral stricture, left     Surgical History: Past Surgical History:  Procedure Laterality Date  . ABDOMINAL HYSTERECTOMY    . BACK SURGERY    . BIOPSY  06/01/2017   Procedure: BIOPSY;  Surgeon: Daneil Dolin, MD;  [facility];  Service: Endoscopy;;  colon  . CHOLECYSTECTOMY    . COLONOSCOPY N/A 06/01/2017   Procedure: COLONOSCOPY;  Surgeon: Daneil Dolin, MD;  [facility];  Service: Endoscopy;  Laterality: N/A;  7:30 AM  . CYSTOSCOPY W/ RETROGRADES Bilateral 11/06/2020   Procedure: CYSTOSCOPY WITH RETROGRADE PYELOGRAM;  Surgeon: Cleon Gustin, MD;  Location: [facility];  Service: Urology;  Laterality: Bilateral;  . CYSTOSCOPY W/ URETERAL STENT PLACEMENT Left 11/06/2020   Procedure: CYSTOSCOPY WITH STENT REPLACEMENT;  Surgeon: Cleon Gustin, MD;  [facility]: [facility];  Service: Urology;  Laterality: Left;  . POLYPECTOMY  06/01/2017   Procedure: POLYPECTOMY;  Surgeon: Daneil Dolin, MD;  [facility];  Service: Endoscopy;;  colon   . SP DIL URETER Left 2011  . URETERAL STENT PLACEMENT Left 2011  . URETEROSCOPY Left 11/06/2020   Procedure: URETEROSCOPY-  diagnostic;  Surgeon: Cleon Gustin, MD;  [facility]: [facility];  Service: Urology;  Laterality: Left;    Home Medications:  Allergies as of 12/19/2020   No Known Allergies     Medication List       Accurate as of December 19, 2020  1:29 PM. If you have any questions, ask your nurse or doctor.        aspirin 81 MG chewable tablet Chew 81 mg by mouth daily.   cholecalciferol 25 MCG (1000 UNIT) tablet Commonly known as: VITAMIN D3 Take 1,000 Units by mouth daily.   fluconazole 150 MG tablet Commonly known as: DIFLUCAN Take by mouth.   glipiZIDE 5 MG 24 hr tablet Commonly known as: GLUCOTROL XL Take 5 mg by mouth daily with breakfast.   levothyroxine 75 MCG tablet Commonly known as: SYNTHROID Take 75 mcg by mouth daily before breakfast.   losartan 25 MG tablet Commonly known as: COZAAR Take 50 mg by mouth daily.   nitrofurantoin (macrocrystal-monohydrate) 100 MG capsule Commonly known as: MACROBID Take 1 capsule (100 mg total) by mouth every 12 (twelve) hours.   oxyCODONE-acetaminophen 5-325 MG tablet Commonly known as: Percocet Take 1 tablet by mouth every 4 (four) hours as needed for severe pain.   pravastatin 40 MG tablet Commonly known as: PRAVACHOL Take 40 mg by mouth daily.   tamsulosin 0.4 MG Caps capsule Commonly known as: FLOMAX TAKE 1 CAPSULE (0.4 MG TOTAL) BY MOUTH DAILY  AFTER SUPPER.   triamterene-hydrochlorothiazide 37.5-25 MG capsule Commonly known as: DYAZIDE Take 1 capsule by mouth daily.   Xigduo XR 06-999 MG Tb24 Generic drug: Dapagliflozin-metFORMIN HCl ER Take 1 tablet by mouth daily.       Allergies: No Known Allergies  Family History: Family History  Problem Relation Age of Onset  . Colon cancer Neg Hx     Social History:  reports that she has quit smoking. She has a 1.50 pack-year smoking history. She has never used smokeless tobacco. She reports that she does not drink alcohol and does not use drugs.  ROS: All other review  of systems were reviewed and are negative except what is noted above in HPI  Physical Exam: BP (!) 163/84   Pulse 75   Temp 98.8 F (37.1 C)   Ht 5\' 3"  (1.6 m)   Wt 200 lb (90.7 kg)   BMI 35.43 kg/m   Constitutional:  Alert and oriented, No acute distress. HEENT: [place] AT, moist mucus membranes.  Trachea midline, no masses. Cardiovascular: No clubbing, cyanosis, or edema. Respiratory: Normal respiratory effort, no increased work of breathing. GI: Abdomen is soft, nontender, nondistended, no abdominal masses GU: No CVA tenderness.  Lymph: No cervical or inguinal lymphadenopathy. Skin: No rashes, bruises or suspicious lesions. Neurologic: Grossly intact, no focal deficits, moving all 4 extremities. Psychiatric: Normal mood and affect.  Laboratory Data: Lab Results  Component Value Date   WBC 16.8 (H) 10/26/2020   HGB 15.6 (H) 10/26/2020   HCT 45.2 10/26/2020   MCV 89.3 10/26/2020   PLT 206 10/26/2020    Lab Results  Component Value Date   CREATININE 0.73 10/26/2020    No results found for: PSA  No results found for: TESTOSTERONE  No results found for: HGBA1C  Urinalysis    Component Value Date/Time   COLORURINE BROWN (A) 10/26/2020 1622   APPEARANCEUR Cloudy (A) 11/14/2020 1330   LABSPEC 1.023 10/26/2020 1622   PHURINE 6.0 10/26/2020 1622   GLUCOSEU 3+ (A) 11/14/2020 1330   HGBUR LARGE (A) 10/26/2020 1622   BILIRUBINUR Negative 11/14/2020 1330   KETONESUR NEGATIVE 10/26/2020 1622   PROTEINUR 2+ (A) 11/14/2020 1330   PROTEINUR >=300 (A) 10/26/2020 1622   UROBILINOGEN 4.0 (H) 01/22/2010 0859   NITRITE Positive (A) 11/14/2020 1330   NITRITE NEGATIVE 10/26/2020 1622   LEUKOCYTESUR 1+ (A) 11/14/2020 1330   LEUKOCYTESUR MODERATE (A) 10/26/2020 1622    Lab Results  Component Value Date   LABMICR See below: 11/14/2020   WBCUA 11-30 (A) 11/14/2020   LABEPIT 0-10 11/14/2020   BACTERIA Many (A) 11/14/2020    Pertinent Imaging: Renal US 11/27/2020:  Images  reviewed and discussed with the patient  Results for orders placed during the hospital encounter of 02/10/10  DG Abd 1 View  Narrative Clinical Data: Left renal calculus  ABDOMEN - 1 VIEW  Comparison: None Correlation:  CT abdomen pelvis 01/22/2010  Findings: Surgical clip left pelvis. Elongated calcification left pelvis, corresponding to a vascular calcification seen on preceding CT. Facet degenerative changes lower lumbar spine. No definite urinary tract calcification identified. Bowel gas pattern normal. No acute bony findings.  IMPRESSION: No definite urinary tract calcification identified.  Provider: Jennye Boroughs  No results found for this or any previous visit.  No results found for this or any previous visit.  No results found for this or any previous visit.  Results for orders placed during the hospital encounter of 11/27/20  Ultrasound renal complete  Narrative CLINICAL DATA:  Left UPJ obstruction  EXAM: RENAL / URINARY TRACT ULTRASOUND COMPLETE  COMPARISON:  CT scan November 17, 2020  FINDINGS: Right Kidney:  Renal measurements: 12.1 x 5.2 x 5.6 cm = volume: 182.7 mL. Echogenicity within normal limits. No mass or hydronephrosis visualized.  Left Kidney:  Renal measurements: 14.4 x 6.0 x 5.1 cm = volume: 233.5 mL. There is moderate persistent hydronephrosis with associated cortical thinning.  Bladder:  The postvoid residual 67.5 cc.  No other abnormalities.  Other:  None.  IMPRESSION: 1. Moderate hydronephrosis on the left which persists after voiding. There is apparent associated cortical thinning in some locations. Superiorly, the cortex measures approximately 6 mm in thickness. 2. The right kidney is normal. 3. 67.5 cc postvoid residual.   Electronically Signed By: Dorise Bullion III M.D On: 11/29/2020 21:48  No results found for this or any previous visit.  No results found for this or any previous visit.  No results found  for this or any previous visit.   Assessment & Plan:    1. Left UPJ obstruction -RTC 6 months with renal US - Urinalysis, Routine w reflex microscopic   No follow-ups on file.  Nicolette Bang, MD  [facility] [place]

## 2021-01-02 DIAGNOSIS — R350 Frequency of micturition: Secondary | ICD-10-CM | POA: Diagnosis not present

## 2021-01-02 DIAGNOSIS — N390 Urinary tract infection, site not specified: Secondary | ICD-10-CM | POA: Diagnosis not present

## 2021-01-06 ENCOUNTER — Ambulatory Visit (HOSPITAL_COMMUNITY): Payer: BC Managed Care – PPO

## 2021-02-12 DIAGNOSIS — N824 Other female intestinal-genital tract fistulae: Secondary | ICD-10-CM | POA: Diagnosis not present

## 2021-02-23 ENCOUNTER — Telehealth: Payer: Self-pay | Admitting: Internal Medicine

## 2021-02-23 NOTE — Telephone Encounter (Signed)
Error

## 2021-02-23 NOTE — Telephone Encounter (Signed)
Pt will need ov- can you get her in asap if you have any cancellations?

## 2021-02-23 NOTE — Telephone Encounter (Signed)
[phone] (cell)  patient is needing a tcs prior to surgery, her referral is with you.  She is concerned about getting it done prior to her surgery    she is a patient here

## 2021-02-24 NOTE — H&P (View-Only) (Signed)
Referring Provider: Dr. Nadeen Landau Primary Care Physician:  Celene Squibb, MD Primary Gastroenterologist:  Dr. Gala Romney  Chief Complaint  Patient presents with   Colonoscopy    Needs tcs prior to fisula repair    HPI:   Sophia Young is a 65 y.o. female presenting today at the request of Dr. Dema Severin with [facility] surgery for consult colonoscopy due to colovaginal fistula.  She has history of diverticulitis, adenomatous colon polyps, recurrent UTIs, chronic UPJ obstruction, hydronephrosis.  She has been following with urology more recently and underwent cystoscopy with retrograde pyelogram 2/24 with no right hydronephrosis, moderate to severe left hydronephrosis to the UPJ, debris in left renal pelvis sent for urine culture, no masses/lesions in the bladder, ureteral orifices normal.  She had left ureteral stent placed. There was question of colovesical vs colovaginal fistula. CT A/P w and w/o contrast on 3/7 did not show colovesical fistula, normal appearing colon. She had left sided double J ureteral stent in place but with persistent moderate hydronephrosis. On 3/11, she underwent cystoscopy with no significant findings and ureteral stent removed. Also had vaginoscopy with no evidence of fistula, but feculent material was irrigated from the vagina.  Reviewed information included in referral.  Patient saw Dr. Nadeen Landau with [facility] surgery on 02/12/2021.  Recommended colonoscopy prior to correction of colovaginal fistula.  Today:  Patient reports 7 months ago, she started having vaginal bleeding which was abnormal as she has had a hysterectomy.  This transition to passing stool from her vagina.  Symptoms have been getting progressively worse since that time.  She is very bothered by this and its severely impairing her quality of life.  She is no longer working due to this.  States she cannot go out anywhere.  She is also limiting her oral intake to try to limit stool  passage.  Primarily eating mashed potatoes and cauliflower.  She does still pass stool from her rectum.  In general, she has chronic history of mild constipation with bowel movements daily to every other day using MiraLAX as needed.  If she skips a couple of days of doing bowel movements and uses MiraLAX, she passes some harder stools, and then passes some looser stools thereafter.  Denies any history of diarrhea.  No bright red blood per rectum or black stools.  She does have intermittent lower abdominal pain 3-4 times a week prior to bowel movements that improves thereafter.  States this feels more like pressure.  Depends on how much she eats.  No fever or chills, chest pain, shortness of breath.  1st cousin with crohn's disease.  No family history of colon cancer.  Last colonoscopy September 2018 with one 6 mm polyp in the ascending colon, removed with a cold snare (tubular adenoma), diverticulosis in the entire examined colon, 3 x 3 cm area of mildly congested mucosa with hyperemia and possibly some pus coming out of this area biopsied (most consistent with diverticulitis, pathology also tubular adenoma). She was treated with Cipro and flagyl, recommended repeat in 5 years.    Past Medical History:  Diagnosis Date   Chronic back pain    Complication of anesthesia    Diabetes mellitus without complication ([place])    Hydronephrosis of left kidney    chronic   Hypercholesteremia    Hypertension    PONV (postoperative nausea and vomiting)    Ureteral stricture, left     Past Surgical History:  Procedure Laterality Date   ABDOMINAL HYSTERECTOMY  BACK SURGERY     BIOPSY  06/01/2017   Procedure: BIOPSY;  Surgeon: Daneil Dolin, MD;  [facility];  Service: Endoscopy;;  colon   CHOLECYSTECTOMY     COLONOSCOPY N/A 06/01/2017   Procedure: COLONOSCOPY;  Surgeon: Daneil Dolin, MD;  [facility];  Service: Endoscopy;  Laterality: N/A;  7:30 AM   CYSTOSCOPY W/  RETROGRADES Bilateral 11/06/2020   Procedure: CYSTOSCOPY WITH RETROGRADE PYELOGRAM;  Surgeon: Cleon Gustin, MD;  Location: [facility];  Service: Urology;  Laterality: Bilateral;   CYSTOSCOPY W/ URETERAL STENT PLACEMENT Left 11/06/2020   Procedure: CYSTOSCOPY WITH STENT REPLACEMENT;  Surgeon: Cleon Gustin, MD;  Location: [facility];  Service: Urology;  Laterality: Left;   POLYPECTOMY  06/01/2017   Procedure: POLYPECTOMY;  Surgeon: Daneil Dolin, MD;  [facility];  Service: Endoscopy;;  colon    SP DIL URETER Left 2011   URETERAL STENT PLACEMENT Left 2011   URETEROSCOPY Left 11/06/2020   Procedure: URETEROSCOPY- diagnostic;  Surgeon: Cleon Gustin, MD;  Location: [facility];  Service: Urology;  Laterality: Left;    Current Outpatient Medications  Medication Sig Dispense Refill   aspirin 81 MG chewable tablet Chew 81 mg by mouth daily.     cholecalciferol (VITAMIN D3) 25 MCG (1000 UNIT) tablet Take 1,000 Units by mouth daily.     glipiZIDE (GLUCOTROL XL) 5 MG 24 hr tablet Take 5 mg by mouth daily with breakfast.     levothyroxine (SYNTHROID, LEVOTHROID) 75 MCG tablet Take 75 mcg by mouth daily before breakfast.     losartan (COZAAR) 25 MG tablet Take 50 mg by mouth daily.     Multiple Vitamins-Minerals (CENTRUM SILVER 50+WOMEN) TABS Take 1 tablet by mouth daily.     pravastatin (PRAVACHOL) 40 MG tablet Take 40 mg by mouth daily.     triamterene-hydrochlorothiazide (DYAZIDE) 37.5-25 MG capsule Take 1 capsule by mouth daily.     XIGDUO XR 06-999 MG TB24 Take 1 tablet by mouth daily.     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  Tobacco Use   Smoking status: Former    Packs/day: 0.50    Years: 3.00    Pack years: 1.50    Types: Cigarettes   Smokeless tobacco: Never  Vaping Use   Vaping Use: Never used  Substance and Sexual Activity   Alcohol use: No   Drug use: No   Sexual activity: Not on file  Other Topics Concern   Not on file  Social History Narrative   Not on file   Social Determinants of Health   Financial Resource Strain: Not on file  Food Insecurity: Not on file  Transportation Needs: Not on file  Physical Activity: Not on file  Stress: Not on  file  Social Connections: Not on file  Intimate Partner Violence: Not on file    Review of Systems: Gen: Denies fever, chills, presyncope, syncope. CV: Denies chest pain or palpitations. Resp: Denies shortness of breath or cough. GI: See HPI GU : Denies urinary burning, urinary frequency, urinary hesitancy MS: Denies joint pain Derm: Denies rash Psych: Denies depression, anxiety Heme: See HPI  Physical Exam: BP (!) 172/78   Pulse 73   Temp (!) 97.3 F (36.3 C) (Temporal)   Ht 5\' 3"  (1.6 m)   Wt 197 lb 6.4 oz (89.5 kg)   BMI 34.97 kg/m  General:   Alert and oriented. Pleasant and cooperative. Well-nourished and well-developed.  Head:  Normocephalic and atraumatic. Eyes:  Without icterus, sclera clear and conjunctiva pink.  Ears:  Normal auditory acuity. Lungs:  Clear to auscultation bilaterally. No wheezes, rales, or rhonchi. No distress.  Heart:  S1, S2 present without murmurs appreciated.  Abdomen:  +BS, soft, non-tender and non-distended. No HSM noted. No guarding or rebound. No masses appreciated.  Rectal:  Deferred  Msk:  Symmetrical without gross deformities. Normal posture. Extremities:   Without edema. Neurologic:  Alert and  oriented x4;  grossly normal neurologically. Skin:  Intact without significant lesions or rashes. Psych:  Normal mood and affect.    Assessment: 65 year old female with history of diabetes, chronic left UPJ obstruction with hydronephrosis, HTN, HLD, adenomatous colon polyps presenting today at the request of Dr. Nadeen Landau for consult colonoscopy prior to surgery for colovaginal fistula.    Patient reports 7 months ago, she noticed abnormal vaginal bleeding with history of hysterectomy.  This transition to passing feculent material from her vagina.  Symptoms have been progressively worsening since that time and significantly impairing her quality of life. Also with intermittent pressure in the lower abdomen prior to bowel movements that improves thereafter.  Denies diarrhea. Has chronic mild intermittent constipation that is well managed with MiraLAX as needed.  Denies BRBPR, melena, family history of colon cancer.  First cousin with history of Crohn's disease.  Evaluation by urology with CT abdomen and pelvis with and without contrast in March 2022 did not demonstrate any evidence of fistula and her colon looked normal.  She had vaginoscopy with urology that also did not reveal any obvious fistula, but feculent material was irrigated from the vagina.  Clinically, she has colovaginal fistula.  She saw Dr. Dema Severin on 02/12/2021 with plans for surgical intervention; however, Dr. Dema Severin has requested updating a colonoscopy prior to surgery due to her history of adenomatous colon polyps.  Her last colonoscopy was in 2018 with a 6 mm tubular adenoma, pancolonic diverticulosis, and  3 x 3 cm area of mildly congested mucosa with hyperemia and possibly some pus coming out of this area biopsied (most consistent with diverticulitis, pathology also tubular adenoma).   As patient is needing colonoscopy ASAP, we will get her scheduled with Dr. Abbey Chatters rather than Dr. Gala Romney as he  does not have any openings in the next couple of weeks.  Plan: 1.  Colonoscopy with propofol with Dr. Abbey Chatters on 02/27/2021. The risks, benefits, and alternatives have been discussed with the patient in detail. The patient states understanding and desires to proceed.  ASA II See separate instructions for diabetes medication adjustments.  2.  Follow-up as needed.    Aliene Altes, PA-C [facility]

## 2021-02-24 NOTE — Telephone Encounter (Signed)
noted 

## 2021-02-24 NOTE — Progress Notes (Signed)
Referring Provider: Dr. Nadeen Landau Primary Care Physician:  Celene Squibb, MD Primary Gastroenterologist:  Dr. Gala Romney  Chief Complaint  Patient presents with   Colonoscopy    Needs tcs prior to fisula repair    HPI:   Sophia Young is a 65 y.o. female presenting today at the request of Dr. Dema Severin with [facility] surgery for consult colonoscopy due to colovaginal fistula.  She has history of diverticulitis, adenomatous colon polyps, recurrent UTIs, chronic UPJ obstruction, hydronephrosis.  She has been following with urology more recently and underwent cystoscopy with retrograde pyelogram 2/24 with no right hydronephrosis, moderate to severe left hydronephrosis to the UPJ, debris in left renal pelvis sent for urine culture, no masses/lesions in the bladder, ureteral orifices normal.  She had left ureteral stent placed. There was question of colovesical vs colovaginal fistula. CT A/P w and w/o contrast on 3/7 did not show colovesical fistula, normal appearing colon. She had left sided double J ureteral stent in place but with persistent moderate hydronephrosis. On 3/11, she underwent cystoscopy with no significant findings and ureteral stent removed. Also had vaginoscopy with no evidence of fistula, but feculent material was irrigated from the vagina.  Reviewed information included in referral.  Patient saw Dr. Nadeen Landau with [facility] surgery on 02/12/2021.  Recommended colonoscopy prior to correction of colovaginal fistula.  Today:  Patient reports 7 months ago, she started having vaginal bleeding which was abnormal as she has had a hysterectomy.  This transition to passing stool from her vagina.  Symptoms have been getting progressively worse since that time.  She is very bothered by this and its severely impairing her quality of life.  She is no longer working due to this.  States she cannot go out anywhere.  She is also limiting her oral intake to try to limit stool  passage.  Primarily eating mashed potatoes and cauliflower.  She does still pass stool from her rectum.  In general, she has chronic history of mild constipation with bowel movements daily to every other day using MiraLAX as needed.  If she skips a couple of days of doing bowel movements and uses MiraLAX, she passes some harder stools, and then passes some looser stools thereafter.  Denies any history of diarrhea.  No bright red blood per rectum or black stools.  She does have intermittent lower abdominal pain 3-4 times a week prior to bowel movements that improves thereafter.  States this feels more like pressure.  Depends on how much she eats.  No fever or chills, chest pain, shortness of breath.  1st cousin with crohn's disease.  No family history of colon cancer.  Last colonoscopy September 2018 with one 6 mm polyp in the ascending colon, removed with a cold snare (tubular adenoma), diverticulosis in the entire examined colon, 3 x 3 cm area of mildly congested mucosa with hyperemia and possibly some pus coming out of this area biopsied (most consistent with diverticulitis, pathology also tubular adenoma). She was treated with Cipro and flagyl, recommended repeat in 5 years.    Past Medical History:  Diagnosis Date   Chronic back pain    Complication of anesthesia    Diabetes mellitus without complication ([place])    Hydronephrosis of left kidney    chronic   Hypercholesteremia    Hypertension    PONV (postoperative nausea and vomiting)    Ureteral stricture, left     Past Surgical History:  Procedure Laterality Date   ABDOMINAL HYSTERECTOMY  BACK SURGERY     BIOPSY  06/01/2017   Procedure: BIOPSY;  Surgeon: Daneil Dolin, MD;  [facility];  Service: Endoscopy;;  colon   CHOLECYSTECTOMY     COLONOSCOPY N/A 06/01/2017   Procedure: COLONOSCOPY;  Surgeon: Daneil Dolin, MD;  Location: [facility];  Service: Endoscopy;  Laterality: N/A;  7:30 AM   CYSTOSCOPY W/  RETROGRADES Bilateral 11/06/2020   Procedure: CYSTOSCOPY WITH RETROGRADE PYELOGRAM;  Surgeon: Cleon Gustin, MD;  Location: [facility];  Service: Urology;  Laterality: Bilateral;   CYSTOSCOPY W/ URETERAL STENT PLACEMENT Left 11/06/2020   Procedure: CYSTOSCOPY WITH STENT REPLACEMENT;  Surgeon: Cleon Gustin, MD;  Location: [facility];  Service: Urology;  Laterality: Left;   POLYPECTOMY  06/01/2017   Procedure: POLYPECTOMY;  Surgeon: Daneil Dolin, MD;  [facility];  Service: Endoscopy;;  colon    SP DIL URETER Left 2011   URETERAL STENT PLACEMENT Left 2011   URETEROSCOPY Left 11/06/2020   Procedure: URETEROSCOPY- diagnostic;  Surgeon: Cleon Gustin, MD;  Location: [facility];  Service: Urology;  Laterality: Left;    Current Outpatient Medications  Medication Sig Dispense Refill   aspirin 81 MG chewable tablet Chew 81 mg by mouth daily.     cholecalciferol (VITAMIN D3) 25 MCG (1000 UNIT) tablet Take 1,000 Units by mouth daily.     glipiZIDE (GLUCOTROL XL) 5 MG 24 hr tablet Take 5 mg by mouth daily with breakfast.     levothyroxine (SYNTHROID, LEVOTHROID) 75 MCG tablet Take 75 mcg by mouth daily before breakfast.     losartan (COZAAR) 25 MG tablet Take 50 mg by mouth daily.     Multiple Vitamins-Minerals (CENTRUM SILVER 50+WOMEN) TABS Take 1 tablet by mouth daily.     pravastatin (PRAVACHOL) 40 MG tablet Take 40 mg by mouth daily.     triamterene-hydrochlorothiazide (DYAZIDE) 37.5-25 MG capsule Take 1 capsule by mouth daily.     XIGDUO XR 06-999 MG TB24 Take 1 tablet by mouth daily.     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  Tobacco Use   Smoking status: Former    Packs/day: 0.50    Years: 3.00    Pack years: 1.50    Types: Cigarettes   Smokeless tobacco: Never  Vaping Use   Vaping Use: Never used  Substance and Sexual Activity   Alcohol use: No   Drug use: No   Sexual activity: Not on file  Other Topics Concern   Not on file  Social History Narrative   Not on file   Social Determinants of Health   Financial Resource Strain: Not on file  Food Insecurity: Not on file  Transportation Needs: Not on file  Physical Activity: Not on file  Stress: Not on  file  Social Connections: Not on file  Intimate Partner Violence: Not on file    Review of Systems: Gen: Denies fever, chills, presyncope, syncope. CV: Denies chest pain or palpitations. Resp: Denies shortness of breath or cough. GI: See HPI GU : Denies urinary burning, urinary frequency, urinary hesitancy MS: Denies joint pain Derm: Denies rash Psych: Denies depression, anxiety Heme: See HPI  Physical Exam: BP (!) 172/78   Pulse 73   Temp (!) 97.3 F (36.3 C) (Temporal)   Ht 5\' 3"  (1.6 m)   Wt 197 lb 6.4 oz (89.5 kg)   BMI 34.97 kg/m  General:   Alert and oriented. Pleasant and cooperative. Well-nourished and well-developed.  Head:  Normocephalic and atraumatic. Eyes:  Without icterus, sclera clear and conjunctiva pink.  Ears:  Normal auditory acuity. Lungs:  Clear to auscultation bilaterally. No wheezes, rales, or rhonchi. No distress.  Heart:  S1, S2 present without murmurs appreciated.  Abdomen:  +BS, soft, non-tender and non-distended. No HSM noted. No guarding or rebound. No masses appreciated.  Rectal:  Deferred  Msk:  Symmetrical without gross deformities. Normal posture. Extremities:   Without edema. Neurologic:  Alert and  oriented x4;  grossly normal neurologically. Skin:  Intact without significant lesions or rashes. Psych:  Normal mood and affect.    Assessment: 65 year old female with history of diabetes, chronic left UPJ obstruction with hydronephrosis, HTN, HLD, adenomatous colon polyps presenting today at the request of Dr. Nadeen Landau for consult colonoscopy prior to surgery for colovaginal fistula.    Patient reports 7 months ago, she noticed abnormal vaginal bleeding with history of hysterectomy.  This transition to passing feculent material from her vagina.  Symptoms have been progressively worsening since that time and significantly impairing her quality of life. Also with intermittent pressure in the lower abdomen prior to bowel movements that improves thereafter.  Denies diarrhea. Has chronic mild intermittent constipation that is well managed with MiraLAX as needed.  Denies BRBPR, melena, family history of colon cancer.  First cousin with history of Crohn's disease.  Evaluation by urology with CT abdomen and pelvis with and without contrast in March 2022 did not demonstrate any evidence of fistula and her colon looked normal.  She had vaginoscopy with urology that also did not reveal any obvious fistula, but feculent material was irrigated from the vagina.  Clinically, she has colovaginal fistula.  She saw Dr. Dema Severin on 02/12/2021 with plans for surgical intervention; however, Dr. Dema Severin has requested updating a colonoscopy prior to surgery due to her history of adenomatous colon polyps.  Her last colonoscopy was in 2018 with a 6 mm tubular adenoma, pancolonic diverticulosis, and  3 x 3 cm area of mildly congested mucosa with hyperemia and possibly some pus coming out of this area biopsied (most consistent with diverticulitis, pathology also tubular adenoma).   As patient is needing colonoscopy ASAP, we will get her scheduled with Dr. Abbey Chatters rather than Dr. Gala Romney as he  does not have any openings in the next couple of weeks.  Plan: 1.  Colonoscopy with propofol with Dr. Abbey Chatters on 02/27/2021. The risks, benefits, and alternatives have been discussed with the patient in detail. The patient states understanding and desires to proceed.  ASA II See separate instructions for diabetes medication adjustments.  2.  Follow-up as needed.    Aliene Altes, PA-C [facility] Gastroenterology

## 2021-02-25 ENCOUNTER — Other Ambulatory Visit: Payer: Self-pay

## 2021-02-25 ENCOUNTER — Encounter: Payer: Self-pay | Admitting: Gastroenterology

## 2021-02-25 ENCOUNTER — Ambulatory Visit (INDEPENDENT_AMBULATORY_CARE_PROVIDER_SITE_OTHER): Payer: BC Managed Care – PPO | Admitting: Gastroenterology

## 2021-02-25 ENCOUNTER — Encounter: Payer: Self-pay | Admitting: *Deleted

## 2021-02-25 VITALS — BP 172/78 | HR 73 | Temp 97.3°F | Ht 63.0 in | Wt 197.4 lb

## 2021-02-25 DIAGNOSIS — N824 Other female intestinal-genital tract fistulae: Secondary | ICD-10-CM | POA: Diagnosis not present

## 2021-02-25 DIAGNOSIS — Z86010 Personal history of colonic polyps: Secondary | ICD-10-CM | POA: Insufficient documentation

## 2021-02-25 NOTE — Addendum Note (Signed)
Addended by: Cheron Every on: 02/25/2021 04:35 PM   Modules accepted: Orders

## 2021-02-25 NOTE — Patient Instructions (Signed)
We will arrange for you to have a colonoscopy in the near future with Dr. Abbey Chatters. 1 day prior to procedure: Take diabetes medications as prescribed. Day of your procedure: Do not take any morning diabetes medications.  Check your blood sugars frequently while you are on clear liquids and correct any low blood sugars with sugary clear liquids.  It was nice meeting you today! I am sorry you are having a rough time!   Aliene Altes, PA-C [facility] Gastroenterology

## 2021-02-26 ENCOUNTER — Other Ambulatory Visit (HOSPITAL_COMMUNITY)
Admission: RE | Admit: 2021-02-26 | Discharge: 2021-02-26 | Disposition: A | Discharge status: Home / Self Care | Payer: BC Managed Care – PPO | Source: Ambulatory Visit | Attending: Internal Medicine | Admitting: Internal Medicine

## 2021-02-26 DIAGNOSIS — Z9049 Acquired absence of other specified parts of digestive tract: Secondary | ICD-10-CM | POA: Diagnosis not present

## 2021-02-26 DIAGNOSIS — Z90710 Acquired absence of both cervix and uterus: Secondary | ICD-10-CM | POA: Diagnosis not present

## 2021-02-26 DIAGNOSIS — Z87440 Personal history of urinary (tract) infections: Secondary | ICD-10-CM | POA: Diagnosis not present

## 2021-02-26 DIAGNOSIS — Z8379 Family history of other diseases of the digestive system: Secondary | ICD-10-CM | POA: Diagnosis not present

## 2021-02-26 DIAGNOSIS — Z7982 Long term (current) use of aspirin: Secondary | ICD-10-CM | POA: Diagnosis not present

## 2021-02-26 DIAGNOSIS — N823 Fistula of vagina to large intestine: Secondary | ICD-10-CM | POA: Diagnosis not present

## 2021-02-26 DIAGNOSIS — Z7984 Long term (current) use of oral hypoglycemic drugs: Secondary | ICD-10-CM | POA: Diagnosis not present

## 2021-02-26 DIAGNOSIS — Z79899 Other long term (current) drug therapy: Secondary | ICD-10-CM | POA: Diagnosis not present

## 2021-02-26 DIAGNOSIS — K648 Other hemorrhoids: Secondary | ICD-10-CM | POA: Diagnosis not present

## 2021-02-26 DIAGNOSIS — Z87891 Personal history of nicotine dependence: Secondary | ICD-10-CM | POA: Diagnosis not present

## 2021-02-26 DIAGNOSIS — K5730 Diverticulosis of large intestine without perforation or abscess without bleeding: Secondary | ICD-10-CM | POA: Diagnosis not present

## 2021-02-26 DIAGNOSIS — N824 Other female intestinal-genital tract fistulae: Secondary | ICD-10-CM | POA: Insufficient documentation

## 2021-02-26 LAB — BASIC METABOLIC PANEL
Anion gap: 10 (ref 5–15)
BUN: 10 mg/dL (ref 8–23)
CO2: 28 mmol/L (ref 22–32)
Calcium: 10 mg/dL (ref 8.9–10.3)
Chloride: 98 mmol/L (ref 98–111)
Creatinine, Ser: 0.72 mg/dL (ref 0.44–1.00)
GFR, Estimated: >60 mL/min (ref >60)
Glucose, Bld: 178 mg/dL — ABNORMAL HIGH (ref 70–99)
Potassium: 3.4 mmol/L — ABNORMAL LOW (ref 3.5–5.1)
Sodium: 136 mmol/L (ref 135–145)

## 2021-02-27 ENCOUNTER — Ambulatory Visit (HOSPITAL_COMMUNITY): Payer: BC Managed Care – PPO | Admitting: Certified Registered Nurse Anesthetist

## 2021-02-27 ENCOUNTER — Other Ambulatory Visit: Payer: Self-pay

## 2021-02-27 ENCOUNTER — Encounter (HOSPITAL_COMMUNITY): Payer: Self-pay

## 2021-02-27 ENCOUNTER — Ambulatory Visit (HOSPITAL_COMMUNITY)
Admission: RE | Admit: 2021-02-27 | Discharge: 2021-02-27 | Disposition: A | Discharge status: Home / Self Care | Payer: BC Managed Care – PPO | Attending: Internal Medicine | Admitting: Internal Medicine

## 2021-02-27 ENCOUNTER — Encounter (HOSPITAL_COMMUNITY)
Admission: RE | Disposition: A | Discharge status: Home / Self Care | Payer: Self-pay | Source: Home / Self Care | Attending: Internal Medicine

## 2021-02-27 DIAGNOSIS — Z87891 Personal history of nicotine dependence: Secondary | ICD-10-CM | POA: Diagnosis not present

## 2021-02-27 DIAGNOSIS — K648 Other hemorrhoids: Secondary | ICD-10-CM | POA: Insufficient documentation

## 2021-02-27 DIAGNOSIS — Z7982 Long term (current) use of aspirin: Secondary | ICD-10-CM | POA: Insufficient documentation

## 2021-02-27 DIAGNOSIS — E119 Type 2 diabetes mellitus without complications: Secondary | ICD-10-CM | POA: Diagnosis not present

## 2021-02-27 DIAGNOSIS — Z87440 Personal history of urinary (tract) infections: Secondary | ICD-10-CM | POA: Diagnosis not present

## 2021-02-27 DIAGNOSIS — Z90710 Acquired absence of both cervix and uterus: Secondary | ICD-10-CM | POA: Insufficient documentation

## 2021-02-27 DIAGNOSIS — Z79899 Other long term (current) drug therapy: Secondary | ICD-10-CM | POA: Insufficient documentation

## 2021-02-27 DIAGNOSIS — Z8379 Family history of other diseases of the digestive system: Secondary | ICD-10-CM | POA: Insufficient documentation

## 2021-02-27 DIAGNOSIS — K5730 Diverticulosis of large intestine without perforation or abscess without bleeding: Secondary | ICD-10-CM

## 2021-02-27 DIAGNOSIS — Z9049 Acquired absence of other specified parts of digestive tract: Secondary | ICD-10-CM | POA: Insufficient documentation

## 2021-02-27 DIAGNOSIS — N823 Fistula of vagina to large intestine: Secondary | ICD-10-CM | POA: Insufficient documentation

## 2021-02-27 DIAGNOSIS — Z7984 Long term (current) use of oral hypoglycemic drugs: Secondary | ICD-10-CM | POA: Insufficient documentation

## 2021-02-27 HISTORY — PX: COLONOSCOPY WITH PROPOFOL: SHX5780

## 2021-02-27 LAB — GLUCOSE, CAPILLARY: Glucose-Capillary: 182 mg/dL — ABNORMAL HIGH (ref 70–99)

## 2021-02-27 SURGERY — COLONOSCOPY WITH PROPOFOL
Anesthesia: General

## 2021-02-27 MED ORDER — LIDOCAINE HCL (CARDIAC) PF 100 MG/5ML IV SOSY
PREFILLED_SYRINGE | INTRAVENOUS | Status: DC | PRN
  Administered 2021-02-27: 50 mg via INTRAVENOUS

## 2021-02-27 MED ORDER — STERILE WATER FOR IRRIGATION IR SOLN
Status: DC | PRN
  Administered 2021-02-27: 1.5 mL

## 2021-02-27 MED ORDER — PROPOFOL 10 MG/ML IV BOLUS
INTRAVENOUS | Status: DC | PRN
  Administered 2021-02-27: 40 mg via INTRAVENOUS
  Administered 2021-02-27: 60 mg via INTRAVENOUS
  Administered 2021-02-27: 100 mg via INTRAVENOUS

## 2021-02-27 MED ORDER — PROPOFOL 500 MG/50ML IV EMUL
INTRAVENOUS | Status: DC | PRN
  Administered 2021-02-27: 150 ug/kg/min via INTRAVENOUS

## 2021-02-27 MED ORDER — LACTATED RINGERS IV SOLN: INTRAVENOUS | Status: DC

## 2021-02-27 NOTE — Anesthesia Preprocedure Evaluation (Signed)
Anesthesia Evaluation  Patient identified by MRN, date of birth, ID band Patient awake    Reviewed: Allergy & Precautions, H&P , NPO status , Patient's Chart, lab work & pertinent test results, reviewed documented beta blocker date and time   History of Anesthesia Complications (+) PONV  Airway Mallampati: II  TM Distance: >3 FB Neck ROM: full    Dental no notable dental hx.    Pulmonary neg pulmonary ROS, former smoker,    Pulmonary exam normal breath sounds clear to auscultation       Cardiovascular Exercise Tolerance: Good hypertension, negative cardio ROS   Rhythm:regular Rate:Normal     Neuro/Psych negative neurological ROS  negative psych ROS   GI/Hepatic negative GI ROS, Neg liver ROS,   Endo/Other  negative endocrine ROSdiabetes  Renal/GU Renal disease  negative genitourinary   Musculoskeletal   Abdominal   Peds  Hematology negative hematology ROS (+)   Anesthesia Other Findings   Reproductive/Obstetrics negative OB ROS                             Anesthesia Physical Anesthesia Plan  ASA: 2  Anesthesia Plan: General   Post-op Pain Management:    Induction:   PONV Risk Score and Plan: Propofol infusion  Airway Management Planned:   Additional Equipment:   Intra-op Plan:   Post-operative Plan:   Informed Consent: I have reviewed the patients History and Physical, chart, labs and discussed the procedure including the risks, benefits and alternatives for the proposed anesthesia with the patient or authorized representative who has indicated his/her understanding and acceptance.     Dental Advisory Given  Plan Discussed with: CRNA  Anesthesia Plan Comments:         Anesthesia Quick Evaluation

## 2021-02-27 NOTE — Interval H&P Note (Signed)
History and Physical Interval Note:  02/27/2021 9:32 AM  Sophia Young  has presented today for surgery, with the diagnosis of colovaginal fistula.  The various methods of treatment have been discussed with the patient and family. After consideration of risks, benefits and other options for treatment, the patient has consented to  Procedure(s) with comments: COLONOSCOPY WITH PROPOFOL (N/A) - 10:30am as a surgical intervention.  The patient's history has been reviewed, patient examined, no change in status, stable for surgery.  I have reviewed the patient's chart and labs.  Questions were answered to the patient's satisfaction.     Eloise Harman

## 2021-02-27 NOTE — Op Note (Signed)
[facility] Patient Name: Sophia Young Procedure Date: 02/27/2021 9:55 AM MRN: [id number] Date of Birth: 1955-09-26 Attending MD: Elon Alas. Abbey Chatters DO CSN: [id number] Age: 65 Admit Type: Outpatient Procedure:                Colonoscopy Indications:              colovaginal fistula Providers:                Elon Alas. Abbey Chatters, DO, Charlsie Quest. Theda Sers RN, RN,                            Aram Candela Referring MD:              Medicines:                See the Anesthesia note for documentation of the                            administered medications Complications:            No immediate complications. Estimated Blood Loss:     Estimated blood loss: none. Procedure:                Pre-Anesthesia Assessment:                           - The anesthesia plan was to use monitored                            anesthesia care (MAC).                           After obtaining informed consent, the colonoscope                            was passed under direct vision. Throughout the                            procedure, the patient's blood pressure, pulse, and                            oxygen saturations were monitored continuously. The                            [id number]) was introduced through the anus                            and advanced to the the cecum, identified by                            appendiceal orifice and ileocecal valve. The                            colonoscopy was performed without difficulty. The                            patient tolerated the procedure well. The quality  of the bowel preparation was evaluated using the                            BBPS [facility] Bowel Preparation Scale) with scores                            of: Right Colon = 3, Transverse Colon = 3 and Left                            Colon = 3 (entire mucosa seen well with no residual                            staining, small fragments of stool or opaque                             liquid). The total BBPS score equals 9. Scope In: 10:07:48 AM Scope Out: 10:34:07 AM Scope Withdrawal Time: 0 hours 18 minutes 29 seconds  Total Procedure Duration: 0 hours 26 minutes 19 seconds  Findings:      Non-bleeding internal hemorrhoids were found during retroflexion.      Multiple small-mouthed diverticula were found in the sigmoid colon and       descending colon.      I closely inspected every diverticula found in the sigmoid and       descending colon's. Did not find any obvious tracts consistent with       colovaginal fistula. I then filled the sigmoid colon and rectum with       sterile water and watch closely for potential leakage or bubbles and was       unsuccessful in finding fistula. Impression:               - Non-bleeding internal hemorrhoids.                           - Diverticulosis in the sigmoid colon and in the                            descending colon.                           - No specimens collected. Moderate Sedation:      Per Anesthesia Care Recommendation:           - Patient has a contact number available for                            emergencies. The signs and symptoms of potential                            delayed complications were discussed with the                            patient. Return to normal activities tomorrow.                            Written discharge instructions were provided  to the                            patient.                           - Resume previous diet.                           - Continue present medications.                           - Repeat colonoscopy in 10 years for screening                            purposes.                           - I closely inspected every diverticula found in                            the sigmoid and descending colon's. Did not find                            any obvious tracts consistent with colovaginal                            fistula. I then filled the  sigmoid colon and rectum                            with sterile water and watched closely for                            potential leakage or bubbles and was unsuccessful                            in finding fistula.                           - Return to surgery to discuss potential options. Procedure Code(s):        --- Professional ---                           [phone], Colonoscopy, flexible; diagnostic, including                            collection of specimen(s) by brushing or washing,                            when performed (separate procedure) Diagnosis Code(s):        --- Professional ---                           D82.6, Other hemorrhoids                           K57.30, Diverticulosis  of large intestine without                            perforation or abscess without bleeding CPT copyright 2019 American Medical Association. All rights reserved. The codes documented in this report are preliminary and upon coder review may  be revised to meet current compliance requirements. Elon Alas. Abbey Chatters, DO [place] Fleeta Kunde, DO 02/27/2021 10:51:01 AM This report has been signed electronically. Number of Addenda: 0

## 2021-02-27 NOTE — Discharge Instructions (Addendum)
  Colonoscopy Discharge Instructions  Read the instructions outlined below and refer to this sheet in the next few weeks. These discharge instructions provide you with general information on caring for yourself after you leave the hospital. Your doctor may also give you specific instructions. While your treatment has been planned according to the most current medical practices available, unavoidable complications occasionally occur.   ACTIVITY You may resume your regular activity, but move at a slower pace for the next 24 hours.  Take frequent rest periods for the next 24 hours.  Walking will help get rid of the air and reduce the bloated feeling in your belly (abdomen).  No driving for 24 hours (because of the medicine (anesthesia) used during the test).   Do not sign any important legal documents or operate any machinery for 24 hours (because of the anesthesia used during the test).  NUTRITION Drink plenty of fluids.  You may resume your normal diet as instructed by your doctor.  Begin with a light meal and progress to your normal diet. Heavy or fried foods are harder to digest and may make you feel sick to your stomach (nauseated).  Avoid alcoholic beverages for 24 hours or as instructed.  MEDICATIONS You may resume your normal medications unless your doctor tells you otherwise.  WHAT YOU CAN EXPECT TODAY Some feelings of bloating in the abdomen.  Passage of more gas than usual.  Spotting of blood in your stool or on the toilet paper.  IF YOU HAD POLYPS REMOVED DURING THE COLONOSCOPY: No aspirin products for 7 days or as instructed.  No alcohol for 7 days or as instructed.  Eat a soft diet for the next 24 hours.  FINDING OUT THE RESULTS OF YOUR TEST Not all test results are available during your visit. If your test results are not back during the visit, make an appointment with your caregiver to find out the results. Do not assume everything is normal if you have not heard from your  caregiver or the medical facility. It is important for you to follow up on all of your test results.  SEEK IMMEDIATE MEDICAL ATTENTION IF: You have more than a spotting of blood in your stool.  Your belly is swollen (abdominal distention).  You are nauseated or vomiting.  You have a temperature over 101.  You have abdominal pain or discomfort that is severe or gets worse throughout the day.   I spent a long time examining your rectum, sigmoid, and descending colon's looking for potential fistula.  I was unsuccessful in locating any fistula.  I then filled your rectum and sigmoid colon with sterile water to monitor for leaking and/or bubbles and was again unsuccessful in identifying any potential fistula.  We will have you follow-up with your surgeon to discuss potential options despite negative colonoscopy today.  I did not find a polyps or evidence of colon cancer.  I recommend repeating colonoscopy in 10 years.  I hope you have a great rest of your week!  Elon Alas. Abbey Chatters, D.O. Gastroenterology and Hepatology [facility]

## 2021-02-27 NOTE — Anesthesia Postprocedure Evaluation (Signed)
Anesthesia Post Note  Patient: Anjelique Makar [place]  Procedure(s) Performed: COLONOSCOPY WITH PROPOFOL  Patient location during evaluation: Phase II Anesthesia Type: General Level of consciousness: awake Pain management: pain level controlled Vital Signs Assessment: post-procedure vital signs reviewed and stable Respiratory status: spontaneous breathing and respiratory function stable Cardiovascular status: blood pressure returned to baseline and stable Postop Assessment: no headache and no apparent nausea or vomiting Anesthetic complications: no Comments: Late entry   No notable events documented.   Last Vitals:  Vitals:   02/27/21 1037 02/27/21 1039  BP: (!) 96/44 (!) 104/41  Pulse:    Resp: 16   Temp: 36.4 C   SpO2: 95%     Last Pain:  Vitals:   02/27/21 1039  TempSrc:   PainSc: 0-No pain                 Louann Sjogren

## 2021-02-27 NOTE — Transfer of Care (Signed)
Immediate Anesthesia Transfer of Care Note  Patient: Sophia Young  Procedure(s) Performed: COLONOSCOPY WITH PROPOFOL  Patient Location: Short Stay  Anesthesia Type:General  Level of Consciousness: drowsy  Airway & Oxygen Therapy: Patient Spontanous Breathing  Post-op Assessment: Report given to RN and Post -op Vital signs reviewed and stable  Post vital signs: Reviewed and stable  Last Vitals:  Vitals Value Taken Time  BP    Temp    Pulse    Resp    SpO2      Last Pain:  Vitals:   02/27/21 1009  TempSrc:   PainSc: 3       Patients Stated Pain Goal: 2 (69/48/54 [id number])  Complications: No notable events documented.

## 2021-03-05 ENCOUNTER — Encounter (HOSPITAL_COMMUNITY): Payer: Self-pay | Admitting: Internal Medicine

## 2021-03-10 ENCOUNTER — Other Ambulatory Visit: Payer: Self-pay | Admitting: Urology

## 2021-04-02 ENCOUNTER — Ambulatory Visit: Payer: Self-pay | Admitting: Surgery

## 2021-04-02 NOTE — H&P (Signed)
CC: Referred for colovaginal fistula, history of diverticulitis  HPI: Sophia Young is a very pleasant 65yoF hx HTN, DM (on PO hypoglycemics), HLD presents to our office or evaluation of a possible colovaginal fistula.  She has been followed with Dr. Noah Delaine for a left ureteral UPJ obstruction.  She has had recurrent urinary tract infections.  She had a ureteral stent in place because of double-J stent for approximately 3 weeks.  This was removed.  She had interval CT scans completed while the stent was in place that demonstrated the left ureter to be away from her sigmoid colon and associated prior inflammatory areas.  For proximally 7 months she has struggled with stool and gas passing from her vagina.  She otherwise has good control from a anorectal perspective and even if having loose stool can use the restroom when convenient without having any accidents.  Because of the stool leaking from the vagina she will wear a pad.  She has also noticed flatus passing through this route as well.  This has caused significant disturbances in her quality of life and she is currently not working.  She had vaginoscopy completed with Dr. Noah Delaine as well as demonstrates feculent material within the vaginal vault.  She reports that she does carry a diagnosis of having had diverticulitis before.  She reports 2 separate attacks of pain and being told that she had some inflammation in her colon.  She was treated antibiotics and had resolution.  She denies any history of percutaneous drain placements or known abscesses.  She also has a history of issues with her left ureter dating back to at least 2011 and has had recurrent UTIs that was felt to be related to a possible intrinsic stenosis of her left ureter that again well away from her sigmoid colon on recent imaging.  Colonoscopy with Dr. Gala Romney 05/2017 showed a sectional area polyp that was removed from the ascending colon.  Diverticulosis and the entire colon.  Congested  mucosa and distal descending query focal diverticulitis.  Biopsied.  The segment returned with inflammation and reactive changes without adenomatous change.  The ascending colon polyp was a tubular adenoma.  PMH: HTN, DM (on PO hypoglycemics), HLD  PSH: Hysterectomy via Pfannenstiel incision.  Laparoscopic cholecystectomy.  FHx: Denies FHx of colorectal, breast, endometrial, ovarian or cervical cancer  Social: Denies use of tobacco/EtOH/drugs. Usually works for [employer].  ROS: A comprehensive 10 system review of systems was completed with the patient and pertinent findings as noted above.  The patient is a 65 year old female.   Past Surgical History Janeann Forehand, CNA; 02/12/2021 1:33 PM) Colon Polyp Removal - Colonoscopy   Gallbladder Surgery - Laparoscopic   Hysterectomy (not due to cancer) - Complete   Spinal Surgery - Lower Back    Diagnostic Studies History Janeann Forehand, CNA; 02/12/2021 1:33 PM) Colonoscopy   1-5 years ago Mammogram   within last year Pap Smear   1-5 years ago  Allergies Janeann Forehand, CNA; 02/12/2021 1:37 PM) No Known Drug Allergies   [02/12/2021]: Allergies Reconciled    Medication History Janeann Forehand, CNA; 02/12/2021 1:35 PM) Albuterol Sulfate HFA  (108 (90 Base)MCG/ACT Aerosol Soln, Inhalation) Active. Cephalexin  (500MG  Capsule, Oral) Active. Fluconazole  (150MG  Tablet, Oral) Active. glipiZIDE ER  (5MG  Tablet ER 24HR, Oral) Active. hydrOXYzine HCl  (10MG  Tablet, Oral) Active. Levothyroxine Sodium  (75MCG Tablet, Oral) Active. Losartan Potassium  (25MG  Tablet, Oral) Active. Nitrofurantoin Monohyd Macro  (100MG  Capsule, Oral) Active. Ondansetron HCl  (4MG  Tablet,  Oral) Active. Pravastatin Sodium  (40MG  Tablet, Oral) Active. Tamsulosin HCl  (0.4MG  Capsule, Oral) Active. Triamterene-HCTZ  (37.5-25MG  Capsule, Oral) Active. Xigduo XR  (10-1000MG  Tablet ER 24HR, Oral) Active. Medications Reconciled   Social History Janeann Forehand, CNA; 02/12/2021  1:33 PM) Caffeine use   Coffee. No alcohol use   No drug use   Tobacco use   Former smoker.  Family History Janeann Forehand, CNA; 02/12/2021 1:33 PM) Alcohol Abuse   Brother. Cerebrovascular Accident   Son. Diabetes Mellitus   Brother. Heart Disease   Father. Prostate Cancer   Brother. Respiratory Condition   Mother. Seizure disorder   Brother, Son.  Pregnancy / Birth History Janeann Forehand, CNA; 02/12/2021 1:33 PM) Age at menarche   24 years. Age of menopause   <45 Contraceptive History   Oral contraceptives. Gravida   2 Length (months) of breastfeeding   3-6 Maternal age   78-30 Para   89  Other Problems Janeann Forehand, CNA; 02/12/2021 1:33 PM) Diabetes Mellitus   Diverticulosis   High blood pressure   Hypercholesterolemia   Kidney Stone   Oophorectomy   Bilateral. Thyroid Disease      Review of Systems Janeann Forehand CNA; 02/12/2021 1:33 PM) General Present- Night Sweats. Not Present- Appetite Loss, Chills, Fatigue, Fever, Weight Gain and Weight Loss. Skin Not Present- Change in Wart/Mole, Dryness, Hives, Jaundice, New Lesions, Non-Healing Wounds, Rash and Ulcer. HEENT Present- Ringing in the Ears. Not Present- Earache, Hearing Loss, Hoarseness, Nose Bleed, Oral Ulcers, Seasonal Allergies, Sinus Pain, Sore Throat, Visual Disturbances, Wears glasses/contact lenses and Yellow Eyes. Respiratory Present- Snoring. Not Present- Bloody sputum, Chronic Cough, Difficulty Breathing and Wheezing. Cardiovascular Not Present- Chest Pain, Difficulty Breathing Lying Down, Leg Cramps, Palpitations, Rapid Heart Rate, Shortness of Breath and Swelling of Extremities. Gastrointestinal Present- Change in Bowel Habits and Hemorrhoids. Not Present- Abdominal Pain, Bloating, Bloody Stool, Chronic diarrhea, Constipation, Difficulty Swallowing, Excessive gas, Gets full quickly at meals, Indigestion, Nausea, Rectal Pain and Vomiting. Female Genitourinary Not Present- Frequency, Nocturia, Painful  Urination, Pelvic Pain and Urgency. Musculoskeletal Not Present- Back Pain, Joint Pain, Joint Stiffness, Muscle Pain, Muscle Weakness and Swelling of Extremities. Neurological Not Present- Decreased Memory, Fainting, Headaches, Numbness, Seizures, Tingling, Tremor, Trouble walking and Weakness. Psychiatric Present- Anxiety. Not Present- Bipolar, Change in Sleep Pattern, Depression, Fearful and Frequent crying. Endocrine Not Present- Cold Intolerance, Excessive Hunger, Hair Changes, Heat Intolerance, Hot flashes and New Diabetes. Hematology Not Present- Blood Thinners, Easy Bruising, Excessive bleeding, Gland problems, HIV and Persistent Infections.  Vitals (Donyelle Alston CNA; 02/12/2021 1:36 PM) 02/12/2021 1:35 PM Weight: 201.25 lb   Height: 63 in  Body Surface Area: 1.94 m   Body Mass Index: 35.65 kg/m   Temp.: 98.3 F    Pulse: 80 (Regular)    P.OX: 98% (Room air) BP: 190/90(Sitting, Left Arm, Standard)       Physical Exam Harrell Gave M. Artur Winningham MD; 02/12/2021 1:53 PM) The physical exam findings are as follows: Note:  Constitutional: No acute distress; conversant; no deformities Eyes: Moist conjunctiva; no lid lag; anicteric sclerae; pupils equal and round Neck: Trachea midline; no palpable thyromegaly Lungs: Normal respiratory effort; no tactile fremitus CV: ttt; no palpable thrill; no pitting edema GI: Abdomen soft, nontender, nondistended; no palpable hepatosplenomegaly Anorectal: Normal perianal skin and anal tone/squeeze. Vaginal vault with stool present. MSK: Normal gait; no clubbing/cyanosis Psychiatric: Appropriate affect; alert and oriented 3 Lymphatic: No palpable cervical or axillary lymphadenopathy **A chaperone, Altamese Cabal CMA, was present for this encounter  Assessment & Plan Harrell Gave M. Kadeen Sroka MD; 02/12/2021 2:13 PM) COLOVAGINAL FISTULA (N82.4) Story: Sophia Young is a very pleasant 31yoF with hx HTN, HLD, DM - here with clinically significant colovaginal  fistula; hx of L ureteral issues dating back at least 58 years; recently had double J stent in place Impression: -Referral to Dr. Gala Romney to update colonoscopy given hx of polyps, time since last scope and major procedure planned -The anatomy and physiology of the GI tract was discussed at length with the patient. The pathophysiology of diverticulitis and colovaginal fistulas was discussed with associated pictures. -We reviewed options going forward including further observation versus surgery. We discussed robotic-assisted sigmoidectomy/low anterior resection, takedown of colovaginal fistula, flexible sigmoidoscopy; cystoscopy/ureteral stents & ICG firefly by [facility] -Timing of procedure - plan to schedule after we obtain her colonoscopy. -The planned procedures, material risks (including, but not limited to, pain, bleeding, infection, scarring, need for blood transfusion, damage to surrounding structures- blood vessels/nerves/viscus/organs, damage to ureter, urine leak, leak from anastomosis, need for additional procedures, recurrence of colovaginal fistula, worsening of pre-existing medical conditions, need for stoma which could be permanent, hernia, recurrence, DVT/PE, pneumonia, heart attack, stroke, death) benefits and alternatives to surgery were discussed at length. We noted a good probability that the procedure would help improve their symptoms but are unlikely to resolve the chronic left ureteral issues she has experienced before. The patient's questions were answered to her satisfaction, she voiced understanding and elected to proceed with surgery. Additionally, we discussed typical postoperative expectations and the recovery process.  This patient encounter took 50 minutes today to perform the following: take history, perform exam, review outside records, interpret imaging, counsel the patient on their diagnosis and document encounter, findings & plan in the EHR  Signed by Ileana Roup, MD (02/12/2021 2:14 PM)

## 2021-04-02 NOTE — Progress Notes (Signed)
Pt. Needs orders for upcoming surgery.PAT and labs on 04/06/21.

## 2021-04-02 NOTE — Patient Instructions (Addendum)
DUE TO COVID-19 ONLY ONE VISITOR IS ALLOWED TO COME WITH YOU AND STAY IN THE WAITING ROOM ONLY DURING PRE OP AND PROCEDURE DAY OF SURGERY. THE 1 VISITOR  MAY VISIT WITH YOU AFTER SURGERY IN YOUR PRIVATE ROOM DURING VISITING HOURS ONLY!  YOU NEED TO HAVE A COVID 19 TEST ON: 04/13/21, THIS TEST MUST BE DONE BEFORE SURGERY, Location: [place]. Phone: [phone]. It's a drive-up,no appointment needed.From 8 AM -3 PM.               [place]   Your procedure is scheduled on: 04/15/21   Report to [facility] Main  Entrance   Report to admitting at : 10: 00 AM     Call this number if you have problems the morning of surgery [phone]    Remember: DRINK [address] AT  1000 PM AND 1 PRESURGERY DRINK THE DAY OF THE PROCEDURE 3 HOURS PRIOR TO SCHEDULED SURGERY. NO SOLIDS AFTER MIDNIGHT THE DAY PRIOR TO THE SURGERY. NOTHING BY MOUTH EXCEPT CLEAR LIQUIDS UNTIL THREE HOURS PRIOR TO SCHEDULED SURGERY. PLEASE FINISH PRESURGERY GATORADE DRINK PER SURGEON ORDER 3 HOURS PRIOR TO SCHEDULED SURGERY TIME WHICH NEEDS TO BE COMPLETED AT: 9:00 AM.   CLEAR LIQUID DIET  Foods Allowed                                                                     Foods Excluded  Coffee and tea, regular and decaf                             liquids that you cannot  Plain Jell-O any favor except red or purple                                           see through such as: Fruit ices (not with fruit pulp)                                     milk, soups, orange juice  Iced Popsicles                                    All solid food Carbonated beverages, regular and diet                                    Cranberry, grape and apple juices Sports drinks like Gatorade Lightly seasoned clear broth or consume(fat free) Sugar, honey syrup  Sample Menu Breakfast                                Lunch  Supper Cranberry juice                     Beef broth                            Chicken broth Jell-O                                     Grape juice                           Apple juice Coffee or tea                        Jell-O                                      Popsicle                                                Coffee or tea                        Coffee or tea  _____________________________________________________________________   BRUSH YOUR TEETH MORNING OF SURGERY AND RINSE YOUR MOUTH OUT, NO CHEWING GUM CANDY OR MINTS.    Take these medicines the morning of surgery with A SIP OF WATER: synthroid.  How to Manage Your Diabetes Before and After Surgery  Why is it important to control my blood sugar before and after surgery? Improving blood sugar levels before and after surgery helps healing and can limit problems. A way of improving blood sugar control is eating a healthy diet by:  Eating less sugar and carbohydrates  Increasing activity/exercise  Talking with your doctor about reaching your blood sugar goals High blood sugars (greater than 180 mg/dL) can raise your risk of infections and slow your recovery, so you will need to focus on controlling your diabetes during the weeks before surgery. Make sure that the doctor who takes care of your diabetes knows about your planned surgery including the date and location.  How do I manage my blood sugar before surgery? Check your blood sugar at least 4 times a day, starting 2 days before surgery, to make sure that the level is not too high or low. Check your blood sugar the morning of your surgery when you wake up and every 2 hours until you get to the Short Stay unit. If your blood sugar is less than 70 mg/dL, you will need to treat for low blood sugar: Do not take insulin. Treat a low blood sugar (less than 70 mg/dL) with  cup of clear juice (cranberry or apple), 4 glucose tablets, OR glucose gel. Recheck blood sugar in 15 minutes after treatment (to make sure  it is greater than 70 mg/dL). If your blood sugar is not greater than 70 mg/dL on recheck, call [phone] for further instructions. Report your blood sugar to the short stay nurse when you get to Short Stay.  If you are admitted to the hospital after surgery: Your blood sugar will be checked by the staff  and you will probably be given insulin after surgery (instead of oral diabetes medicines) to make sure you have good blood sugar levels. The goal for blood sugar control after surgery is 80-180 mg/dL.   WHAT DO I DO ABOUT MY DIABETES MEDICATION?  THE DAY BEFORE SURGERY, DO NOT take Xigduo.     THE MORNING OF SURGERY, DO NOT TAKE ANY ORAL DIABETIC MEDICATIONS DAY OF YOUR SURGERY                               You may not have any metal on your body including hair pins and              piercings  Do not wear jewelry, make-up, lotions, powders or perfumes, deodorant             Do not wear nail polish on your fingernails.  Do not shave  48 hours prior to surgery.    Do not bring valuables to the hospital. [place].  Contacts, dentures or bridgework may not be worn into surgery.  Leave suitcase in the car. After surgery it may be brought to your room.     Patients discharged the day of surgery will not be allowed to drive home. IF YOU ARE HAVING SURGERY AND GOING HOME THE SAME DAY, YOU MUST HAVE AN ADULT TO DRIVE YOU HOME AND BE WITH YOU FOR 24 HOURS. YOU MAY GO HOME BY TAXI OR UBER OR ORTHERWISE, BUT AN ADULT MUST ACCOMPANY YOU HOME AND STAY WITH YOU FOR 24 HOURS.  Name and phone number of your driver:  Special Instructions: N/A              Please read over the following fact sheets you were given: _____________________________________________________________________           [facility] - Preparing for Surgery Before surgery, you can play an important role.  Because skin is not sterile, your skin needs to be as free of germs as  possible.  You can reduce the number of germs on your skin by washing with CHG (chlorahexidine gluconate) soap before surgery.  CHG is an antiseptic cleaner which kills germs and bonds with the skin to continue killing germs even after washing. Please DO NOT use if you have an allergy to CHG or antibacterial soaps.  If your skin becomes reddened/irritated stop using the CHG and inform your nurse when you arrive at Short Stay. Do not shave (including legs and underarms) for at least 48 hours prior to the first CHG shower.  You may shave your face/neck. Please follow these instructions carefully:  1.  Shower with CHG Soap the night before surgery and the  morning of Surgery.  2.  If you choose to wash your hair, wash your hair first as usual with your  normal  shampoo.  3.  After you shampoo, rinse your hair and body thoroughly to remove the  shampoo.                           4.  Use CHG as you would any other liquid soap.  You can apply chg directly  to the skin and wash  Gently with a scrungie or clean washcloth.  5.  Apply the CHG Soap to your body ONLY FROM THE NECK DOWN.   Do not use on face/ open                           Wound or open sores. Avoid contact with eyes, ears mouth and genitals (private parts).                       Wash face,  Genitals (private parts) with your normal soap.             6.  Wash thoroughly, paying special attention to the area where your surgery  will be performed.  7.  Thoroughly rinse your body with warm water from the neck down.  8.  DO NOT shower/wash with your normal soap after using and rinsing off  the CHG Soap.                9.  Pat yourself dry with a clean towel.            10.  Wear clean pajamas.            11.  Place clean sheets on your bed the night of your first shower and do not  sleep with pets. Day of Surgery : Do not apply any lotions/deodorants the morning of surgery.  Please wear clean clothes to the hospital/surgery  center.  FAILURE TO FOLLOW THESE INSTRUCTIONS MAY RESULT IN THE CANCELLATION OF YOUR SURGERY PATIENT SIGNATURE_________________________________  NURSE SIGNATURE__________________________________  ________________________________________________________________________

## 2021-04-06 ENCOUNTER — Encounter (HOSPITAL_COMMUNITY): Payer: Self-pay

## 2021-04-06 ENCOUNTER — Other Ambulatory Visit: Payer: Self-pay

## 2021-04-06 ENCOUNTER — Encounter (HOSPITAL_COMMUNITY)
Admission: RE | Admit: 2021-04-06 | Discharge: 2021-04-06 | Disposition: A | Discharge status: Home / Self Care | Payer: BC Managed Care – PPO | Source: Ambulatory Visit | Attending: Surgery | Admitting: Surgery

## 2021-04-06 DIAGNOSIS — Z01812 Encounter for preprocedural laboratory examination: Secondary | ICD-10-CM | POA: Insufficient documentation

## 2021-04-06 HISTORY — DX: Personal history of urinary calculi: Z87.442

## 2021-04-06 LAB — CBC WITH DIFFERENTIAL/PLATELET
Abs Immature Granulocytes: 0.1 10*3/uL — ABNORMAL HIGH (ref 0.00–0.07)
Basophils Absolute: 0.1 10*3/uL (ref 0.0–0.1)
Basophils Relative: 1 %
Eosinophils Absolute: 0.1 10*3/uL (ref 0.0–0.5)
Eosinophils Relative: 1 %
HCT: 44.2 % (ref 36.0–46.0)
Hemoglobin: 15 g/dL (ref 12.0–15.0)
Immature Granulocytes: 1 %
Lymphocytes Relative: 22 %
Lymphs Abs: 1.9 10*3/uL (ref 0.7–4.0)
MCH: 30.8 pg (ref 26.0–34.0)
MCHC: 33.9 g/dL (ref 30.0–36.0)
MCV: 90.8 fL (ref 80.0–100.0)
Monocytes Absolute: 0.5 10*3/uL (ref 0.1–1.0)
Monocytes Relative: 6 %
Neutro Abs: 6 10*3/uL (ref 1.7–7.7)
Neutrophils Relative %: 69 %
Platelets: 243 10*3/uL (ref 150–400)
RBC: 4.87 MIL/uL (ref 3.87–5.11)
RDW: 14.5 % (ref 11.5–15.5)
WBC: 8.6 10*3/uL (ref 4.0–10.5)
nRBC: 0 % (ref 0.0–0.2)

## 2021-04-06 LAB — COMPREHENSIVE METABOLIC PANEL
ALT: 28 U/L (ref 0–44)
AST: 23 U/L (ref 15–41)
Albumin: 4.2 g/dL (ref 3.5–5.0)
Alkaline Phosphatase: 65 U/L (ref 38–126)
Anion gap: 11 (ref 5–15)
BUN: 21 mg/dL (ref 8–23)
CO2: 26 mmol/L (ref 22–32)
Calcium: 10.3 mg/dL (ref 8.9–10.3)
Chloride: 101 mmol/L (ref 98–111)
Creatinine, Ser: 0.65 mg/dL (ref 0.44–1.00)
GFR, Estimated: >60 mL/min (ref >60)
Glucose, Bld: 158 mg/dL — ABNORMAL HIGH (ref 70–99)
Potassium: 4.1 mmol/L (ref 3.5–5.1)
Sodium: 138 mmol/L (ref 135–145)
Total Bilirubin: 0.9 mg/dL (ref 0.3–1.2)
Total Protein: 8 g/dL (ref 6.5–8.1)

## 2021-04-06 LAB — PROTIME-INR
INR: 1 (ref 0.8–1.2)
Prothrombin Time: 12.6 seconds (ref 11.4–15.2)

## 2021-04-06 LAB — GLUCOSE, CAPILLARY: Glucose-Capillary: 179 mg/dL — ABNORMAL HIGH (ref 70–99)

## 2021-04-06 NOTE — Progress Notes (Signed)
COVID Vaccine Completed: Yes Date COVID Vaccine completed: 09/02/20 [place] COVID vaccine manufacturer:   Moderna    PCP - Dr. Allyn Kenner. Cardiologist -   Chest x-ray -  EKG -11/04/20: EPIC  Stress Test -  ECHO -  Cardiac Cath -  Pacemaker/ICD device last checked:  Sleep Study -  CPAP -   Fasting Blood Sugar - 100's Checks Blood Sugar ___1__ times a day  Blood Thinner Instructions: Aspirin Instructions: No instructions yet. Last Dose:  Anesthesia review:   Patient denies shortness of breath, fever, cough and chest pain at PAT appointment   Patient verbalized understanding of instructions that were given to them at the PAT appointment. Patient was also instructed that they will need to review over the PAT instructions again at home before surgery.

## 2021-04-07 LAB — HEMOGLOBIN A1C
Hgb A1c MFr Bld: 7.4 % — ABNORMAL HIGH (ref 4.8–5.6)
Mean Plasma Glucose: 166 mg/dL

## 2021-04-13 ENCOUNTER — Other Ambulatory Visit: Payer: Self-pay | Admitting: Surgery

## 2021-04-14 LAB — SARS CORONAVIRUS 2 (TAT 6-24 HRS): SARS Coronavirus 2: NEGATIVE

## 2021-04-14 MED ORDER — SODIUM CHLORIDE 0.9 % IV SOLN
2.0000 g | INTRAVENOUS | Status: DC
  Filled 2021-04-14: qty 2

## 2021-04-14 MED ORDER — BUPIVACAINE LIPOSOME 1.3 % IJ SUSP
20.0000 mL | Freq: Once | INTRAMUSCULAR | Status: DC
  Filled 2021-04-14: qty 20

## 2021-04-15 ENCOUNTER — Encounter (HOSPITAL_COMMUNITY)
Admission: RE | Disposition: A | Discharge status: Home / Self Care | Payer: Self-pay | Source: Home / Self Care | Attending: Surgery

## 2021-04-15 ENCOUNTER — Inpatient Hospital Stay (HOSPITAL_COMMUNITY)
Admission: RE | Admit: 2021-04-15 | Discharge: 2021-04-15 | Disposition: A | Discharge status: Home / Self Care | Payer: BC Managed Care – PPO | Attending: Surgery | Admitting: Surgery

## 2021-04-15 LAB — TYPE AND SCREEN
ABO/RH(D): A POS
Antibody Screen: NEGATIVE

## 2021-04-15 LAB — GLUCOSE, CAPILLARY: Glucose-Capillary: 224 mg/dL — ABNORMAL HIGH (ref 70–99)

## 2021-04-15 SURGERY — COLECTOMY, SIGMOID, ROBOT-ASSISTED
Anesthesia: General

## 2021-04-15 MED ORDER — CHLORHEXIDINE GLUCONATE 0.12 % MT SOLN: 15.0000 mL | Freq: Once | OROMUCOSAL | Status: DC

## 2021-04-15 MED ORDER — LACTATED RINGERS IV SOLN: INTRAVENOUS | Status: DC

## 2021-04-15 MED ORDER — ORAL CARE MOUTH RINSE: 15.0000 mL | Freq: Once | OROMUCOSAL | Status: DC

## 2021-04-15 MED ORDER — CHLORHEXIDINE GLUCONATE CLOTH 2 % EX PADS: 6.0000 | MEDICATED_PAD | Freq: Once | CUTANEOUS | Status: DC

## 2021-04-15 MED ORDER — HEPARIN SODIUM (PORCINE) 5000 UNIT/ML IJ SOLN
5000.0000 [IU] | Freq: Once | INTRAMUSCULAR | Status: DC
  Filled 2021-04-15: qty 1

## 2021-04-15 MED ORDER — ACETAMINOPHEN 500 MG PO TABS
1000.0000 mg | ORAL_TABLET | ORAL | Status: DC
  Filled 2021-04-15: qty 2

## 2021-04-15 MED ORDER — ENSURE PRE-SURGERY PO LIQD
296.0000 mL | Freq: Once | ORAL | Status: DC
  Filled 2021-04-15: qty 296

## 2021-04-15 MED ORDER — ENSURE PRE-SURGERY PO LIQD
592.0000 mL | Freq: Once | ORAL | Status: DC
  Filled 2021-04-15: qty 592

## 2021-04-15 MED ORDER — ALVIMOPAN 12 MG PO CAPS
12.0000 mg | ORAL_CAPSULE | ORAL | Status: DC
  Filled 2021-04-15: qty 1

## 2021-04-15 NOTE — Progress Notes (Signed)
Patient arrived for surgery. Upon questioning, she did not take proper bowel prep for her surgery today. Dr Dema Severin has cancelled surgery for today and reschedule it.

## 2021-04-15 NOTE — H&P (Signed)
Procedure for today is being rescheduled for another day for proper bowel prep. Spent time reviewing this with her, rational, and plans for rescheduling. She expressed understanding. We also reviewed process of the planned bowel prep as well  Nadeen Landau, MD [facility] Surgery Use [email or web address] to contact on call provider

## 2021-05-12 NOTE — Progress Notes (Signed)
Sent message, via epic in basket, requesting orders in epic from surgeon.  

## 2021-05-14 ENCOUNTER — Ambulatory Visit: Payer: Self-pay | Admitting: Surgery

## 2021-05-21 ENCOUNTER — Other Ambulatory Visit: Payer: Self-pay | Admitting: Urology

## 2021-05-21 NOTE — Patient Instructions (Addendum)
DUE TO COVID-19 ONLY ONE VISITOR IS ALLOWED TO COME WITH YOU AND STAY IN THE WAITING ROOM ONLY DURING PRE OP AND PROCEDURE.   **NO VISITORS ARE ALLOWED IN THE SHORT STAY AREA OR RECOVERY ROOM!!**  IF YOU WILL BE ADMITTED INTO THE HOSPITAL YOU ARE ALLOWED ONLY TWO SUPPORT PEOPLE DURING VISITATION HOURS ONLY (10AM -8PM)   The support person(s) may change daily. The support person(s) must pass our screening, gel in and out, and wear a mask at all times, including in the patient's room. Patients must also wear a mask when staff or their support person are in the room.  No visitors under the age of 1. Any visitor under the age of 38 must be accompanied by an adult.   COVID SWAB TESTING MUST BE COMPLETED ON:  Monday, 06-01-21 Between the hours of 8 and 3  **MUST PRESENT COMPLETED FORM AT TESTING SITE**    [facility] [place] (backside of the building)  You are not required to quarantine, however you are required to wear a well-fitted mask when you are out and around people not in your household.  Hand Hygiene often Do NOT share personal items Notify your provider if you are in close contact with someone who has COVID or you develop fever 100.4 or greater, new onset of sneezing, cough, sore throat, shortness of breath or body aches.        Your procedure is scheduled on:   Wednesday, 06-03-21   Report to [facility] Main  Entrance    Report to admitting at 9:45 AM   Call this number if you have problems the morning of surgery [phone]   Follow prep instructions from surgeon's office   Follow a clear liquid diet day of prep to prevent dehydration   Do not eat food :After Midnight.   May have liquids until 9:00 AM day of surgery  CLEAR LIQUID DIET  Foods Allowed                                                                     Foods Excluded  Water, Black Coffee (no milk/no creamer) and tea, regular and decaf                              liquids that  you cannot  Plain Jell-O in any flavor  (No red)                         see through such as: Fruit ices (not with fruit pulp)                                 milk, soups, orange juice  Iced Popsicles (No red)                                    All solid food  Apple juices Sports drinks like Gatorade (No red) Lightly seasoned clear broth or consume(fat free) Sugar Sample Menu Breakfast                                Lunch                                     Supper Cranberry juice                    Beef broth                            Chicken broth Jell-O                                     Grape juice                           Apple juice Coffee or tea                        Jell-O                                      Popsicle                                                Coffee or tea                        Coffee or tea      Drink 2 G2 drinks the night before surgery.    Complete one G2 drink the morning of surgery 3 hours prior to scheduled surgery at 9:00 AM.     The day of surgery:  Drink ONE (1)  G2 the morning of surgery. Drink in one sitting. Do not sip.  This drink was given to you during your hospital  pre-op appointment visit. Nothing else to drink after completing the G2.          If you have questions, please contact your surgeon's office.     Oral Hygiene is also important to reduce your risk of infection.                                    Remember - BRUSH YOUR TEETH THE MORNING OF SURGERY WITH YOUR REGULAR TOOTHPASTE   Do NOT smoke after Midnight   Take these medicines the morning of surgery with A SIP OF WATER:   Levothyroxine, Atorvastatin                  Aspirin 81 mg - stop one week prior to surgery   Stop all vitamins and herbal supplements a week before surgery   How to Manage Your Diabetes Before and After Surgery  Why is it important to control my blood sugar before and after surgery? Improving blood  sugar levels  before and after surgery helps healing and can limit problems. A way of improving blood sugar control is eating a healthy diet by:  Eating less sugar and carbohydrates  Increasing activity/exercise  Talking with your doctor about reaching your blood sugar goals High blood sugars (greater than 180 mg/dL) can raise your risk of infections and slow your recovery, so you will need to focus on controlling your diabetes during the weeks before surgery. Make sure that the doctor who takes care of your diabetes knows about your planned surgery including the date and location.  How do I manage my blood sugar before surgery? Check your blood sugar at least 4 times a day, starting 2 days before surgery, to make sure that the level is not too high or low. Check your blood sugar the morning of your surgery when you wake up and every 2 hours until you get to the Short Stay unit. If your blood sugar is less than 70 mg/dL, you will need to treat for low blood sugar: Do not take insulin. Treat a low blood sugar (less than 70 mg/dL) with  cup of clear juice (cranberry or apple), 4 glucose tablets, OR glucose gel. Recheck blood sugar in 15 minutes after treatment (to make sure it is greater than 70 mg/dL). If your blood sugar is not greater than 70 mg/dL on recheck, call [phone] for further instructions. Report your blood sugar to the short stay nurse when you get to Short Stay.  If you are admitted to the hospital after surgery: Your blood sugar will be checked by the staff and you will probably be given insulin after surgery (instead of oral diabetes medicines) to make sure you have good blood sugar levels. The goal for blood sugar control after surgery is 80-180 mg/dL.   WHAT DO I DO ABOUT MY DIABETES MEDICATION?  Do not take oral diabetes medicines (pills) the morning of surgery.  THE DAY BEFORE SURGERY:  Do not take Xigduo.       THE MORNING OF SURGERY:  Do not take Xigduo.   Reviewed and  Endorsed by [facility] Patient Education Committee, August 2015               You may not have any metal on your body including hair pins, jewelry, and body piercing             Do not wear make-up, lotions, powders, perfumes or deodorant  Do not wear nail polish including gel and S&S, artificial/acrylic nails, or any other type of covering on natural nails including finger and toenails. If you have artificial nails, gel coating, etc. that needs to be removed by a nail salon please have this removed prior to surgery or surgery may need to be canceled/ delayed if the surgeon/ anesthesia feels like they are unable to be safely monitored.   Do not shave  48 hours prior to surgery.        Do not bring valuables to the hospital. [place].   Contacts, dentures or bridgework may not be worn into surgery.   Bring small overnight bag day of surgery.    Please read over the following fact sheets you were given: IF YOU HAVE QUESTIONS ABOUT YOUR PRE OP INSTRUCTIONS PLEASE CALL [place] - Preparing for Surgery Before surgery, you can play an important role.  Because skin is not sterile, your skin needs to be as free of  germs as possible.  You can reduce the number of germs on your skin by washing with CHG (chlorahexidine gluconate) soap before surgery.  CHG is an antiseptic cleaner which kills germs and bonds with the skin to continue killing germs even after washing. Please DO NOT use if you have an allergy to CHG or antibacterial soaps.  If your skin becomes reddened/irritated stop using the CHG and inform your nurse when you arrive at Short Stay. Do not shave (including legs and underarms) for at least 48 hours prior to the first CHG shower.  You may shave your face/neck.  Please follow these instructions carefully:  1.  Shower with CHG Soap the night before surgery and the  morning of surgery.  2.  If you choose to wash your hair, wash  your hair first as usual with your normal  shampoo.  3.  After you shampoo, rinse your hair and body thoroughly to remove the shampoo.                             4.  Use CHG as you would any other liquid soap.  You can apply chg directly to the skin and wash.  Gently with a scrungie or clean washcloth.  5.  Apply the CHG Soap to your body ONLY FROM THE NECK DOWN.   Do   not use on face/ open                           Wound or open sores. Avoid contact with eyes, ears mouth and   genitals (private parts).                       Wash face,  Genitals (private parts) with your normal soap.             6.  Wash thoroughly, paying special attention to the area where your    surgery  will be performed.  7.  Thoroughly rinse your body with warm water from the neck down.  8.  DO NOT shower/wash with your normal soap after using and rinsing off the CHG Soap.                9.  Pat yourself dry with a clean towel.            10.  Wear clean pajamas.            11.  Place clean sheets on your bed the night of your first shower and do not  sleep with pets. Day of Surgery : Do not apply any lotions/deodorants the morning of surgery.  Please wear clean clothes to the hospital/surgery center.  FAILURE TO FOLLOW THESE INSTRUCTIONS MAY RESULT IN THE CANCELLATION OF YOUR SURGERY  PATIENT SIGNATURE_________________________________  NURSE SIGNATURE__________________________________  ________________________________________________________________________   Adam Phenix  An incentive spirometer is a tool that can help keep your lungs clear and active. This tool measures how well you are filling your lungs with each breath. Taking long deep breaths may help reverse or decrease the chance of developing breathing (pulmonary) problems (especially infection) following: A long period of time when you are unable to move or be active. BEFORE THE PROCEDURE  If the spirometer includes an indicator to show your  best effort, your nurse or respiratory therapist will set it to a desired goal. If possible, sit up straight or  lean slightly forward. Try not to slouch. Hold the incentive spirometer in an upright position. INSTRUCTIONS FOR USE  Sit on the edge of your bed if possible, or sit up as far as you can in bed or on a chair. Hold the incentive spirometer in an upright position. Breathe out normally. Place the mouthpiece in your mouth and seal your lips tightly around it. Breathe in slowly and as deeply as possible, raising the piston or the ball toward the top of the column. Hold your breath for 3-5 seconds or for as long as possible. Allow the piston or ball to fall to the bottom of the column. Remove the mouthpiece from your mouth and breathe out normally. Rest for a few seconds and repeat Steps 1 through 7 at least 10 times every 1-2 hours when you are awake. Take your time and take a few normal breaths between deep breaths. The spirometer may include an indicator to show your best effort. Use the indicator as a goal to work toward during each repetition. After each set of 10 deep breaths, practice coughing to be sure your lungs are clear. If you have an incision (the cut made at the time of surgery), support your incision when coughing by placing a pillow or rolled up towels firmly against it. Once you are able to get out of bed, walk around indoors and cough well. You may stop using the incentive spirometer when instructed by your caregiver.  RISKS AND COMPLICATIONS Take your time so you do not get dizzy or light-headed. If you are in pain, you may need to take or ask for pain medication before doing incentive spirometry. It is harder to take a deep breath if you are having pain. AFTER USE Rest and breathe slowly and easily. It can be helpful to keep track of a log of your progress. Your caregiver can provide you with a simple table to help with this. If you are using the spirometer at home,  follow these instructions: [place] IF:  You are having difficultly using the spirometer. You have trouble using the spirometer as often as instructed. Your pain medication is not giving enough relief while using the spirometer. You develop fever of 100.5 F (38.1 C) or higher. SEEK IMMEDIATE MEDICAL CARE IF:  You cough up bloody sputum that had not been present before. You develop fever of 102 F (38.9 C) or greater. You develop worsening pain at or near the incision site. MAKE SURE YOU:  Understand these instructions. Will watch your condition. Will get help right away if you are not doing well or get worse. Document Released: 01/10/2007 Document Revised: 11/22/2011 Document Reviewed: 03/13/2007 Exit[employer] Patient Information 2014 [employer], [place].   ________________________________________________________________________  WHAT IS A BLOOD TRANSFUSION? Blood Transfusion Information  A transfusion is the replacement of blood or some of its parts. Blood is made up of multiple cells which provide different functions. Red blood cells carry oxygen and are used for blood loss replacement. White blood cells fight against infection. Platelets control bleeding. Plasma helps clot blood. Other blood products are available for specialized needs, such as hemophilia or other clotting disorders. BEFORE THE TRANSFUSION  Who gives blood for transfusions?  Healthy volunteers who are fully evaluated to make sure their blood is safe. This is blood bank blood. Transfusion therapy is the safest it has ever been in the practice of medicine. Before blood is taken from a donor, a complete history is taken to make sure that person has  no history of diseases nor engages in risky social behavior (examples are intravenous drug use or sexual activity with multiple partners). The donor's travel history is screened to minimize risk of transmitting infections, such as malaria. The donated blood is tested for  signs of infectious diseases, such as HIV and hepatitis. The blood is then tested to be sure it is compatible with you in order to minimize the chance of a transfusion reaction. If you or a relative donates blood, this is often done in anticipation of surgery and is not appropriate for emergency situations. It takes many days to process the donated blood. RISKS AND COMPLICATIONS Although transfusion therapy is very safe and saves many lives, the main dangers of transfusion include:  Getting an infectious disease. Developing a transfusion reaction. This is an allergic reaction to something in the blood you were given. Every precaution is taken to prevent this. The decision to have a blood transfusion has been considered carefully by your caregiver before blood is given. Blood is not given unless the benefits outweigh the risks. AFTER THE TRANSFUSION Right after receiving a blood transfusion, you will usually feel much better and more energetic. This is especially true if your red blood cells have gotten low (anemic). The transfusion raises the level of the red blood cells which carry oxygen, and this usually causes an energy increase. The nurse administering the transfusion will monitor you carefully for complications. HOME CARE INSTRUCTIONS  No special instructions are needed after a transfusion. You may find your energy is better. Speak with your caregiver about any limitations on activity for underlying diseases you may have. SEEK MEDICAL CARE IF:  Your condition is not improving after your transfusion. You develop redness or irritation at the intravenous (IV) site. SEEK IMMEDIATE MEDICAL CARE IF:  Any of the following symptoms occur over the next 12 hours: Shaking chills. You have a temperature by mouth above 102 F (38.9 C), not controlled by medicine. Chest, back, or muscle pain. People around you feel you are not acting correctly or are confused. Shortness of breath or difficulty  breathing. Dizziness and fainting. You get a rash or develop hives. You have a decrease in urine output. Your urine turns a dark color or changes to pink, red, or brown. Any of the following symptoms occur over the next 10 days: You have a temperature by mouth above 102 F (38.9 C), not controlled by medicine. Shortness of breath. Weakness after normal activity. The white part of the eye turns yellow (jaundice). You have a decrease in the amount of urine or are urinating less often. Your urine turns a dark color or changes to pink, red, or brown. Document Released: 08/27/2000 Document Revised: 11/22/2011 Document Reviewed: 04/15/2008 [facility] Patient Information 2014 [place], [place].  _______________________________________________________________________

## 2021-05-21 NOTE — Progress Notes (Addendum)
COVID swab appointment:  06-01-21  COVID Vaccine Completed:  Yes x3 Date COVID Vaccine completed: Has received booster: COVID vaccine manufacturer:   Moderna    Date of COVID positive in last 90 days:  No  PCP - Wende Neighbors, MD.  Office note requested Cardiologist - N/A  Chest x-ray - N/A EKG - 11-04-20 Epic Stress Test - N/A ECHO - N/A Cardiac Cath - N/A Pacemaker/ICD device last checked: Spinal Cord Stimulator:  Sleep Study - N/A CPAP -   Fasting Blood Sugar - 50 to 90s Checks Blood Sugar - twice a week  Blood Thinner Instructions: Aspirin Instructions:  ASA 81 mg.  Patient will stop a week prior to surgery Last Dose:  Activity level:  Can go up a flight of stairs and perform activities of daily living without stopping and without symptoms of chest pain or shortness of breath.  Able to exercise without symptoms, patient walks daily.  Anesthesia review:  BP elevated at PAT 183/76 and 190/72 on recheck.  Patient stated that it can run high when at doctor's offices.  Denies headache, chest pain, dizziness at PAT.  Patient will monitor at home and report to PCP if it remains elevated.  Patient denies shortness of breath, fever, cough and chest pain at PAT appointment   Patient verbalized understanding of instructions that were given to them at the PAT appointment. Patient was also instructed that they will need to review over the PAT instructions again at home before surgery.

## 2021-05-25 ENCOUNTER — Other Ambulatory Visit: Payer: Self-pay

## 2021-05-25 ENCOUNTER — Encounter (HOSPITAL_COMMUNITY)
Admit: 2021-05-25 | Discharge: 2021-05-25 | Disposition: A | Discharge status: Home / Self Care | Payer: Medicare Other | Attending: Surgery | Admitting: Surgery

## 2021-05-25 ENCOUNTER — Encounter (HOSPITAL_COMMUNITY): Payer: Self-pay

## 2021-05-25 DIAGNOSIS — R7989 Other specified abnormal findings of blood chemistry: Secondary | ICD-10-CM | POA: Insufficient documentation

## 2021-05-25 DIAGNOSIS — Z01812 Encounter for preprocedural laboratory examination: Secondary | ICD-10-CM | POA: Insufficient documentation

## 2021-05-25 LAB — CBC
HCT: 44.2 % (ref 36.0–46.0)
Hemoglobin: 14.7 g/dL (ref 12.0–15.0)
MCH: 30.4 pg (ref 26.0–34.0)
MCHC: 33.3 g/dL (ref 30.0–36.0)
MCV: 91.3 fL (ref 80.0–100.0)
Platelets: 238 10*3/uL (ref 150–400)
RBC: 4.84 MIL/uL (ref 3.87–5.11)
RDW: 14 % (ref 11.5–15.5)
WBC: 6.9 10*3/uL (ref 4.0–10.5)
nRBC: 0 % (ref 0.0–0.2)

## 2021-05-25 LAB — BASIC METABOLIC PANEL
Anion gap: 10 (ref 5–15)
BUN: 15 mg/dL (ref 8–23)
CO2: 28 mmol/L (ref 22–32)
Calcium: 9.9 mg/dL (ref 8.9–10.3)
Chloride: 105 mmol/L (ref 98–111)
Creatinine, Ser: 0.71 mg/dL (ref 0.44–1.00)
GFR, Estimated: >60 mL/min (ref >60)
Glucose, Bld: 138 mg/dL — ABNORMAL HIGH (ref 70–99)
Potassium: 4.1 mmol/L (ref 3.5–5.1)
Sodium: 143 mmol/L (ref 135–145)

## 2021-05-25 LAB — GLUCOSE, CAPILLARY: Glucose-Capillary: 140 mg/dL — ABNORMAL HIGH (ref 70–99)

## 2021-05-29 ENCOUNTER — Other Ambulatory Visit: Payer: Self-pay

## 2021-05-29 ENCOUNTER — Ambulatory Visit (HOSPITAL_COMMUNITY)
Admission: RE | Admit: 2021-05-29 | Discharge: 2021-05-29 | Disposition: A | Discharge status: Home / Self Care | Payer: Medicare Other | Source: Ambulatory Visit | Attending: Urology | Admitting: Urology

## 2021-05-29 DIAGNOSIS — Q6239 Other obstructive defects of renal pelvis and ureter: Secondary | ICD-10-CM | POA: Insufficient documentation

## 2021-05-29 DIAGNOSIS — N1330 Unspecified hydronephrosis: Secondary | ICD-10-CM | POA: Diagnosis not present

## 2021-06-01 ENCOUNTER — Other Ambulatory Visit: Payer: Self-pay | Admitting: Surgery

## 2021-06-02 ENCOUNTER — Encounter (HOSPITAL_COMMUNITY): Payer: Self-pay | Admitting: Surgery

## 2021-06-02 LAB — SARS CORONAVIRUS 2 (TAT 6-24 HRS): SARS Coronavirus 2: NEGATIVE

## 2021-06-02 NOTE — H&P (Signed)
Office Visit Report     01/02/2021   --------------------------------------------------------------------------------   Sophia Young. Sophia Young  MRN: [id number]  DOB: Feb 07, 1956, 65 year old Female  SSN:    PRIMARY CARE:  Remus Blake II  REFERRING:  Cleon Gustin, MD  PROVIDER:  Bjorn Loser, M.D.  LOCATION:  [facility] [phone]     --------------------------------------------------------------------------------   CC/HPI: I was consulted to assess the patient for possible bowel vaginal fistula. A few years ago she had diverticulitis and a colonoscopy. She has had a hysterectomy. Sometimes when she urinates or has a bowel movement she can have stool in a vagina. She thinks generally the urine is clear but sometimes she wipes and there is a brownish fluid. This started about 6 months ago was some blood in the vagina.   She voids every 2-3 hours gets up once a night. Reports a good flow   She is continent. She has had a stent and recent treatment for kidney stone. She had a recent bladder infection.   She is on oral hypoglycemics. She has had low back surgery.       ALLERGIES: None   MEDICATIONS: Aspirin 81 mg tablet,chewable  Glipizide Er 5 mg tablet, extended release 24 hr  Levothyroxine 75 mcg capsule  Losartan Potassium 25 mg tablet  Pravastatin Sodium 40 mg tablet  Triamterene-Hydrochlorothiazid 37.5 mg-25 mg capsule  Vitamin D3  Xigduo Xr     GU PSH: Hysterectomy     NON-GU PSH: Back surgery Cholecystectomy (laparoscopic)     GU PMH: None   NON-GU PMH: Diabetes Type 2 Hypercholesterolemia Hypertension    FAMILY HISTORY: 1 son - Runs in Family Heart Disease - Father lung disease - Mother   SOCIAL HISTORY: Marital Status: Widowed Race: White Current Smoking Status: Patient does not smoke anymore. Has not smoked since 12/12/1984. Smoked for 3 years.   Tobacco Use Assessment Completed: Used Tobacco in last 30 days? Does not drink  anymore.  Drinks 1 caffeinated drink per day.    REVIEW OF SYSTEMS:    GU Review Female:   Patient reports get up at night to urinate. Patient denies frequent urination, hard to postpone urination, burning /pain with urination, leakage of urine, stream starts and stops, trouble starting your stream, have to strain to urinate, and being pregnant.  Gastrointestinal (Upper):   Patient denies nausea, vomiting, and indigestion/ heartburn.  Gastrointestinal (Lower):   Patient denies constipation and diarrhea.  Constitutional:   Patient denies fever, night sweats, weight loss, and fatigue.  Skin:   Patient denies skin rash/ lesion and itching.  Eyes:   Patient denies blurred vision and double vision.  Ears/ Nose/ Throat:   Patient denies sore throat and sinus problems.  Hematologic/Lymphatic:   Patient denies swollen glands and easy bruising.  Cardiovascular:   Patient denies leg swelling and chest pains.  Respiratory:   Patient denies cough and shortness of breath.  Endocrine:   Patient denies excessive thirst.  Musculoskeletal:   Patient denies back pain and joint pain.  Neurological:   Patient denies headaches and dizziness.  Psychologic:   Patient denies depression and anxiety.   VITAL SIGNS:      01/02/2021 03:09 PM  Weight 210 lb / 95.25 kg  Height 63 in / 160.02 cm  BP 157/78 mmHg  Pulse 73 /min  Temperature 97.7 F / 36.5 C  BMI 37.2 kg/m   GU PHYSICAL EXAMINATION:    Vagina: On pelvic examination exam was little  bit limited due to her vaginally thin obesity. She had soft stool within the vaginal vault. It was difficult to say whether not it was starting at the apex but likely was   MULTI-SYSTEM PHYSICAL EXAMINATION:    Constitutional: Well-nourished. No physical deformities. Normally developed. Good grooming.  Neck: Neck symmetrical, not swollen. Normal tracheal position.  Respiratory: No labored breathing, no use of accessory muscles.   Cardiovascular: Normal temperature,  normal extremity pulses, no swelling, no varicosities.  Lymphatic: No enlargement of neck, axillae, groin.  Skin: No paleness, no jaundice, no cyanosis. No lesion, no ulcer, no rash.  Neurologic / Psychiatric: Oriented to time, oriented to place, oriented to person. No depression, no anxiety, no agitation.  Gastrointestinal: No mass, no tenderness, no rigidity, non obese abdomen.  Eyes: Normal conjunctivae. Normal eyelids.  Ears, Nose, Mouth, and Throat: Left ear no scars, no lesions, no masses. Right ear no scars, no lesions, no masses. Nose no scars, no lesions, no masses. Normal hearing. Normal lips.  Musculoskeletal: Normal gait and station of head and neck.     PAST DATA REVIEW: None   PROCEDURES:          Urinalysis w/Scope Dipstick Dipstick Cont'd Micro  Color: Yellow Bilirubin: Neg mg/dL WBC/hpf: 0 - 5/hpf  Appearance: Clear Ketones: Neg mg/dL RBC/hpf: NS (Not Seen)  Specific Gravity: 1.020 Blood: Neg ery/uL Bacteria: Many (>50/hpf)  pH: 6.0 Protein: Neg mg/dL Cystals: NS (Not Seen)  Glucose: 2+ mg/dL Urobilinogen: 0.2 mg/dL Casts: NS (Not Seen)    Nitrites: Neg Trichomonas: Not Present    Leukocyte Esterase: 3+ leu/uL Mucous: Not Present      Epithelial Cells: 0 - 5/hpf      Yeast: Few (1 - 5/hpf)      Sperm: Not Present    ASSESSMENT:      ICD-10 Details  1 GU:   Urinary Tract Inf, Unspec site - N39.0   2   Urinary Frequency - R35.0           Notes:   I was consulted to assess the patient for possible bowel vaginal fistula. A few years ago she had diverticulitis and a colonoscopy. She has had a hysterectomy. Sometimes when she urinates or has a bowel movement she can have stool in a vagina. She thinks generally the urine is clear but sometimes she wipes and there is a brownish fluid. This started about 6 months ago was some blood in the vagina.   She voids every 2-3 hours gets up once a night. Reports a good flow   She is continent. She has had a stent and recent  treatment for kidney stone. She had a recent bladder infection.   on review of the medical records she is known to have a left ureteropelvic junction obstruction. She has had a stent. She has residual hydronephrosis. She had a CT scan recently the abdomen pelvis and there was no bowel bladder fistula. She had a negative cystoscopy recently by Dr. Alyson Ingles   On pelvic examination exam was little bit limited due to her vaginally thin obesity. She had soft stool within the vaginal vault. It was difficult to say whether not it was starting at the apex but likely was. picture was drawn. Patient understands she likely has fistula from her bowel to the vagina. Risk factor was previous diverticulitis. I will consult General surgery as well as Urogynecology and leave it to them which x-rayed order. she may need a CT scan with oral contrast. She  may need a colonoscopy.

## 2021-06-03 ENCOUNTER — Encounter (HOSPITAL_COMMUNITY): Payer: Self-pay | Admitting: Surgery

## 2021-06-03 ENCOUNTER — Inpatient Hospital Stay (HOSPITAL_COMMUNITY): Payer: BC Managed Care – PPO

## 2021-06-03 ENCOUNTER — Other Ambulatory Visit: Payer: Self-pay

## 2021-06-03 ENCOUNTER — Inpatient Hospital Stay (HOSPITAL_COMMUNITY)
Admission: RE | Admit: 2021-06-03 | Discharge: 2021-06-07 | DRG: 330 | Disposition: A | Discharge status: Home / Self Care | Payer: BC Managed Care – PPO | Attending: General Surgery | Admitting: General Surgery

## 2021-06-03 ENCOUNTER — Encounter (HOSPITAL_COMMUNITY)
Admission: RE | Disposition: A | Discharge status: Home / Self Care | Payer: Self-pay | Source: Home / Self Care | Attending: Surgery

## 2021-06-03 ENCOUNTER — Inpatient Hospital Stay (HOSPITAL_COMMUNITY): Payer: BC Managed Care – PPO | Admitting: Anesthesiology

## 2021-06-03 ENCOUNTER — Inpatient Hospital Stay (HOSPITAL_COMMUNITY): Payer: BC Managed Care – PPO | Admitting: Physician Assistant

## 2021-06-03 DIAGNOSIS — Z87440 Personal history of urinary (tract) infections: Secondary | ICD-10-CM | POA: Diagnosis not present

## 2021-06-03 DIAGNOSIS — N823 Fistula of vagina to large intestine: Secondary | ICD-10-CM | POA: Diagnosis not present

## 2021-06-03 DIAGNOSIS — Z8042 Family history of malignant neoplasm of prostate: Secondary | ICD-10-CM

## 2021-06-03 DIAGNOSIS — Z6837 Body mass index (BMI) 37.0-37.9, adult: Secondary | ICD-10-CM

## 2021-06-03 DIAGNOSIS — Z86010 Personal history of colonic polyps: Secondary | ICD-10-CM

## 2021-06-03 DIAGNOSIS — Z90710 Acquired absence of both cervix and uterus: Secondary | ICD-10-CM

## 2021-06-03 DIAGNOSIS — E7800 Pure hypercholesterolemia, unspecified: Secondary | ICD-10-CM | POA: Diagnosis present

## 2021-06-03 DIAGNOSIS — K5732 Diverticulitis of large intestine without perforation or abscess without bleeding: Secondary | ICD-10-CM | POA: Diagnosis not present

## 2021-06-03 DIAGNOSIS — Z79899 Other long term (current) drug therapy: Secondary | ICD-10-CM | POA: Diagnosis not present

## 2021-06-03 DIAGNOSIS — Z87891 Personal history of nicotine dependence: Secondary | ICD-10-CM | POA: Diagnosis not present

## 2021-06-03 DIAGNOSIS — E119 Type 2 diabetes mellitus without complications: Secondary | ICD-10-CM | POA: Diagnosis present

## 2021-06-03 DIAGNOSIS — I10 Essential (primary) hypertension: Secondary | ICD-10-CM | POA: Diagnosis not present

## 2021-06-03 DIAGNOSIS — G8929 Other chronic pain: Secondary | ICD-10-CM | POA: Diagnosis present

## 2021-06-03 DIAGNOSIS — N822 Fistula of vagina to small intestine: Secondary | ICD-10-CM | POA: Diagnosis not present

## 2021-06-03 DIAGNOSIS — N131 Hydronephrosis with ureteral stricture, not elsewhere classified: Secondary | ICD-10-CM | POA: Diagnosis not present

## 2021-06-03 DIAGNOSIS — N828 Other female genital tract fistulae: Secondary | ICD-10-CM | POA: Diagnosis present

## 2021-06-03 DIAGNOSIS — E669 Obesity, unspecified: Secondary | ICD-10-CM | POA: Diagnosis present

## 2021-06-03 DIAGNOSIS — Z87442 Personal history of urinary calculi: Secondary | ICD-10-CM | POA: Diagnosis not present

## 2021-06-03 DIAGNOSIS — N321 Vesicointestinal fistula: Secondary | ICD-10-CM | POA: Diagnosis not present

## 2021-06-03 DIAGNOSIS — K420 Umbilical hernia with obstruction, without gangrene: Secondary | ICD-10-CM | POA: Diagnosis not present

## 2021-06-03 DIAGNOSIS — Z7982 Long term (current) use of aspirin: Secondary | ICD-10-CM | POA: Diagnosis not present

## 2021-06-03 DIAGNOSIS — K660 Peritoneal adhesions (postprocedural) (postinfection): Secondary | ICD-10-CM | POA: Diagnosis not present

## 2021-06-03 DIAGNOSIS — N736 Female pelvic peritoneal adhesions (postinfective): Secondary | ICD-10-CM | POA: Diagnosis not present

## 2021-06-03 DIAGNOSIS — K429 Umbilical hernia without obstruction or gangrene: Secondary | ICD-10-CM | POA: Diagnosis present

## 2021-06-03 DIAGNOSIS — N1330 Unspecified hydronephrosis: Secondary | ICD-10-CM | POA: Diagnosis not present

## 2021-06-03 DIAGNOSIS — Z9049 Acquired absence of other specified parts of digestive tract: Secondary | ICD-10-CM

## 2021-06-03 HISTORY — PX: FLEXIBLE SIGMOIDOSCOPY: SHX5431

## 2021-06-03 LAB — TYPE AND SCREEN
ABO/RH(D): A POS
Antibody Screen: NEGATIVE

## 2021-06-03 LAB — GLUCOSE, CAPILLARY
Glucose-Capillary: 233 mg/dL — ABNORMAL HIGH (ref 70–99)
Glucose-Capillary: 233 mg/dL — ABNORMAL HIGH (ref 70–99)

## 2021-06-03 SURGERY — COLECTOMY, SIGMOID, ROBOT-ASSISTED
Anesthesia: General | Site: Abdomen

## 2021-06-03 MED ORDER — PROPOFOL 10 MG/ML IV BOLUS
INTRAVENOUS | Status: AC
  Filled 2021-06-03: qty 20

## 2021-06-03 MED ORDER — ALVIMOPAN 12 MG PO CAPS
12.0000 mg | ORAL_CAPSULE | ORAL | Status: AC
  Administered 2021-06-03: 12 mg via ORAL
  Filled 2021-06-03: qty 1

## 2021-06-03 MED ORDER — LOSARTAN POTASSIUM 50 MG PO TABS
50.0000 mg | ORAL_TABLET | Freq: Every day | ORAL | Status: DC
  Administered 2021-06-04 – 2021-06-07 (×4): 50 mg via ORAL
  Filled 2021-06-03 (×4): qty 1

## 2021-06-03 MED ORDER — CHLORHEXIDINE GLUCONATE CLOTH 2 % EX PADS
6.0000 | MEDICATED_PAD | Freq: Once | CUTANEOUS | Status: DC
Start: 2021-06-03 — End: 2021-06-03

## 2021-06-03 MED ORDER — FENTANYL CITRATE (PF) 250 MCG/5ML IJ SOLN
INTRAMUSCULAR | Status: AC
  Filled 2021-06-03: qty 5

## 2021-06-03 MED ORDER — POLYETHYLENE GLYCOL 3350 17 GM/SCOOP PO POWD: 1.0000 | Freq: Once | ORAL | Status: DC

## 2021-06-03 MED ORDER — LACTATED RINGERS IV SOLN: INTRAVENOUS | Status: DC

## 2021-06-03 MED ORDER — HYDRALAZINE HCL 20 MG/ML IJ SOLN: 10.0000 mg | INTRAMUSCULAR | Status: DC | PRN

## 2021-06-03 MED ORDER — DIPHENHYDRAMINE HCL 50 MG/ML IJ SOLN: 12.5000 mg | Freq: Four times a day (QID) | INTRAMUSCULAR | Status: DC | PRN

## 2021-06-03 MED ORDER — SODIUM CHLORIDE 0.9 % IR SOLN
Status: DC | PRN
  Administered 2021-06-03: 1000 mL via INTRAVESICAL

## 2021-06-03 MED ORDER — BUPIVACAINE LIPOSOME 1.3 % IJ SUSP
INTRAMUSCULAR | Status: AC
  Filled 2021-06-03: qty 20

## 2021-06-03 MED ORDER — MIDAZOLAM HCL 5 MG/5ML IJ SOLN
INTRAMUSCULAR | Status: DC | PRN
  Administered 2021-06-03: 2 mg via INTRAVENOUS

## 2021-06-03 MED ORDER — IOHEXOL 300 MG/ML  SOLN
INTRAMUSCULAR | Status: DC | PRN
  Administered 2021-06-03: 15 mL

## 2021-06-03 MED ORDER — ALUM & MAG HYDROXIDE-SIMETH 200-200-20 MG/5ML PO SUSP: 30.0000 mL | Freq: Four times a day (QID) | ORAL | Status: DC | PRN

## 2021-06-03 MED ORDER — BUPIVACAINE-EPINEPHRINE (PF) 0.25% -1:200000 IJ SOLN
INTRAMUSCULAR | Status: AC
  Filled 2021-06-03: qty 30

## 2021-06-03 MED ORDER — BUPIVACAINE-EPINEPHRINE (PF) 0.25% -1:200000 IJ SOLN
INTRAMUSCULAR | Status: DC | PRN
  Administered 2021-06-03: 30 mL via PERINEURAL

## 2021-06-03 MED ORDER — ENSURE PRE-SURGERY PO LIQD
592.0000 mL | Freq: Once | ORAL | Status: DC
  Filled 2021-06-03: qty 592

## 2021-06-03 MED ORDER — SIMETHICONE 80 MG PO CHEW: 40.0000 mg | CHEWABLE_TABLET | Freq: Four times a day (QID) | ORAL | Status: DC | PRN

## 2021-06-03 MED ORDER — ALVIMOPAN 12 MG PO CAPS
12.0000 mg | ORAL_CAPSULE | Freq: Two times a day (BID) | ORAL | Status: DC
  Administered 2021-06-04 – 2021-06-05 (×3): 12 mg via ORAL
  Filled 2021-06-03 (×4): qty 1

## 2021-06-03 MED ORDER — BUPIVACAINE LIPOSOME 1.3 % IJ SUSP
INTRAMUSCULAR | Status: DC | PRN
  Administered 2021-06-03: 20 mL

## 2021-06-03 MED ORDER — MIDAZOLAM HCL 2 MG/2ML IJ SOLN
INTRAMUSCULAR | Status: AC
  Filled 2021-06-03: qty 2

## 2021-06-03 MED ORDER — LIDOCAINE 2% (20 MG/ML) 5 ML SYRINGE
INTRAMUSCULAR | Status: DC | PRN
  Administered 2021-06-03: 80 mg via INTRAVENOUS

## 2021-06-03 MED ORDER — SUGAMMADEX SODIUM 200 MG/2ML IV SOLN
INTRAVENOUS | Status: DC | PRN
  Administered 2021-06-03: 200 mg via INTRAVENOUS

## 2021-06-03 MED ORDER — STERILE WATER FOR INJECTION IJ SOLN
INTRAMUSCULAR | Status: AC
  Filled 2021-06-03: qty 10

## 2021-06-03 MED ORDER — FENTANYL CITRATE PF 50 MCG/ML IJ SOSY: 25.0000 ug | PREFILLED_SYRINGE | INTRAMUSCULAR | Status: DC | PRN

## 2021-06-03 MED ORDER — ONDANSETRON HCL 4 MG PO TABS: 4.0000 mg | ORAL_TABLET | Freq: Four times a day (QID) | ORAL | Status: DC | PRN

## 2021-06-03 MED ORDER — HYDROMORPHONE HCL 1 MG/ML IJ SOLN: 0.5000 mg | INTRAMUSCULAR | Status: DC | PRN

## 2021-06-03 MED ORDER — NEOMYCIN SULFATE 500 MG PO TABS: 1000.0000 mg | ORAL_TABLET | ORAL | Status: DC

## 2021-06-03 MED ORDER — LACTATED RINGERS IV SOLN: INTRAVENOUS | Status: DC | PRN

## 2021-06-03 MED ORDER — ENSURE SURGERY PO LIQD
237.0000 mL | Freq: Two times a day (BID) | ORAL | Status: DC
  Administered 2021-06-04 – 2021-06-06 (×3): 237 mL via ORAL

## 2021-06-03 MED ORDER — FENTANYL CITRATE (PF) 100 MCG/2ML IJ SOLN
INTRAMUSCULAR | Status: DC | PRN
  Administered 2021-06-03: 100 ug via INTRAVENOUS
  Administered 2021-06-03 (×2): 50 ug via INTRAVENOUS
  Administered 2021-06-03: 100 ug via INTRAVENOUS
  Administered 2021-06-03: 50 ug via INTRAVENOUS

## 2021-06-03 MED ORDER — GLYCOPYRROLATE 0.2 MG/ML IJ SOLN
INTRAMUSCULAR | Status: AC
  Filled 2021-06-03: qty 1

## 2021-06-03 MED ORDER — ACETAMINOPHEN 500 MG PO TABS
1000.0000 mg | ORAL_TABLET | Freq: Four times a day (QID) | ORAL | Status: DC
Start: 2021-06-03 — End: 2021-06-07
  Administered 2021-06-03 – 2021-06-06 (×12): 1000 mg via ORAL
  Filled 2021-06-03 (×14): qty 2

## 2021-06-03 MED ORDER — CHLORHEXIDINE GLUCONATE CLOTH 2 % EX PADS: 6.0000 | MEDICATED_PAD | Freq: Once | CUTANEOUS | Status: DC

## 2021-06-03 MED ORDER — CHLORHEXIDINE GLUCONATE 0.12 % MT SOLN
15.0000 mL | Freq: Once | OROMUCOSAL | Status: AC
  Administered 2021-06-03: 15 mL via OROMUCOSAL

## 2021-06-03 MED ORDER — PROPOFOL 10 MG/ML IV BOLUS
INTRAVENOUS | Status: DC | PRN
  Administered 2021-06-03: 150 mg via INTRAVENOUS

## 2021-06-03 MED ORDER — HEPARIN SODIUM (PORCINE) 5000 UNIT/ML IJ SOLN
5000.0000 [IU] | Freq: Once | INTRAMUSCULAR | Status: AC
  Administered 2021-06-03: 5000 [IU] via SUBCUTANEOUS
  Filled 2021-06-03: qty 1

## 2021-06-03 MED ORDER — ORAL CARE MOUTH RINSE: 15.0000 mL | Freq: Once | OROMUCOSAL | Status: AC

## 2021-06-03 MED ORDER — ONDANSETRON HCL 4 MG/2ML IJ SOLN
4.0000 mg | Freq: Four times a day (QID) | INTRAMUSCULAR | Status: DC | PRN
  Administered 2021-06-03: 4 mg via INTRAVENOUS
  Filled 2021-06-03: qty 2

## 2021-06-03 MED ORDER — SPY AGENT GREEN - (INDOCYANINE FOR INJECTION)
INTRAMUSCULAR | Status: DC | PRN
  Administered 2021-06-03: 5 mL via INTRAVENOUS

## 2021-06-03 MED ORDER — ESMOLOL HCL 100 MG/10ML IV SOLN
INTRAVENOUS | Status: DC | PRN
  Administered 2021-06-03 (×3): 10 mg via INTRAVENOUS

## 2021-06-03 MED ORDER — ROCURONIUM BROMIDE 10 MG/ML (PF) SYRINGE
PREFILLED_SYRINGE | INTRAVENOUS | Status: DC | PRN
  Administered 2021-06-03: 20 mg via INTRAVENOUS
  Administered 2021-06-03: 30 mg via INTRAVENOUS
  Administered 2021-06-03: 70 mg via INTRAVENOUS

## 2021-06-03 MED ORDER — HEPARIN SODIUM (PORCINE) 5000 UNIT/ML IJ SOLN
5000.0000 [IU] | Freq: Three times a day (TID) | INTRAMUSCULAR | Status: DC
  Administered 2021-06-03 – 2021-06-07 (×11): 5000 [IU] via SUBCUTANEOUS
  Filled 2021-06-03 (×12): qty 1

## 2021-06-03 MED ORDER — LEVOTHYROXINE SODIUM 75 MCG PO TABS
75.0000 ug | ORAL_TABLET | Freq: Every day | ORAL | Status: DC
  Administered 2021-06-04 – 2021-06-07 (×4): 75 ug via ORAL
  Filled 2021-06-03 (×4): qty 1

## 2021-06-03 MED ORDER — 0.9 % SODIUM CHLORIDE (POUR BTL) OPTIME
TOPICAL | Status: DC | PRN
  Administered 2021-06-03: 2000 mL

## 2021-06-03 MED ORDER — LACTATED RINGERS IR SOLN
Status: DC | PRN
  Administered 2021-06-03: 1000 mL

## 2021-06-03 MED ORDER — BUPIVACAINE LIPOSOME 1.3 % IJ SUSP: 20.0000 mL | Freq: Once | INTRAMUSCULAR | Status: DC

## 2021-06-03 MED ORDER — PRAVASTATIN SODIUM 40 MG PO TABS
40.0000 mg | ORAL_TABLET | Freq: Every day | ORAL | Status: DC
  Administered 2021-06-04 – 2021-06-06 (×3): 40 mg via ORAL
  Filled 2021-06-03: qty 2
  Filled 2021-06-03 (×2): qty 1
  Filled 2021-06-03 (×2): qty 2
  Filled 2021-06-03: qty 1

## 2021-06-03 MED ORDER — SODIUM CHLORIDE 0.9 % IV SOLN
2.0000 g | INTRAVENOUS | Status: AC
  Administered 2021-06-03: 2 g via INTRAVENOUS
  Filled 2021-06-03: qty 2

## 2021-06-03 MED ORDER — EPHEDRINE 5 MG/ML INJ
INTRAVENOUS | Status: AC
  Filled 2021-06-03: qty 5

## 2021-06-03 MED ORDER — METRONIDAZOLE 500 MG PO TABS: 1000.0000 mg | ORAL_TABLET | ORAL | Status: DC

## 2021-06-03 MED ORDER — IBUPROFEN 200 MG PO TABS
600.0000 mg | ORAL_TABLET | Freq: Four times a day (QID) | ORAL | Status: DC | PRN
  Administered 2021-06-06: 600 mg via ORAL
  Filled 2021-06-03: qty 3

## 2021-06-03 MED ORDER — ACETAMINOPHEN 500 MG PO TABS
1000.0000 mg | ORAL_TABLET | ORAL | Status: AC
  Administered 2021-06-03: 1000 mg via ORAL
  Filled 2021-06-03: qty 2

## 2021-06-03 MED ORDER — KETAMINE HCL 10 MG/ML IJ SOLN
INTRAMUSCULAR | Status: DC | PRN
  Administered 2021-06-03: 10 mg via INTRAVENOUS
  Administered 2021-06-03: 30 mg via INTRAVENOUS

## 2021-06-03 MED ORDER — TRAMADOL HCL 50 MG PO TABS: 50.0000 mg | ORAL_TABLET | Freq: Four times a day (QID) | ORAL | Status: DC | PRN

## 2021-06-03 MED ORDER — FENTANYL CITRATE (PF) 100 MCG/2ML IJ SOLN
INTRAMUSCULAR | Status: AC
  Filled 2021-06-03: qty 2

## 2021-06-03 MED ORDER — ENSURE PRE-SURGERY PO LIQD: 296.0000 mL | Freq: Once | ORAL | Status: DC

## 2021-06-03 MED ORDER — DEXAMETHASONE SODIUM PHOSPHATE 10 MG/ML IJ SOLN
INTRAMUSCULAR | Status: DC | PRN
  Administered 2021-06-03: 4 mg via INTRAVENOUS

## 2021-06-03 MED ORDER — TRIAMTERENE-HCTZ 37.5-25 MG PO CAPS
1.0000 | ORAL_CAPSULE | Freq: Every day | ORAL | Status: DC
  Administered 2021-06-04 – 2021-06-07 (×4): 1 via ORAL
  Filled 2021-06-03 (×8): qty 1

## 2021-06-03 MED ORDER — STERILE WATER FOR INJECTION IJ SOLN
INTRAMUSCULAR | Status: DC | PRN
  Administered 2021-06-03: 15 mL via INTRAMUSCULAR

## 2021-06-03 MED ORDER — DIPHENHYDRAMINE HCL 12.5 MG/5ML PO ELIX: 12.5000 mg | ORAL_SOLUTION | Freq: Four times a day (QID) | ORAL | Status: DC | PRN

## 2021-06-03 MED ORDER — BISACODYL 5 MG PO TBEC: 20.0000 mg | DELAYED_RELEASE_TABLET | Freq: Once | ORAL | Status: DC

## 2021-06-03 MED ORDER — ONDANSETRON HCL 4 MG/2ML IJ SOLN
INTRAMUSCULAR | Status: DC | PRN
  Administered 2021-06-03: 4 mg via INTRAVENOUS

## 2021-06-03 SURGICAL SUPPLY — 119 items
ADAPTER GOLDBERG URETERAL (ADAPTER) ×3 IMPLANT
APPLIER CLIP 5 13 M/L LIGAMAX5 (MISCELLANEOUS)
APPLIER CLIP ROT 10 11.4 M/L (STAPLE)
BAG COUNTER SPONGE SURGICOUNT (BAG) IMPLANT
BAG URO CATCHER STRL LF (MISCELLANEOUS) IMPLANT
BASKET ZERO TIP NITINOL 2.4FR (BASKET) IMPLANT
BLADE EXTENDED COATED 6.5IN (ELECTRODE) IMPLANT
CANNULA REDUC XI 12-8 STAPL (CANNULA) ×3
CANNULA REDUCER 12-8 DVNC XI (CANNULA) ×2 IMPLANT
CATH URET 5FR 28IN OPEN ENDED (CATHETERS) ×3 IMPLANT
CATH URETL OPEN END 6FR 70 (CATHETERS) ×3 IMPLANT
CELLS DAT CNTRL 66122 CELL SVR (MISCELLANEOUS) IMPLANT
CHLORAPREP W/TINT 26 (MISCELLANEOUS) ×3 IMPLANT
CLIP APPLIE 5 13 M/L LIGAMAX5 (MISCELLANEOUS) IMPLANT
CLIP APPLIE ROT 10 11.4 M/L (STAPLE) IMPLANT
CLIP LIGATING HEMO O LOK GREEN (MISCELLANEOUS) IMPLANT
COVER SURGICAL LIGHT HANDLE (MISCELLANEOUS) ×6 IMPLANT
COVER TIP SHEARS 8 DVNC (MISCELLANEOUS) ×2 IMPLANT
COVER TIP SHEARS 8MM DA VINCI (MISCELLANEOUS) ×3
DECANTER SPIKE VIAL GLASS SM (MISCELLANEOUS) ×3 IMPLANT
DEVICE TROCAR PUNCTURE CLOSURE (ENDOMECHANICALS) IMPLANT
DRAIN CHANNEL 19F RND (DRAIN) IMPLANT
DRAPE ARM DVNC X/XI (DISPOSABLE) ×8 IMPLANT
DRAPE COLUMN DVNC XI (DISPOSABLE) ×2 IMPLANT
DRAPE DA VINCI XI ARM (DISPOSABLE) ×12
DRAPE DA VINCI XI COLUMN (DISPOSABLE) ×3
DRAPE SURG IRRIG POUCH 19X23 (DRAPES) ×3 IMPLANT
DRSG OPSITE POSTOP 4X10 (GAUZE/BANDAGES/DRESSINGS) IMPLANT
DRSG OPSITE POSTOP 4X6 (GAUZE/BANDAGES/DRESSINGS) ×3 IMPLANT
DRSG OPSITE POSTOP 4X8 (GAUZE/BANDAGES/DRESSINGS) IMPLANT
DRSG TEGADERM 2-3/8X2-3/4 SM (GAUZE/BANDAGES/DRESSINGS) ×15 IMPLANT
DRSG TEGADERM 4X4.75 (GAUZE/BANDAGES/DRESSINGS) ×3 IMPLANT
ELECT REM PT RETURN 15FT ADLT (MISCELLANEOUS) ×3 IMPLANT
ENDOLOOP SUT PDS II  0 18 (SUTURE)
ENDOLOOP SUT PDS II 0 18 (SUTURE) IMPLANT
EVACUATOR SILICONE 100CC (DRAIN) IMPLANT
GAUZE SPONGE 2X2 8PLY STRL LF (GAUZE/BANDAGES/DRESSINGS) IMPLANT
GAUZE SPONGE 4X4 12PLY STRL (GAUZE/BANDAGES/DRESSINGS) IMPLANT
GLOVE SURG ENC MOIS LTX SZ7.5 (GLOVE) ×9 IMPLANT
GLOVE SURG ENC TEXT LTX SZ7.5 (GLOVE) ×3 IMPLANT
GLOVE SURG UNDER LTX SZ8 (GLOVE) ×9 IMPLANT
GOWN STRL REUS W/TWL XL LVL3 (GOWN DISPOSABLE) ×18 IMPLANT
GRASPER SUT TROCAR 14GX15 (MISCELLANEOUS) IMPLANT
GUIDEWIRE ANG ZIPWIRE 038X150 (WIRE) IMPLANT
GUIDEWIRE STR DUAL SENSOR (WIRE) ×3 IMPLANT
HOLDER FOLEY CATH W/STRAP (MISCELLANEOUS) ×3 IMPLANT
KIT PROCEDURE DA VINCI SI (MISCELLANEOUS) ×3
KIT PROCEDURE DVNC SI (MISCELLANEOUS) ×2 IMPLANT
KIT TURNOVER KIT A (KITS) ×3 IMPLANT
NEEDLE INSUFFLATION 14GA 120MM (NEEDLE) ×3 IMPLANT
PACK CARDIOVASCULAR III (CUSTOM PROCEDURE TRAY) ×3 IMPLANT
PACK COLON (CUSTOM PROCEDURE TRAY) ×3 IMPLANT
PACK CYSTO (CUSTOM PROCEDURE TRAY) ×3 IMPLANT
PAD POSITIONING PINK XL (MISCELLANEOUS) ×3 IMPLANT
PENCIL SMOKE EVACUATOR (MISCELLANEOUS) IMPLANT
PROTECTOR NERVE ULNAR (MISCELLANEOUS) ×3 IMPLANT
RELOAD STAPLER 3.5X45 BLU DVNC (STAPLE) IMPLANT
RELOAD STAPLER 3.5X60 BLU DVNC (STAPLE) IMPLANT
RELOAD STAPLER 4.3X45 GRN DVNC (STAPLE) IMPLANT
RELOAD STAPLER 4.3X60 GRN DVNC (STAPLE) ×2 IMPLANT
RTRCTR WOUND ALEXIS 18CM MED (MISCELLANEOUS)
SCISSORS LAP 5X35 DISP (ENDOMECHANICALS) IMPLANT
SEAL CANN UNIV 5-8 DVNC XI (MISCELLANEOUS) ×8 IMPLANT
SEAL XI 5MM-8MM UNIVERSAL (MISCELLANEOUS) ×12
SEALER VESSEL DA VINCI XI (MISCELLANEOUS) ×3
SEALER VESSEL EXT DVNC XI (MISCELLANEOUS) ×2 IMPLANT
SET IRRIG TUBING LAPAROSCOPIC (IRRIGATION / IRRIGATOR) ×3 IMPLANT
SLEEVE ADV FIXATION 5X100MM (TROCAR) IMPLANT
SOLUTION ELECTROLUBE (MISCELLANEOUS) ×3 IMPLANT
SPONGE GAUZE 2X2 STER 10/PKG (GAUZE/BANDAGES/DRESSINGS)
STAPLER 60 DA VINCI SURE FORM (STAPLE) ×3
STAPLER 60 SUREFORM DVNC (STAPLE) ×2 IMPLANT
STAPLER CANNULA SEAL DVNC XI (STAPLE) ×2 IMPLANT
STAPLER CANNULA SEAL XI (STAPLE) ×3
STAPLER ECHELON POWER CIR 29 (STAPLE) ×3 IMPLANT
STAPLER ECHELON POWER CIR 31 (STAPLE) IMPLANT
STAPLER RELOAD 3.5X45 BLU DVNC (STAPLE)
STAPLER RELOAD 3.5X45 BLUE (STAPLE)
STAPLER RELOAD 3.5X60 BLU DVNC (STAPLE)
STAPLER RELOAD 3.5X60 BLUE (STAPLE)
STAPLER RELOAD 4.3X45 GREEN (STAPLE)
STAPLER RELOAD 4.3X45 GRN DVNC (STAPLE)
STAPLER RELOAD 4.3X60 GREEN (STAPLE) ×3
STAPLER RELOAD 4.3X60 GRN DVNC (STAPLE) ×2
STAPLER SHEATH (SHEATH) ×3
STAPLER SHEATH ENDOWRIST DVNC (SHEATH) ×2 IMPLANT
STOPCOCK 4 WAY LG BORE MALE ST (IV SETS) IMPLANT
SURGILUBE 2OZ TUBE FLIPTOP (MISCELLANEOUS) ×3 IMPLANT
SUT MNCRL AB 4-0 PS2 18 (SUTURE) ×3 IMPLANT
SUT PDS AB 1 CT1 27 (SUTURE) IMPLANT
SUT PDS AB 1 TP1 96 (SUTURE) IMPLANT
SUT PROLENE 0 CT 2 (SUTURE) IMPLANT
SUT PROLENE 2 0 KS (SUTURE) ×3 IMPLANT
SUT PROLENE 2 0 SH DA (SUTURE) IMPLANT
SUT SILK 2 0 (SUTURE) ×3
SUT SILK 2 0 SH CR/8 (SUTURE) IMPLANT
SUT SILK 2-0 18XBRD TIE 12 (SUTURE) ×2 IMPLANT
SUT SILK 3 0 (SUTURE) ×3
SUT SILK 3 0 SH CR/8 (SUTURE) ×3 IMPLANT
SUT SILK 3-0 18XBRD TIE 12 (SUTURE) ×2 IMPLANT
SUT V-LOC BARB 180 2/0GR6 GS22 (SUTURE) ×3
SUT VIC AB 3-0 SH 18 (SUTURE) IMPLANT
SUT VIC AB 3-0 SH 27 (SUTURE)
SUT VIC AB 3-0 SH 27XBRD (SUTURE) IMPLANT
SUT VICRYL 0 UR6 27IN ABS (SUTURE) ×3 IMPLANT
SUTURE V-LC BRB 180 2/0GR6GS22 (SUTURE) ×2 IMPLANT
SYR 10ML LL (SYRINGE) ×3 IMPLANT
SYR 20ML LL LF (SYRINGE) ×9 IMPLANT
SYS LAPSCP GELPORT 120MM (MISCELLANEOUS)
SYS WOUND ALEXIS 18CM MED (MISCELLANEOUS) ×3
SYSTEM LAPSCP GELPORT 120MM (MISCELLANEOUS) IMPLANT
SYSTEM WOUND ALEXIS 18CM MED (MISCELLANEOUS) ×2 IMPLANT
TAPE UMBILICAL 1/8 X36 TWILL (MISCELLANEOUS) IMPLANT
TOWEL OR NON WOVEN STRL DISP B (DISPOSABLE) ×3 IMPLANT
TRAY FOLEY MTR SLVR 16FR STAT (SET/KITS/TRAYS/PACK) IMPLANT
TROCAR ADV FIXATION 5X100MM (TROCAR) ×3 IMPLANT
TUBING CONNECTING 10 (TUBING) ×6 IMPLANT
TUBING INSUFFLATION 10FT LAP (TUBING) ×3 IMPLANT
TUBING UROLOGY SET (TUBING) IMPLANT

## 2021-06-03 NOTE — Anesthesia Procedure Notes (Signed)
Procedure Name: Intubation Date/Time: 06/03/2021 12:47 PM Performed by: Lavina Hamman, CRNA Pre-anesthesia Checklist: Patient identified, Emergency Drugs available, Suction available, Patient being monitored and Timeout performed Patient Re-evaluated:Patient Re-evaluated prior to induction Oxygen Delivery Method: Circle system utilized Preoxygenation: Pre-oxygenation with 100% oxygen Induction Type: IV induction Ventilation: Mask ventilation without difficulty Laryngoscope Size: Mac and 4 Grade View: Grade II Tube type: Oral Tube size: 7.0 mm Number of attempts: 1 Airway Equipment and Method: Stylet Placement Confirmation: ETT inserted through vocal cords under direct vision, positive ETCO2, CO2 detector and breath sounds checked- equal and bilateral Secured at: 21 cm Tube secured with: Tape Dental Injury: Teeth and Oropharynx as per pre-operative assessment  Comments: ATOI

## 2021-06-03 NOTE — Anesthesia Preprocedure Evaluation (Addendum)
Anesthesia Evaluation  Patient identified by MRN, date of birth, ID band Patient awake    Reviewed: Allergy & Precautions, NPO status   History of Anesthesia Complications (+) PONV  Airway Mallampati: II  TM Distance: >3 FB     Dental   Pulmonary former smoker,    breath sounds clear to auscultation       Cardiovascular hypertension,  Rhythm:Regular Rate:Normal     Neuro/Psych    GI/Hepatic negative GI ROS, Neg liver ROS,   Endo/Other  diabetes  Renal/GU Renal disease     Musculoskeletal   Abdominal   Peds  Hematology   Anesthesia Other Findings   Reproductive/Obstetrics                             Anesthesia Physical Anesthesia Plan  ASA: 3  Anesthesia Plan: General   Post-op Pain Management:    Induction: Intravenous  PONV Risk Score and Plan: 4 or greater and Ondansetron and Midazolam  Airway Management Planned: Oral ETT  Additional Equipment:   Intra-op Plan:   Post-operative Plan: Extubation in OR  Informed Consent: I have reviewed the patients History and Physical, chart, labs and discussed the procedure including the risks, benefits and alternatives for the proposed anesthesia with the patient or authorized representative who has indicated his/her understanding and acceptance.     Dental advisory given  Plan Discussed with: CRNA, Anesthesiologist and Surgeon  Anesthesia Plan Comments:        Anesthesia Quick Evaluation

## 2021-06-03 NOTE — Op Note (Signed)
PATIENT: Sophia Young  65 y.o. female  Patient Care Team: Celene Squibb, MD as PCP - General (Internal Medicine)  PREOP DIAGNOSIS: COLOVAGINAL FISTULA, HISTORY OF DIVERTICULITIS  POSTOP DIAGNOSIS: COLOVAGINAL FISTULA, HISTORY OF DIVERTICULITIS  PROCEDURE:  1.  Robotic low anterior resection with takedown of colovaginal fistula 2.  Repair of vaginal cuff 3.  Incarcerated omental containing umbilical hernia repair, primary (no mesh) 4.  Intraoperative assessment of perfusion 5.  Flexible sigmoidoscopy 6.  Bilateral transversus abdominis plane blocks  SURGEON: Sharon Mt. Rae Plotner, MD  ASSISTANT: Michael Boston, MD  ANESTHESIA: General endotracheal  EBL: 100 mL Total I/O In: 1250 [I.V.:1250] Out: 250 [Urine:150; Blood:100]  DRAINS: None  SPECIMEN: Rectosigmoid colon - open end proximal  COUNTS: Sponge, needle and instrument counts were reported correct x2  FINDINGS:  Pelvic adhesions of much of sigmoid colon to where she had a prior hysterectomy. Diseased segment of sigmoid found in deep pelvis adherent to the vaginal cuff with presumed fistula. This loop of colon was also adherent to the uppermost rectum and to obtain a healthy disease free/scar free rectum for anastomosis a low anterior resection carried out. Vaginal cuff with thinning on right side at location of fistula ~1 x 1 cm in size - this was repaired/closed primarily. Incarcerated omental containing umbilical hernia - this had to be reduced to facilitate the procedure; this was then repaired primarily. ICG was administered and demonstrated avid uptake of the tracer at both the cleared colon proximally as well as at the rectal cuff. A 29 mm EEA colorectal anastomosis was fashioned at 11 cm from the anal verge by flexible sigmoidoscopy.  NARRATIVE: Informed consent was verified. She was taken to the operating room, placed supine on the operating table and SCD's were applied. General endotracheal anesthesia was induced  without difficulty. The patient was then positioned in the lithotomy position with Allen stirrups.  Arms were tucked.  Pressure points were evaluated and padded.  She was secured to the operating table.  Hair on the abdomen was clipped by the OR team.  The perineum was then prepped and draped in the standard sterile fashion for Dr. Mikle Bosworth portion of the procedure. Please refer to his notes for details regarding this portion of the procedure.  The abdomen was then prepped and draped in the standard sterile fashion. Surgical timeout was called indicating the correct patient, procedure, positioning and need for preoperative antibiotics.   An OG tube was placed by anesthesia and confirmed to be to suction.  At Palmer's point, a stab incision was created and the Veress needle was introduced into the peritoneal cavity on the first attempt.  Intraperitoneal location was confirmed by the aspiration and saline drop test.  Pneumoperitoneum was established to a maximum pressure of 15 mmHg using CO2.  Following this, the abdomen was marked for planned trocar sites.  Just to the right and cephalad to the umbilicus, an 8 mm incision was created and an 8 mm blunt tipped robotic trocar was cautiously placed into the peritoneal cavity.  The laparoscope was inserted and demonstrated no evidence of trocar site nor Veress needle site complications.  The Veress needle was removed.  Bilateral transversus abdominis plane blocks were then created using a dilute mixture of Exparel with Marcaine.  3 additional 8 mm robotic trochars were placed under direct visualization roughly in a line extending from the right ASIS towards the left upper quadrant. The bladder was inspected and noted to be at/below the pubic symphysis.  Staying 3  fingerbreadths above the pubic symphysis, an incision was created and the 12 mm robotic trocar inserted directed cephalad into the peritoneal cavity under direct visualization.  An additional 5 mm assist  port was placed in the right lateral abdomen under direct visualization.  The abdomen was surveyed and there was much of her sigmoid colon in the pelvis. There are omental containing adhesions across the low midline.  There is a second incarcerated loop of the omentum to the level of the umbilicus.  This was not able to be reduced with gentle application of pressure.  She was positioned in Trendelenburg with some left side up.  Small bowel was carefully retracted out of the pelvis.  The robot was then docked and I went to the console.   Omental containing adhesions to the low midline were carefully taken down.  These were then inspected and noted to be hemostatic.  The incarcerated omental containing umbilical hernia was then able to be reduced by transecting the omentum at the level of the peritoneum.  We were then able to invaginate the umbilical stalk and remove the remaining omentum.  This was then delivered through one of our ports.  There is an approximate 1 x 1 cm fascial defect at the umbilicus.  The sigmoid colon was readily identified.  There are adhesions of her sigmoid colon in her pelvis looping around to the left.  These were carefully mobilized sharply.  We then approached the diseased segment of sigmoid colon which is adherent to her vaginal cuff.  The diseased segment of colon is carefully mobilized off the vaginal cuff using sharp dissection.  Additional adhesions of the sigmoid colon were then carefully freed sharply.  We are able to identify the left ureter at the level of the pelvic sidewall and the left as well as traced this up to the pelvic brim.  Attachments of the sigmoid colon were taken down from the intersigmoid fossa.  The rectosigmoid colon was grasped and elevated anteriorly.  There was a hairpin turn of her colon and therefore a lateral to medial approach was selected.  The sigmoid mesentery was fully mobilized off of the intersigmoid fossa.  The left ureter was identified and  protected free of injury.  The descending colon was then mobilized all the way up to the level of the splenic flexure.  This was partially mobilized as well.  There was more than adequate reach of the descending colon into the deep pelvis without any retraction or tension.  Attention was then directed at a medial to lateral approach, the peritoneum overlying the presacral space was carefully incised on the right.  The TME plane was readily gained working in a plane between the fascia propria of the rectum and the presacral fascia.  Hypogastric nerves were seen going along the the presacral fascia and were protected free of injury.  Working more proximally, the mesorectum and sigmoid mesentery were carefully mobilized off of the peritoneum.  The left ureter was identified and protected free of injury. The superior hemorrhoidal and IMA pedicles were identified. Further mesocolon was mobilized proximally staying in this plane between the retroperitoneum proper and the mesocolon. The sigmoid colon was retracted to the right.  The disease extends distally over the proximalmost aspect of her rectum.  Therefore, a low anterior resection is planned.  The left ureter again was confirmed to be well away for plan dissection and well away from the vasculature which had been dissected medially.  The rectosigmoid colon was elevated anteriorly.  The left ureter was re-identified. The IMA was clear of this. The IMA was then divided with the vessel sealer. The stump was inspected and noted to be completely hemostatic with a good seal.  The mesentery was divided out to the point of planned proximal division where the colon is healthy and supple.  Working more distally, the rectum was identified at a healthy location below where the tinea had splayed and there were loss of appendices epiploica.  This is just distal to the sacral promontory.  Anatomically, this clearly represents the proximal rectum.  The mesentery out to this  level was then cleared using the vessel sealer. The distal point of transection on the proximal rectum was identified.   A 60 mm green load robotic stapler was then placed through the 12 mm port and introduced into the peritoneal cavity.  The rectum was divided with 1 firing of the stapler.  The stump was intact and healthy in appearance.   Attention was turned to performing a perfusion test. ICG was administered by anesthesia and at the level of the cleared mesentery proximally, there was excellent uptake of the tracer.  The rectum was also well perfused in appearance.  There was a visible pulse in the mesentery out to the level of the cleared colon at the level of the proximal sigmoid/descending colon junction.  This colon is also supple and healthy in appearance without any thickening.  This reached into the pelvis without any difficulty and remained in that location without any tension.  I then went below and did a vaginal exam under laparoscopic visualization.  The area where the sigmoid colon was attached to her vaginal cuff was inspected.  There is thinning at this location and an approximate 1 cm hole.  I went back to the console.  The ureters were identified and well away from the fistula defect.  So as not to cause any sort of narrowing around her bladder neck or ureters, peritoneum was simply imbricated over the prior fistula defect using a 2-0 V-loc suture.  The repair was then inspected and confirmed to be closed.  A locking grasper was then placed on the sigmoid staple line.   Attention was turned to the extracorporeal portion of the procedure.  The robot was undocked.  I scrubbed back in.  Using the 12 mm trocar site, a Pfannenstiel incision was created and incorporated the fascial opening through the 12 mm port site.  The rectus fascia was incised and then elevated.  The rectus muscle was mobilized free of the overlying fascia.  The peritoneum was incised in the midline well above the location  of the bladder.  An Vinton wound protector was placed.  Towels were placed around the field.  The divided colon was passed through the wound protector.  The point of proximal division was identified and was again on a healthy segment of supple colon with a palpable pulse in the mesentery. This was pink in color.  A pursestring device was applied.  A 2-0 Prolene on a Keith needle was passed.  The colon was divided and passed off with the open end being proximal.  EEA sizers were then introduced and a 29 mm EEA selected.  "Belt loops" consisting of 3-0 silk were placed around the pursestring suture line.  The anvil was placed and the pursestring tied.  A small amount of fat was cleared from the planned anastomosis and no diverticula were apparent within this.  This was placed back into the  abdomen and a cap placed over the wound protector port site.  Pneumoperitoneum was reestablished.  I then went below to pass the stapler.  My partner remained above.  EEA sizers were cautiously passed trans-anally under direct visualization.  The stapler was passed and the spike deployed just anterior to the staple line.  The components were then mated.  Orientation was confirmed such that there is no twisting of the colon nor small bowel underneath the mesenteric defect. Care was taken to ensure no other structures were incorporated within this either.  The stapler was then closed, held, and fired. This was then removed. The donuts were inspected and noted to be complete.  The colon proximal to the anastomosis was then gently occluded. The pelvis was filled with sterile irrigation. Under direct visualization, I passed a flexible sigmoidoscope.  The anastomosis was under water.  With good distention of the anastomosis there was no air leak. The anastomosis pink in appearance.  This is located at 12 cm from the anal verge by flexible sigmoidoscopy.  It is hemostatic.  Additionally, looking from above, there is no tension on the colon  or mesentery.  Sigmoidoscope was withdrawn.  Irrigation was evacuated from the pelvis.  The abdomen and pelvis are surveyed and noted to be completely hemostatic without any apparent injury.  I scrubbed back in.  We turned our attention to closure of the umbilical fascial defect.  A single small stab incision is made at the umbilicus.  Using a laparoscopic suture passer, the umbilical fascia is closed using two #1 PDS sutures passed under laparoscopic visualization.  The fascia is palpated noted to be completely closed.  Under direct visualization, all trochars are removed.  The Baldwin wound protector was removed.  Gowns/gloves are changed and a fresh set of clean instruments utilized. Additional sterile drapes were placed around the field.   Sponge, needle, and instrument counts were reported correct.  The Pfannenstiel peritoneum was closed with a running 2-0 Vicryl suture.  The rectus fascia was then closed using 2 running #1 PDS sutures.  The fascia was then palpated and noted to be completely closed.  Additional anesthetic was infiltrated at the Pfannenstiel site.  Sponge, needle, and instrument counts were then again reported correct. 4-0 Monocryl subcuticular suture was used to close the skin of all incision sites.  Dermabond was placed over all incisions.  A honeycomb dressing placed over the Pfannenstiel as well.  The ureteral catheters are removed and noted to be intact.  The Foley is placed back to gravity drainage   She is taken out of lithotomy, awakened from anesthesia, extubated, and transferred to a stretcher for transport to PACU in satisfactory condition having tolerated the procedure well.

## 2021-06-03 NOTE — Op Note (Signed)
Preoperative diagnosis: Left hydronephrosis with bowel vaginal fistula Postoperative diagnosis: Left hydronephrosis with bowel vaginal fistula Surgery: Cystoscopy left retrograde ureterogram's with firefly and insertion of ureteral catheters and Foley catheter Surgeon: Dr. Nicki Reaper Tait Balistreri  The patient has the above diagnosis and consented the above procedure.  General surgery wanted the above procedure.  Preoperative antibiotics had been given.  I reviewed the CT scan prior and she is known to have some left hydronephrosis with a UPJ obstruction  Patient underwent rigid cystoscopy with 24 [place] scope.  The urine was cloudy.  I irrigated well and looked around the bladder twice and was no fistula.  She has a small cystocele.  Ureteral orifices were normal  Under fluoroscopic and cystoscopic guidance I passed a sensor wire to the low pelvic ureter followed by an open-ended ureteral catheter.  I used 7.5 cc of contrast I was a 50-50 mixture between the firefly dye and contrast.  She had a normal size ureter with hydronephrosis of left renal pelvis and calyces.    Sensor wire was then passed curling in the upper pole calyx.  Open-ended ureteral catheter was then passed 6 [place] in size to the appropriate location.  Sensor wire was removed.  Scope was carefully separated from ureteral catheter which was left in place  Identical procedure was done on the right  Under fluoroscopic and cystoscopic guidance I passed a sensor wire to the low pelvic ureter followed by an open-ended ureteral catheter.  I used 7.5 cc of contrast I was a 50-50 mixture between the firefly dye and contrast.  She had a normal size ureter with hydronephrosis of left renal pelvis and calyces.    Sensor wire was then passed curling in the upper pole calyx.  Open-ended ureteral catheter was then passed 6 [place] in size to the appropriate location.  Sensor wire was removed.  Scope was carefully separated from ureteral catheter which  was left in place  At the end of the case I checked the position of both ureteral catheters and they were good position.  Foley catheter was placed with connector to Foley catheter.  2 0 silks were used to help stabilize open ureteral catheters.  Patient will be followed as per protocol

## 2021-06-03 NOTE — H&P (Signed)
CC: Here today for surgery  HPI: Sophia Young is a very pleasant 65yoF hx HTN, DM (on PO hypoglycemics), HLD presents to our office or evaluation of a possible colovaginal fistula.  She has been followed with Dr. Noah Delaine for a left ureteral UPJ obstruction.  She has had recurrent urinary tract infections.  She had a ureteral stent in place because of double-J stent for approximately 3 weeks.  This was removed.  She had interval CT scans completed while the stent was in place that demonstrated the left ureter to be away from her sigmoid colon and associated prior inflammatory areas.  For proximally 7 months she has struggled with stool and gas passing from her vagina.  She otherwise has good control from a anorectal perspective and even if having loose stool can use the restroom when convenient without having any accidents.  Because of the stool leaking from the vagina she will wear a pad.  She has also noticed flatus passing through this route as well.  This has caused significant disturbances in her quality of life and she is currently not working.  She had vaginoscopy completed with Dr. Noah Delaine as well as demonstrates feculent material within the vaginal vault.  She reports that she does carry a diagnosis of having had diverticulitis before.  She reports 2 separate attacks of pain and being told that she had some inflammation in her colon.  She was treated antibiotics and had resolution.  She denies any history of percutaneous drain placements or known abscesses.  She also has a history of issues with her left ureter dating back to at least 2011 and has had recurrent UTIs that was felt to be related to a possible intrinsic stenosis of her left ureter that again well away from her sigmoid colon on recent imaging.  Colonoscopy with Dr. Gala Romney 05/2017 showed a sectional area polyp that was removed from the ascending colon.  Diverticulosis and the entire colon.  Congested mucosa and distal descending  query focal diverticulitis.  Biopsied.  The segment returned with inflammation and reactive changes without adenomatous change.  The ascending colon polyp was a tubular adenoma.  She denies any changes in her health or health history - tolerated her bowel prep with satisfactory result, including antibiotics. States she is ready for surgery   PMH: HTN, DM (on PO hypoglycemics), HLD  PSH: Hysterectomy via Pfannenstiel incision.  Laparoscopic cholecystectomy.  FHx: Denies FHx of colorectal, breast, endometrial, ovarian or cervical cancer  Social: Denies use of tobacco/EtOH/drugs. Usually works for [employer].  ROS: A comprehensive 10 system review of systems was completed with the patient and pertinent findings as noted above.   Past Medical History:  Diagnosis Date   Chronic back pain    Complication of anesthesia    Diabetes mellitus without complication ([place])    History of kidney stones    Hydronephrosis of left kidney    chronic   Hypercholesteremia    Hypertension    PONV (postoperative nausea and vomiting)    Ureteral stricture, left     Past Surgical History:  Procedure Laterality Date   ABDOMINAL HYSTERECTOMY     BACK SURGERY     BIOPSY  06/01/2017   Procedure: BIOPSY;  Surgeon: Daneil Dolin, MD;  [facility];  Service: Endoscopy;;  colon   CHOLECYSTECTOMY     COLONOSCOPY N/A 06/01/2017   Procedure: COLONOSCOPY;  Surgeon: Daneil Dolin, MD;  [facility];  Service: Endoscopy;  Laterality: N/A;  7:30 AM   COLONOSCOPY WITH PROPOFOL N/A 02/27/2021   Procedure: COLONOSCOPY WITH PROPOFOL;  Surgeon: Eloise Harman, DO;  [facility];  Service: Endoscopy;  Laterality: N/A;  10:30am   CYSTOSCOPY W/ RETROGRADES Bilateral 11/06/2020   Procedure: CYSTOSCOPY WITH RETROGRADE PYELOGRAM;  Surgeon: Cleon Gustin, MD;  Location: [facility];  Service: Urology;  Laterality: Bilateral;   CYSTOSCOPY W/ URETERAL STENT PLACEMENT Left 11/06/2020   Procedure:  CYSTOSCOPY WITH STENT REPLACEMENT;  Surgeon: Cleon Gustin, MD;  Location: [facility];  Service: Urology;  Laterality: Left;   POLYPECTOMY  06/01/2017   Procedure: POLYPECTOMY;  Surgeon: Daneil Dolin, MD;  [facility];  Service: Endoscopy;;  colon    SP DIL URETER Left 2011   URETERAL STENT PLACEMENT Left 2011   URETEROSCOPY Left 11/06/2020   Procedure: URETEROSCOPY- diagnostic;  Surgeon: Cleon Gustin, MD;  Location: [facility];  Service: Urology;  Laterality: Left;    Family History  Problem Relation Age of Onset   Prostate cancer Brother    Crohn's disease Cousin    Colon cancer Neg Hx     Social:  reports that she has quit smoking. Her smoking use included cigarettes. She has a 1.50 pack-year smoking history. She has never used smokeless tobacco. She reports that she does not drink alcohol and does not use drugs.  Allergies: No Known Allergies  Medications: I have reviewed the patient's current medications.  Results for orders placed or performed during the hospital encounter of 06/03/21 (from the past 48 hour(s))  Glucose, capillary     Status: Abnormal   Collection Time: 06/03/21 10:10 AM  Result Value Ref Range   Glucose-Capillary 233 (H) 70 - 99 mg/dL    Comment: Glucose reference range applies only to samples taken after fasting for at least 8 hours.    No results found.  ROS - all of the below systems have been reviewed with the patient and positives are indicated with bold text General: chills, fever or night sweats Eyes: blurry vision or double vision ENT: epistaxis or sore throat Allergy/Immunology: itchy/watery eyes or nasal congestion Hematologic/Lymphatic: bleeding problems, blood clots or swollen lymph nodes Endocrine: temperature intolerance or unexpected weight changes Breast: new or changing breast lumps or nipple discharge Resp: cough, shortness of breath, or wheezing CV: chest pain or dyspnea on exertion GI: as per HPI GU: dysuria,  trouble voiding, or hematuria MSK: joint pain or joint stiffness Neuro: TIA or stroke symptoms Derm: pruritus and skin lesion changes Psych: anxiety and depression  PE Blood pressure (!) 191/86, pulse 88, temperature 98 F (36.7 C), temperature source Oral, resp. rate 15, height 5\' 3"  (1.6 m), weight 87.7 kg, SpO2 99 %. Constitutional: NAD; conversant; wearing mask Eyes: Moist conjunctiva; no lid lag Lungs: Normal respiratory effort CV: RRR; no pitting edema GI: Abd soft, NT/ND; no palpable hepatosplenomegaly MSK: Normal range of motion of extremities; no clubbing/cyanosis Psychiatric: Appropriate affect; alert and oriented x 3  Results for orders placed or performed during the hospital encounter of 06/03/21 (from the past 48 hour(s))  Glucose, capillary     Status: Abnormal   Collection Time: 06/03/21 10:10 AM  Result Value Ref Range   Glucose-Capillary 233 (H) 70 - 99 mg/dL    Comment: Glucose reference range applies only to samples taken after fasting for at least 8 hours.    No results found.   A/P: Sophia Young is a very pleasant 69yoF with hx HTN, HLD, DM -  here with clinically significant colovaginal fistula; hx of L ureteral issues dating back at least 11 years; previously has had double J stent in place   -The anatomy and physiology of the GI tract was discussed at length with the patient. The pathophysiology of diverticulitis and colovaginal fistulas was discussed with associated pictures. -We reviewed options going forward including further observation versus surgery. We discussed robotic-assisted sigmoidectomy/low anterior resection, takedown of colovaginal fistula, flexible sigmoidoscopy; cystoscopy/ureteral stents & ICG firefly by [facility]  -The planned procedures, material risks (including, but not limited to, pain, bleeding, infection, scarring, need for blood transfusion, damage to surrounding structures- blood vessels/nerves/viscus/organs, damage to ureter,  urine leak, leak from anastomosis, need for additional procedures, recurrence of colovaginal fistula, worsening of pre-existing medical conditions, need for stoma which could be permanent, hernia, recurrence, DVT/PE, pneumonia, heart attack, stroke, death) benefits and alternatives to surgery were discussed at length. We noted a good probability that the procedure would help improve their symptoms but are unlikely to resolve the chronic left ureteral issues she has experienced before. The patient's questions were answered to her satisfaction, she voiced understanding and elected to proceed with surgery. Additionally, we discussed typical postoperative expectations and the recovery process.  Sophia Landau, MD [facility] Use [email or web address] to contact on call provider

## 2021-06-03 NOTE — Plan of Care (Signed)
Patient arrived to floor and ambulated to bed.

## 2021-06-03 NOTE — Transfer of Care (Signed)
Immediate Anesthesia Transfer of Care Note  Patient: Fani Rotondo [place]  Procedure(s) Performed: ROBOTIC LOW ANTERIOR RESECTION AND  TAKEDOWN OF COLOGAVINAL FISTULA/POSTERIOR VAGINORRHAPHY WITH BILATERAL TAP BLOCK, INTRAOPERATIVE ASSESSMENT OF PERFUSION USING FIREFLY. PRIMARY REPAIR OF UMBILICAL HERNIA (Abdomen) FLEXIBLE SIGMOIDOSCOPY CYSTOSCOPY with FIREFLY INJECTION AND BILATERAL RETROGRADES, BILATERAL OPEN ENDED CATHETER PLACEMENT  Patient Location: PACU  Anesthesia Type:General  Level of Consciousness: drowsy  Airway & Oxygen Therapy: Patient Spontanous Breathing and Patient connected to face mask oxygen  Post-op Assessment: Report given to RN and Post -op Vital signs reviewed and stable  Post vital signs: Reviewed and stable  Last Vitals:  Vitals Value Taken Time  BP 162/89 06/03/21 1622  Temp    Pulse 72 06/03/21 1624  Resp 14 06/03/21 1624  SpO2 99 % 06/03/21 1624  Vitals shown include unvalidated device data.  Last Pain:  Vitals:   06/03/21 1017  TempSrc:   PainSc: 0-No pain      Patients Stated Pain Goal: 4 (56/43/32 9518)  Complications: No notable events documented.

## 2021-06-04 ENCOUNTER — Encounter (HOSPITAL_COMMUNITY): Payer: Self-pay | Admitting: Surgery

## 2021-06-04 LAB — BASIC METABOLIC PANEL
Anion gap: 6 (ref 5–15)
BUN: 10 mg/dL (ref 8–23)
CO2: 25 mmol/L (ref 22–32)
Calcium: 8.7 mg/dL — ABNORMAL LOW (ref 8.9–10.3)
Chloride: 106 mmol/L (ref 98–111)
Creatinine, Ser: 0.89 mg/dL (ref 0.44–1.00)
GFR, Estimated: >60 mL/min (ref >60)
Glucose, Bld: 184 mg/dL — ABNORMAL HIGH (ref 70–99)
Potassium: 3.7 mmol/L (ref 3.5–5.1)
Sodium: 137 mmol/L (ref 135–145)

## 2021-06-04 LAB — CBC
HCT: 38.2 % (ref 36.0–46.0)
Hemoglobin: 13.3 g/dL (ref 12.0–15.0)
MCH: 30.6 pg (ref 26.0–34.0)
MCHC: 34.8 g/dL (ref 30.0–36.0)
MCV: 87.8 fL (ref 80.0–100.0)
Platelets: 229 10*3/uL (ref 150–400)
RBC: 4.35 MIL/uL (ref 3.87–5.11)
RDW: 13.7 % (ref 11.5–15.5)
WBC: 13.9 10*3/uL — ABNORMAL HIGH (ref 4.0–10.5)
nRBC: 0 % (ref 0.0–0.2)

## 2021-06-04 NOTE — Progress Notes (Signed)
  Subjective No acute events. Feeling reasonably well. No nausea, nor vomiting. Soreness at incisions but reports controlled with tylenol. Ambulating. No flatus/BM yet. Tolerating full liquids without issue.  Objective: Vital signs in last 24 hours: Temp:  [97.7 F (36.5 C)-99.5 F (37.5 C)] 98.8 F (37.1 C) (09/22 0513) Pulse Rate:  [55-88] 61 (09/22 0513) Resp:  [13-19] 17 (09/22 0513) BP: (126-191)/(61-89) 126/73 (09/22 0513) SpO2:  [95 %-100 %] 97 % (09/22 0513) Weight:  [87.7 kg] 87.7 kg (09/21 1017) Last BM Date: 06/03/21  Intake/Output from previous day: 09/21 0701 - 09/22 0700 In: 2977.7 [P.O.:210; I.V.:2667.7; IV Piggyback:100] Out: 580 [Urine:480; Blood:100] Intake/Output this shift: No intake/output data recorded.  Gen: NAD, comfortable CV: RRR Pulm: Normal work of breathing Abd: Soft, appropriate incisional tenderness, non distended; no rebound nor guarding Ext: SCDs in place  Lab Results: CBC  Recent Labs    06/04/21 0424  WBC 13.9*  HGB 13.3  HCT 38.2  PLT 229   BMET Recent Labs    06/04/21 0424  NA 137  K 3.7  CL 106  CO2 25  GLUCOSE 184*  BUN 10  CREATININE 0.89  CALCIUM 8.7*   PT/INR No results for input(s): LABPROT, INR in the last 72 hours. ABG No results for input(s): PHART, HCO3 in the last 72 hours.  Invalid input(s): PCO2, PO2  Studies/Results:  Anti-infectives: Anti-infectives (From admission, onward)    Start     Dose/Rate Route Frequency Ordered Stop   06/03/21 1400  neomycin (MYCIFRADIN) tablet 1,000 mg  Status:  Discontinued       See Hyperspace for full Linked Orders Report.   1,000 mg Oral 3 times per day 06/03/21 1011 06/03/21 1015   06/03/21 1400  metroNIDAZOLE (FLAGYL) tablet 1,000 mg  Status:  Discontinued       See Hyperspace for full Linked Orders Report.   1,000 mg Oral 3 times per day 06/03/21 1011 06/03/21 1015   06/03/21 1015  cefoTEtan (CEFOTAN) 2 g in sodium chloride 0.9 % 100 mL IVPB        2 g 200  mL/hr over 30 Minutes Intravenous On call to O.R. 06/03/21 1011 06/03/21 1228        Assessment/Plan: Patient Active Problem List   Diagnosis Date Noted   S/P laparoscopic-assisted sigmoidectomy 06/03/2021   History of adenomatous polyp of colon 02/25/2021   Colovaginal fistula 02/25/2021   UPJ obstruction, congenital 12/19/2020   Vesicointestinal fistula 11/14/2020   H/O diverticulitis of colon 07/26/2017   s/p Procedure(s): ROBOTIC LOW ANTERIOR RESECTION AND  TAKEDOWN OF COLOGAVINAL FISTULA/POSTERIOR VAGINORRHAPHY WITH BILATERAL TAP BLOCK, INTRAOPERATIVE ASSESSMENT OF PERFUSION USING FIREFLY. PRIMARY REPAIR OF UMBILICAL HERNIA FLEXIBLE SIGMOIDOSCOPY CYSTOSCOPY with FIREFLY INJECTION AND BILATERAL RETROGRADES, BILATERAL OPEN ENDED CATHETER PLACEMENT 06/03/2021  -Doing quite well -Full liquids, advancing to soft as tolerated -Ambulate 5x/day -D/C Foley, D/C MIVF -Continue entereg -Ppx: SQH, SCDs -We spent time reviewing her procedure, findings, plans moving forward  LOS: 1 day   Nadeen Landau, MD [facility] Use [email or web address] to contact on call provider

## 2021-06-05 LAB — BASIC METABOLIC PANEL
Anion gap: 10 (ref 5–15)
BUN: 11 mg/dL (ref 8–23)
CO2: 27 mmol/L (ref 22–32)
Calcium: 9.6 mg/dL (ref 8.9–10.3)
Chloride: 105 mmol/L (ref 98–111)
Creatinine, Ser: 0.85 mg/dL (ref 0.44–1.00)
GFR, Estimated: >60 mL/min (ref >60)
Glucose, Bld: 187 mg/dL — ABNORMAL HIGH (ref 70–99)
Potassium: 3.7 mmol/L (ref 3.5–5.1)
Sodium: 142 mmol/L (ref 135–145)

## 2021-06-05 LAB — CBC
HCT: 40.4 % (ref 36.0–46.0)
Hemoglobin: 13.7 g/dL (ref 12.0–15.0)
MCH: 30.9 pg (ref 26.0–34.0)
MCHC: 33.9 g/dL (ref 30.0–36.0)
MCV: 91.2 fL (ref 80.0–100.0)
Platelets: 216 10*3/uL (ref 150–400)
RBC: 4.43 MIL/uL (ref 3.87–5.11)
RDW: 14 % (ref 11.5–15.5)
WBC: 12.6 10*3/uL — ABNORMAL HIGH (ref 4.0–10.5)
nRBC: 0 % (ref 0.0–0.2)

## 2021-06-05 LAB — SURGICAL PATHOLOGY

## 2021-06-05 MED ORDER — TRAMADOL HCL 50 MG PO TABS: 50.0000 mg | ORAL_TABLET | Freq: Four times a day (QID) | ORAL | 0 refills | Status: AC | PRN

## 2021-06-05 NOTE — Progress Notes (Signed)
  Subjective No acute events. Feeling reasonably well. No nausea, nor vomiting. Soreness at incisions but reports controlled with tylenol. Ambulating. No flatus/BM yet. Tolerating full liquids without issue. Denies bloating.  Objective: Vital signs in last 24 hours: Temp:  [97.8 F (36.6 C)-98.9 F (37.2 C)] 98.5 F (36.9 C) (09/23 0606) Pulse Rate:  [62-72] 72 (09/23 0606) Resp:  [16-18] 16 (09/23 0606) BP: (142-155)/(66-98) 154/77 (09/23 0606) SpO2:  [93 %-97 %] 93 % (09/23 0606) Last BM Date: 06/03/21  Intake/Output from previous day: 09/22 0701 - 09/23 0700 In: -  Out: 2000 [Urine:2000] Intake/Output this shift: No intake/output data recorded.  Gen: NAD, comfortable CV: RRR Pulm: Normal work of breathing Abd: Soft, appropriate incisional tenderness, non distended; no rebound nor guarding Ext: SCDs in place  Lab Results: CBC  Recent Labs    06/04/21 0424 06/05/21 0413  WBC 13.9* 12.6*  HGB 13.3 13.7  HCT 38.2 40.4  PLT 229 216   BMET Recent Labs    06/04/21 0424 06/05/21 0413  NA 137 142  K 3.7 3.7  CL 106 105  CO2 25 27  GLUCOSE 184* 187*  BUN 10 11  CREATININE 0.89 0.85  CALCIUM 8.7* 9.6   PT/INR No results for input(s): LABPROT, INR in the last 72 hours. ABG No results for input(s): PHART, HCO3 in the last 72 hours.  Invalid input(s): PCO2, PO2  Studies/Results:  Anti-infectives: Anti-infectives (From admission, onward)    Start     Dose/Rate Route Frequency Ordered Stop   06/03/21 1400  neomycin (MYCIFRADIN) tablet 1,000 mg  Status:  Discontinued       See Hyperspace for full Linked Orders Report.   1,000 mg Oral 3 times per day 06/03/21 1011 06/03/21 1015   06/03/21 1400  metroNIDAZOLE (FLAGYL) tablet 1,000 mg  Status:  Discontinued       See Hyperspace for full Linked Orders Report.   1,000 mg Oral 3 times per day 06/03/21 1011 06/03/21 1015   06/03/21 1015  cefoTEtan (CEFOTAN) 2 g in sodium chloride 0.9 % 100 mL IVPB        2 g 200  mL/hr over 30 Minutes Intravenous On call to O.R. 06/03/21 1011 06/03/21 1228        Assessment/Plan: Patient Active Problem List   Diagnosis Date Noted   S/P laparoscopic-assisted sigmoidectomy 06/03/2021   History of adenomatous polyp of colon 02/25/2021   Colovaginal fistula 02/25/2021   UPJ obstruction, congenital 12/19/2020   Vesicointestinal fistula 11/14/2020   H/O diverticulitis of colon 07/26/2017   s/p Procedure(s): ROBOTIC LOW ANTERIOR RESECTION AND  TAKEDOWN OF COLOGAVINAL FISTULA/POSTERIOR VAGINORRHAPHY WITH BILATERAL TAP BLOCK, INTRAOPERATIVE ASSESSMENT OF PERFUSION USING FIREFLY. PRIMARY REPAIR OF UMBILICAL HERNIA FLEXIBLE SIGMOIDOSCOPY CYSTOSCOPY with FIREFLY INJECTION AND BILATERAL RETROGRADES, BILATERAL OPEN ENDED CATHETER PLACEMENT 06/03/2021  -Doing quite well -Soft diet -Ambulate 5x/day -Continue entereg -Ppx: SQH, SCDs -Possible discharge tomorrow if having reliable bowel function   LOS: 2 days   Nadeen Landau, MD [facility] Use [email or web address] to contact on call provider

## 2021-06-05 NOTE — Discharge Instructions (Signed)
POST OP INSTRUCTIONS AFTER COLON SURGERY  DIET: Be sure to include lots of fluids daily to stay hydrated - 64oz of water per day (8, 8 oz glasses).  Avoid fast food or heavy meals for the first couple of weeks as your are more likely to get nauseated. Avoid raw/uncooked fruits or vegetables for the first 4 weeks (its ok to have these if they are blended into smoothie form). If you have fruits/vegetables, make sure they are cooked until soft enough to mash on the roof of your mouth and chew your food well. Otherwise, diet as tolerated.  Take your usually prescribed home medications unless otherwise directed.  PAIN CONTROL: Pain is best controlled by a usual combination of three different methods TOGETHER: Ice/Heat Over the counter pain medication Prescription pain medication Most patients will experience some swelling and bruising around the surgical site.  Ice packs or heating pads (30-60 minutes up to 6 times a day) will help. Some people prefer to use ice alone, heat alone, alternating between ice & heat.  Experiment to what works for you.  Swelling and bruising can take several weeks to resolve.   It is helpful to take an over-the-counter pain medication regularly for the first few weeks: Ibuprofen (Motrin/Advil) - 200mg tabs - take 3 tabs (600mg) every 6 hours as needed for pain (unless you have been directed previously to avoid NSAIDs/ibuprofen) Acetaminophen (Tylenol) - you may take 650mg every 6 hours as needed. You can take this with motrin as they act differently on the body. If you are taking a narcotic pain medication that has acetaminophen in it, do not take over the counter tylenol at the same time. NOTE: You may take both of these medications together - most patients  find it most helpful when alternating between the two (i.e. Ibuprofen at 6am, tylenol at 9am, ibuprofen at 12pm ...) A  prescription for pain medication should be given to you upon discharge.  Take your pain medication as  prescribed if your pain is not adequatly controlled with the over-the-counter pain reliefs mentioned above.  Avoid getting constipated.  Between the surgery and the pain medications, it is common to experience some constipation.  Increasing fluid intake and taking a fiber supplement (such as Metamucil, Citrucel, FiberCon, MiraLax, etc) 1-2 times a day regularly will usually help prevent this problem from occurring.  A mild laxative (prune juice, Milk of Magnesia, MiraLax, etc) should be taken according to package directions if there are no bowel movements after 48 hours.    Dressing: Your incisions are covered in Dermabond which is like sterile superglue for the skin. This will come off on it's own in a couple weeks. It is waterproof and you may bathe normally starting the day after your surgery in a shower. Avoid baths/pools/lakes/oceans until your wounds have fully healed.  ACTIVITIES as tolerated:   Avoid heavy lifting (>10lbs or 1 gallon of milk) for the next 6 weeks. You may resume regular daily activities as tolerated--such as daily self-care, walking, climbing stairs--gradually increasing activities as tolerated.  If you can walk 30 minutes without difficulty, it is safe to try more intense activity such as jogging, treadmill, bicycling, low-impact aerobics.  DO NOT PUSH THROUGH PAIN.  Let pain be your guide: If it hurts to do something, don't do it. You may drive when you are no longer taking prescription pain medication, you can comfortably wear a seatbelt, and you can safely maneuver your car and apply brakes.  FOLLOW UP in our   office Please call [facility] at [phone] to set up an appointment to see your surgeon in the office for a follow-up appointment approximately 2 weeks after your surgery. Make sure that you call for this appointment the day you arrive home to insure a convenient appointment time.  9. If you have disability or family leave forms that need to be completed, you may have  them completed by your primary care physician's office; for return to work instructions, please ask our office staff and they will be happy to assist you in obtaining this documentation   When to call us [phone]: Poor pain control Reactions / problems with new medications (rash/itching, etc)  Fever over 101.5 F (38.5 C) Inability to urinate Nausea/vomiting Worsening swelling or bruising Continued bleeding from incision. Increased pain, redness, or drainage from the incision  The clinic staff is available to answer your questions during regular business hours (8:30am-5pm).  Please don't hesitate to call and ask to speak to one of our nurses for clinical concerns.   A surgeon from [facility] is always on call at the hospitals   If you have a medical emergency, go to the nearest emergency room or call 911.  [facility] [address], [address], [place], [place]  [address] MAIN: [phone] FAX: [phone] [email or web address]  

## 2021-06-05 NOTE — Anesthesia Postprocedure Evaluation (Signed)
Anesthesia Post Note  Patient: Sophia Young  Procedure(s) Performed: ROBOTIC LOW ANTERIOR RESECTION AND  TAKEDOWN OF COLOGAVINAL FISTULA/POSTERIOR VAGINORRHAPHY WITH BILATERAL TAP BLOCK, INTRAOPERATIVE ASSESSMENT OF PERFUSION USING FIREFLY. PRIMARY REPAIR OF UMBILICAL HERNIA (Abdomen) FLEXIBLE SIGMOIDOSCOPY CYSTOSCOPY with FIREFLY INJECTION AND BILATERAL RETROGRADES, BILATERAL OPEN ENDED CATHETER PLACEMENT     Patient location during evaluation: PACU Anesthesia Type: General Level of consciousness: awake Pain management: pain level controlled Vital Signs Assessment: post-procedure vital signs reviewed and stable Respiratory status: spontaneous breathing Cardiovascular status: stable Postop Assessment: no apparent nausea or vomiting Anesthetic complications: no   No notable events documented.  Last Vitals:  Vitals:   06/04/21 2111 06/05/21 0606  BP: (!) 155/66 (!) 154/77  Pulse: 66 72  Resp: 16 16  Temp: 37.2 C 36.9 C  SpO2: 95% 93%    Last Pain:  Vitals:   06/05/21 0800  TempSrc:   PainSc: 2                  Sophia Young

## 2021-06-06 LAB — BASIC METABOLIC PANEL
Anion gap: 9 (ref 5–15)
BUN: 13 mg/dL (ref 8–23)
CO2: 26 mmol/L (ref 22–32)
Calcium: 8.7 mg/dL — ABNORMAL LOW (ref 8.9–10.3)
Chloride: 100 mmol/L (ref 98–111)
Creatinine, Ser: 0.99 mg/dL (ref 0.44–1.00)
GFR, Estimated: >60 mL/min (ref >60)
Glucose, Bld: 187 mg/dL — ABNORMAL HIGH (ref 70–99)
Potassium: 3.5 mmol/L (ref 3.5–5.1)
Sodium: 135 mmol/L (ref 135–145)

## 2021-06-06 LAB — CBC
HCT: 37.5 % (ref 36.0–46.0)
Hemoglobin: 12.8 g/dL (ref 12.0–15.0)
MCH: 30.8 pg (ref 26.0–34.0)
MCHC: 34.1 g/dL (ref 30.0–36.0)
MCV: 90.4 fL (ref 80.0–100.0)
Platelets: 195 10*3/uL (ref 150–400)
RBC: 4.15 MIL/uL (ref 3.87–5.11)
RDW: 14 % (ref 11.5–15.5)
WBC: 10.2 10*3/uL (ref 4.0–10.5)
nRBC: 0 % (ref 0.0–0.2)

## 2021-06-06 NOTE — Progress Notes (Signed)
3 Days Post-Op   Subjective/Chief Complaint: Feels better but still feels a little bloated.   Objective: Vital signs in last 24 hours: Temp:  [98 F (36.7 C)-98.8 F (37.1 C)] 98.5 F (36.9 C) (09/24 0747) Pulse Rate:  [60-71] 61 (09/24 0747) Resp:  [15-18] 15 (09/24 0747) BP: (134-160)/(52-85) 145/85 (09/24 0747) SpO2:  [96 %-99 %] 98 % (09/24 0747) Weight:  [86.8 kg] 86.8 kg (09/24 0458) Last BM Date: 06/06/21  Intake/Output from previous day: 09/23 0701 - 09/24 0700 In: 800 [P.O.:800] Out: 2100 [Urine:2100] Intake/Output this shift: No intake/output data recorded.  General appearance: alert and cooperative Resp: clear to auscultation bilaterally Cardio: regular rate and rhythm GI: soft, mild tenderness. Incisions look good. Good bs  Lab Results:  Recent Labs    06/05/21 0413 06/06/21 0405  WBC 12.6* 10.2  HGB 13.7 12.8  HCT 40.4 37.5  PLT 216 195   BMET Recent Labs    06/05/21 0413 06/06/21 0405  NA 142 135  K 3.7 3.5  CL 105 100  CO2 27 26  GLUCOSE 187* 187*  BUN 11 13  CREATININE 0.85 0.99  CALCIUM 9.6 8.7*   PT/INR No results for input(s): LABPROT, INR in the last 72 hours. ABG No results for input(s): PHART, HCO3 in the last 72 hours.  Invalid input(s): PCO2, PO2  Studies/Results: No results found.  Anti-infectives: Anti-infectives (From admission, onward)    Start     Dose/Rate Route Frequency Ordered Stop   06/03/21 1400  neomycin (MYCIFRADIN) tablet 1,000 mg  Status:  Discontinued       See Hyperspace for full Linked Orders Report.   1,000 mg Oral 3 times per day 06/03/21 1011 06/03/21 1015   06/03/21 1400  metroNIDAZOLE (FLAGYL) tablet 1,000 mg  Status:  Discontinued       See Hyperspace for full Linked Orders Report.   1,000 mg Oral 3 times per day 06/03/21 1011 06/03/21 1015   06/03/21 1015  cefoTEtan (CEFOTAN) 2 g in sodium chloride 0.9 % 100 mL IVPB        2 g 200 mL/hr over 30 Minutes Intravenous On call to O.R. 06/03/21  1011 06/03/21 1228       Assessment/Plan: s/p Procedure(s): ROBOTIC LOW ANTERIOR RESECTION AND  TAKEDOWN OF COLOGAVINAL FISTULA/POSTERIOR VAGINORRHAPHY WITH BILATERAL TAP BLOCK, INTRAOPERATIVE ASSESSMENT OF PERFUSION USING FIREFLY. PRIMARY REPAIR OF UMBILICAL HERNIA (N/A) FLEXIBLE SIGMOIDOSCOPY (N/A) CYSTOSCOPY with FIREFLY INJECTION AND BILATERAL RETROGRADES, BILATERAL OPEN ENDED CATHETER PLACEMENT (N/A) Advance diet Ambulate Plan for d/c tomorrow  LOS: 3 days    Autumn Messing III 06/06/2021

## 2021-06-07 NOTE — Plan of Care (Signed)
  Problem: Education: Goal: Required Educational Video(s) 06/07/2021 1358 by Tanda Rockers, RN Outcome: Adequate for Discharge 06/07/2021 1357 by Tanda Rockers, RN Outcome: Progressing   Problem: Clinical Measurements: Goal: Ability to maintain clinical measurements within normal limits will improve 06/07/2021 1358 by Tanda Rockers, RN Outcome: Adequate for Discharge 06/07/2021 1357 by Tanda Rockers, RN Outcome: Progressing Goal: Postoperative complications will be avoided or minimized 06/07/2021 1358 by Tanda Rockers, RN Outcome: Adequate for Discharge 06/07/2021 1357 by Tanda Rockers, RN Outcome: Progressing   Problem: Skin Integrity: Goal: Demonstration of wound healing without infection will improve 06/07/2021 1358 by Tanda Rockers, RN Outcome: Adequate for Discharge 06/07/2021 1357 by Tanda Rockers, RN Outcome: Progressing

## 2021-06-07 NOTE — Progress Notes (Signed)
Assessment unchanged. Pt verbalized understanding of dc instructions through teach back including medications to take and those to resume as well as follow up care. Discharged via foot per pt request to front entrance accompanied by NT.

## 2021-06-07 NOTE — Progress Notes (Signed)
4 Days Post-Op   Subjective/Chief Complaint: Feels better. Ready to go home   Objective: Vital signs in last 24 hours: Temp:  [98.4 F (36.9 C)-98.9 F (37.2 C)] 98.7 F (37.1 C) (09/25 0554) Pulse Rate:  [57-61] 57 (09/25 0554) Resp:  [16-18] 16 (09/25 0554) BP: (158-162)/(65-72) 162/72 (09/25 0554) SpO2:  [96 %-98 %] 97 % (09/25 0554) Last BM Date: 06/06/21  Intake/Output from previous day: 09/24 0701 - 09/25 0700 In: 960 [P.O.:960] Out: 2100 [Urine:2100] Intake/Output this shift: No intake/output data recorded.  General appearance: alert and cooperative Resp: clear to auscultation bilaterally Cardio: regular rate and rhythm GI: soft, minimal tenderness. Incisions look good  Lab Results:  Recent Labs    06/05/21 0413 06/06/21 0405  WBC 12.6* 10.2  HGB 13.7 12.8  HCT 40.4 37.5  PLT 216 195   BMET Recent Labs    06/05/21 0413 06/06/21 0405  NA 142 135  K 3.7 3.5  CL 105 100  CO2 27 26  GLUCOSE 187* 187*  BUN 11 13  CREATININE 0.85 0.99  CALCIUM 9.6 8.7*   PT/INR No results for input(s): LABPROT, INR in the last 72 hours. ABG No results for input(s): PHART, HCO3 in the last 72 hours.  Invalid input(s): PCO2, PO2  Studies/Results: No results found.  Anti-infectives: Anti-infectives (From admission, onward)    Start     Dose/Rate Route Frequency Ordered Stop   06/03/21 1400  neomycin (MYCIFRADIN) tablet 1,000 mg  Status:  Discontinued       See Hyperspace for full Linked Orders Report.   1,000 mg Oral 3 times per day 06/03/21 1011 06/03/21 1015   06/03/21 1400  metroNIDAZOLE (FLAGYL) tablet 1,000 mg  Status:  Discontinued       See Hyperspace for full Linked Orders Report.   1,000 mg Oral 3 times per day 06/03/21 1011 06/03/21 1015   06/03/21 1015  cefoTEtan (CEFOTAN) 2 g in sodium chloride 0.9 % 100 mL IVPB        2 g 200 mL/hr over 30 Minutes Intravenous On call to O.R. 06/03/21 1011 06/03/21 1228       Assessment/Plan: s/p  Procedure(s): ROBOTIC LOW ANTERIOR RESECTION AND  TAKEDOWN OF COLOGAVINAL FISTULA/POSTERIOR VAGINORRHAPHY WITH BILATERAL TAP BLOCK, INTRAOPERATIVE ASSESSMENT OF PERFUSION USING FIREFLY. PRIMARY REPAIR OF UMBILICAL HERNIA (N/A) FLEXIBLE SIGMOIDOSCOPY (N/A) CYSTOSCOPY with FIREFLY INJECTION AND BILATERAL RETROGRADES, BILATERAL OPEN ENDED CATHETER PLACEMENT (N/A) Advance diet Discharge  LOS: 4 days    Autumn Messing III 06/07/2021

## 2021-06-08 ENCOUNTER — Other Ambulatory Visit (HOSPITAL_COMMUNITY): Payer: BC Managed Care – PPO

## 2021-06-11 NOTE — Discharge Summary (Signed)
Patient ID: Sophia Young [id number] 10-10-1955 65 y.o.  Admit date: 06/03/2021 Discharge date: 06/11/2021  Admitting Diagnosis: Colovaginal fistula Hx of L ureteral issues dating back at least 51 years; previously has had double J stent in place  Discharge Diagnosis S/p Robotic low anterior resection with takedown of colovaginal fistula  Consultants Urology   Reason for Admission: Ms. Sophia Young is a very pleasant 65yoF hx HTN, DM (on PO hypoglycemics), HLD presents to our office or evaluation of a possible colovaginal fistula.  She has been followed with Dr. Noah Delaine for a left ureteral UPJ obstruction.  She has had recurrent urinary tract infections.  She had a ureteral stent in place because of double-J stent for approximately 3 weeks.  This was removed.  She had interval CT scans completed while the stent was in place that demonstrated the left ureter to be away from her sigmoid colon and associated prior inflammatory areas.  For proximally 7 months she has struggled with stool and gas passing from her vagina.  She otherwise has good control from a anorectal perspective and even if having loose stool can use the restroom when convenient without having any accidents.  Because of the stool leaking from the vagina she will wear a pad.  She has also noticed flatus passing through this route as well.  This has caused significant disturbances in her quality of life and she is currently not working.   She had vaginoscopy completed with Dr. Noah Delaine as well as demonstrates feculent material within the vaginal vault.   She reports that she does carry a diagnosis of having had diverticulitis before.  She reports 2 separate attacks of pain and being told that she had some inflammation in her colon.  She was treated antibiotics and had resolution.  She denies any history of percutaneous drain placements or known abscesses.   She also has a history of issues with her left ureter dating back to at  least 2011 and has had recurrent UTIs that was felt to be related to a possible intrinsic stenosis of her left ureter that again well away from her sigmoid colon on recent imaging.   Colonoscopy with Dr. Gala Romney 05/2017 showed a sectional area polyp that was removed from the ascending colon.  Diverticulosis and the entire colon.  Congested mucosa and distal descending query focal diverticulitis.  Biopsied.  The segment returned with inflammation and reactive changes without adenomatous change.  The ascending colon polyp was a tubular adenoma.   She denies any changes in her health or health history - tolerated her bowel prep with satisfactory result, including antibiotics. States she is ready for surgery  Procedures Dr. Nadeen Landau - 06/03/2021 1.  Robotic low anterior resection with takedown of colovaginal fistula 2.  Repair of vaginal cuff 3.  Incarcerated omental containing umbilical hernia repair, primary (no mesh) 4.  Intraoperative assessment of perfusion 5.  Flexible sigmoidoscopy 6.  Bilateral transversus abdominis plane blocks  Dr. Nicki Reaper MacDiarmid - 06/03/2021 Cystoscopy left retrograde ureterogram's with firefly and insertion of ureteral catheters and Foley catheter  Hospital Course:  Patient was admitted as above for planned surgery. She underwent above procedure by Dr. Dema Severin. Patient tolerated the procedure well. Her diet was advanced and tolerated. On 9/25 she was felt stable for discharge home with follow up as noted below.   I was not directly involved in this patient's care and did not see the patient during their hospital stay, therefore the information in this discharge summary was  taken entirely from the chart.  Allergies as of 06/07/2021   No Known Allergies      Medication List     TAKE these medications    aspirin 81 MG chewable tablet Chew 81 mg by mouth daily.   Centrum Silver 50+Women Tabs Take 1 tablet by mouth 4 (four) times a week.   cholecalciferol  25 MCG (1000 UNIT) tablet Commonly known as: VITAMIN D3 Take 1,000 Units by mouth every 14 (fourteen) days.   docusate sodium 100 MG capsule Commonly known as: COLACE Take 100 mg by mouth daily.   levothyroxine 75 MCG tablet Commonly known as: SYNTHROID Take 75 mcg by mouth daily before breakfast.   losartan 25 MG tablet Commonly known as: COZAAR Take 50 mg by mouth daily.   pravastatin 40 MG tablet Commonly known as: PRAVACHOL Take 40 mg by mouth daily.   triamterene-hydrochlorothiazide 37.5-25 MG capsule Commonly known as: DYAZIDE Take 1 capsule by mouth daily.   Xigduo XR 06-999 MG Tb24 Generic drug: Dapagliflozin-metFORMIN HCl ER Take 1 tablet by mouth daily.       ASK your doctor about these medications    traMADol 50 MG tablet Commonly known as: Ultram Take 1 tablet (50 mg total) by mouth every 6 (six) hours as needed for up to 5 days. Ask about: Should I take this medication?          Follow-up Information     Ileana Roup, MD Follow up in 2 week(s).   Specialties: General Surgery, Colon and Rectal Surgery Contact information: [address] [id number] [phone]                 Signed: Alferd Apa, [facility] 06/11/2021, 12:05 PM Please see Amion for pager number during day hours 7:00am-4:30pm

## 2021-06-15 DIAGNOSIS — E782 Mixed hyperlipidemia: Secondary | ICD-10-CM | POA: Diagnosis not present

## 2021-06-15 DIAGNOSIS — E1165 Type 2 diabetes mellitus with hyperglycemia: Secondary | ICD-10-CM | POA: Diagnosis not present

## 2021-06-15 DIAGNOSIS — Z0000 Encounter for general adult medical examination without abnormal findings: Secondary | ICD-10-CM | POA: Diagnosis not present

## 2021-06-15 DIAGNOSIS — E039 Hypothyroidism, unspecified: Secondary | ICD-10-CM | POA: Diagnosis not present

## 2021-06-16 DIAGNOSIS — I10 Essential (primary) hypertension: Secondary | ICD-10-CM | POA: Diagnosis not present

## 2021-06-16 DIAGNOSIS — Z23 Encounter for immunization: Secondary | ICD-10-CM | POA: Diagnosis not present

## 2021-06-16 DIAGNOSIS — E782 Mixed hyperlipidemia: Secondary | ICD-10-CM | POA: Diagnosis not present

## 2021-06-16 DIAGNOSIS — E1169 Type 2 diabetes mellitus with other specified complication: Secondary | ICD-10-CM | POA: Diagnosis not present

## 2021-06-16 DIAGNOSIS — E039 Hypothyroidism, unspecified: Secondary | ICD-10-CM | POA: Diagnosis not present

## 2021-06-19 ENCOUNTER — Telehealth: Payer: BC Managed Care – PPO | Admitting: Urology

## 2021-07-06 ENCOUNTER — Other Ambulatory Visit (HOSPITAL_COMMUNITY): Payer: Self-pay | Admitting: Internal Medicine

## 2021-07-06 DIAGNOSIS — Z1231 Encounter for screening mammogram for malignant neoplasm of breast: Secondary | ICD-10-CM

## 2021-07-14 ENCOUNTER — Encounter: Payer: Self-pay | Admitting: Urology

## 2021-07-14 ENCOUNTER — Ambulatory Visit (INDEPENDENT_AMBULATORY_CARE_PROVIDER_SITE_OTHER): Payer: BC Managed Care – PPO | Admitting: Urology

## 2021-07-14 ENCOUNTER — Other Ambulatory Visit: Payer: Self-pay

## 2021-07-14 DIAGNOSIS — Q6239 Other obstructive defects of renal pelvis and ureter: Secondary | ICD-10-CM

## 2021-07-14 NOTE — Progress Notes (Signed)
07/14/2021 1:19 PM   Sophia Young [birth year] [id number]  Referring provider: Celene Squibb, MD [address] [name],  [place] [address]  Patient location: home Physician location: office I connected with  Ashlay Altieri Rosenau on 07/14/21 by a video enabled telemedicine application and verified that I am speaking with the correct person using two identifiers.   I discussed the limitations of evaluation and management by telemedicine. The patient expressed understanding and agreed to proceed.    Followup left UPJ obstruction   HPI: Sophia Young is a 65yo here for followup for a left UPJ obstruction. Since last visit she underwent Colovaginal fistula repair. She is doing well with no urinary incontinence and no fecal incontinence. She denies any left flank pain. Renal US from 9/26 shows stable left renal fullness. She denies any LUTS. No other complaints today   PMH: Past Medical History:  Diagnosis Date   Chronic back pain    Complication of anesthesia    Diabetes mellitus without complication ([place])    History of kidney stones    Hydronephrosis of left kidney    chronic   Hypercholesteremia    Hypertension    PONV (postoperative nausea and vomiting)    Ureteral stricture, left     Surgical History: Past Surgical History:  Procedure Laterality Date   ABDOMINAL HYSTERECTOMY     BACK SURGERY     BIOPSY  06/01/2017   Procedure: BIOPSY;  Surgeon: Daneil Dolin, MD;  [facility];  Service: Endoscopy;;  colon   CHOLECYSTECTOMY     COLONOSCOPY N/A 06/01/2017   Procedure: COLONOSCOPY;  Surgeon: Daneil Dolin, MD;  [facility];  Service: Endoscopy;  Laterality: N/A;  7:30 AM   COLONOSCOPY WITH PROPOFOL N/A 02/27/2021   Procedure: COLONOSCOPY WITH PROPOFOL;  Surgeon: Eloise Harman, DO;  [facility];  Service: Endoscopy;  Laterality: N/A;  10:30am   CYSTOSCOPY W/ RETROGRADES Bilateral 11/06/2020   Procedure: CYSTOSCOPY WITH RETROGRADE  PYELOGRAM;  Surgeon: Cleon Gustin, MD;  Location: [facility];  Service: Urology;  Laterality: Bilateral;   CYSTOSCOPY W/ URETERAL STENT PLACEMENT Left 11/06/2020   Procedure: CYSTOSCOPY WITH STENT REPLACEMENT;  Surgeon: Cleon Gustin, MD;  Location: [facility];  Service: Urology;  Laterality: Left;   FLEXIBLE SIGMOIDOSCOPY N/A 06/03/2021   Procedure: FLEXIBLE SIGMOIDOSCOPY;  Surgeon: Ileana Roup, MD;  Location: [facility];  Service: General;  Laterality: N/A;   POLYPECTOMY  06/01/2017   Procedure: POLYPECTOMY;  Surgeon: Daneil Dolin, MD;  [facility];  Service: Endoscopy;;  colon    SP DIL URETER Left 2011   URETERAL STENT PLACEMENT Left 2011   URETEROSCOPY Left 11/06/2020   Procedure: URETEROSCOPY- diagnostic;  Surgeon: Cleon Gustin, MD;  Location: [facility];  Service: Urology;  Laterality: Left;    Home Medications:  Allergies as of 07/14/2021   No Known Allergies      Medication List        Accurate as of July 14, 2021  1:19 PM. If you have any questions, ask your nurse or doctor.          aspirin 81 MG chewable tablet Chew 81 mg by mouth daily.   Centrum Silver 50+Women Tabs Take 1 tablet by mouth 4 (four) times a week.   cholecalciferol 25 MCG (1000 UNIT) tablet Commonly known as: VITAMIN D3 Take 1,000 Units by mouth every 14 (fourteen) days.   docusate sodium 100 MG capsule  Commonly known as: COLACE Take 100 mg by mouth daily.   levothyroxine 75 MCG tablet Commonly known as: SYNTHROID Take 75 mcg by mouth daily before breakfast.   losartan 25 MG tablet Commonly known as: COZAAR Take 50 mg by mouth daily.   pravastatin 40 MG tablet Commonly known as: PRAVACHOL Take 40 mg by mouth daily.   triamterene-hydrochlorothiazide 37.5-25 MG capsule Commonly known as: DYAZIDE Take 1 capsule by mouth daily.   Xigduo XR 06-999 MG Tb24 Generic drug: Dapagliflozin-metFORMIN HCl ER Take 1 tablet by mouth daily.        Allergies: No  Known Allergies  Family History: Family History  Problem Relation Age of Onset   Prostate cancer Brother    Crohn's disease Cousin    Colon cancer Neg Hx     Social History:  reports that she has quit smoking. Her smoking use included cigarettes. She has a 1.50 pack-year smoking history. She has never used smokeless tobacco. She reports that she does not drink alcohol and does not use drugs.  ROS: All other review of systems were reviewed and are negative except what is noted above in HPI   Laboratory Data: Lab Results  Component Value Date   WBC 10.2 06/06/2021   HGB 12.8 06/06/2021   HCT 37.5 06/06/2021   MCV 90.4 06/06/2021   PLT 195 06/06/2021    Lab Results  Component Value Date   CREATININE 0.99 06/06/2021    No results found for: PSA  No results found for: TESTOSTERONE  Lab Results  Component Value Date   HGBA1C 7.4 (H) 04/06/2021    Urinalysis    Component Value Date/Time   COLORURINE BROWN (A) 10/26/2020 1622   APPEARANCEUR Hazy (A) 12/19/2020 1308   LABSPEC 1.023 10/26/2020 1622   PHURINE 6.0 10/26/2020 1622   GLUCOSEU 3+ (A) 12/19/2020 1308   HGBUR LARGE (A) 10/26/2020 1622   BILIRUBINUR Negative 12/19/2020 1308   KETONESUR NEGATIVE 10/26/2020 1622   PROTEINUR Trace (A) 12/19/2020 1308   PROTEINUR >=300 (A) 10/26/2020 1622   UROBILINOGEN 4.0 (H) 01/22/2010 0859   NITRITE Negative 12/19/2020 1308   NITRITE NEGATIVE 10/26/2020 1622   LEUKOCYTESUR 1+ (A) 12/19/2020 1308   LEUKOCYTESUR MODERATE (A) 10/26/2020 1622    Lab Results  Component Value Date   LABMICR See below: 12/19/2020   WBCUA >30 (A) 12/19/2020   LABEPIT 0-10 12/19/2020   MUCUS Present 12/19/2020   BACTERIA Moderate (A) 12/19/2020    Pertinent Imaging: Renal US 9/26: Images reviewed and discussed with the patient Results for orders placed during the hospital encounter of 02/10/10  DG Abd 1 View  Narrative Clinical Data: Left renal calculus  ABDOMEN - 1  VIEW  Comparison: None Correlation:  CT abdomen pelvis 01/22/2010  Findings: Surgical clip left pelvis. Elongated calcification left pelvis, corresponding to a vascular calcification seen on preceding CT. Facet degenerative changes lower lumbar spine. No definite urinary tract calcification identified. Bowel gas pattern normal. No acute bony findings.  IMPRESSION: No definite urinary tract calcification identified.  Provider: Jennye Boroughs  No results found for this or any previous visit.  No results found for this or any previous visit.  No results found for this or any previous visit.  Results for orders placed during the hospital encounter of 05/29/21  Ultrasound renal complete  Narrative CLINICAL DATA:  Left UPJ obstruction, congenital.  EXAM: RENAL / URINARY TRACT ULTRASOUND COMPLETE  COMPARISON:  Renal ultrasound 11/27/2020.  CT 11/17/2020  FINDINGS: Right Kidney:  Renal  measurements: 11.6 x 4.9 x 5.9 cm = volume: 178 mL. Slight prominence of the major calices but no renal pelvis dilatation or frank hydronephrosis. Normal parenchymal echogenicity. No focal lesion or stone.  Left Kidney:  Renal measurements: 14.4 x 6.7 x 7.1 cm = volume: 357 mL. Moderate hydronephrosis which is not significantly changed from prior exam. Areas of cortical thinning and increased parenchymal echogenicity. No visualized stone or focal lesion.  Bladder:  Appears normal for degree of bladder distention, only partially distended. Postvoid residual not obtained.  Other:  None.  IMPRESSION: 1. Moderate left hydronephrosis that is not significantly changed from prior exam. Mild thinning of the left renal parenchyma and increased echogenicity. 2. Slight fullness of the calices on the right without renal pelvis dilatation or frank hydronephrosis.   Electronically Signed By: Keith Rake M.D. On: 05/31/2021 16:53  No results found for this or any previous  visit.  No results found for this or any previous visit.  No results found for this or any previous visit.   Assessment & Plan:    1. Left UPJ obstruction -Her left hydronephrosis is stable and she is currently asymptomatic. I will see her back in 1 year with a renal US.   No follow-ups on file.  Nicolette Bang, MD  [facility] [place]

## 2021-07-14 NOTE — Patient Instructions (Signed)
Hydronephrosis ?Hydronephrosis is the swelling of one or both kidneys due to a blockage that stops urine from flowing out of the body. Kidneys filter waste from the blood and produce urine. This condition can lead to kidney failure and may become life-threatening if not treated promptly. ?What are the causes? ?In infants and children, common causes include problems that occur when a baby is developing in the womb. These can include problems in the kidneys or in the tubes that drain urine into the bladder (ureters). ?In adults, common causes include: ?Kidney stones. ?Pregnancy. ?A tumor or cyst in the abdomen or pelvis. ?An enlarged prostate gland. ?Other causes include: ?Bladder infection. ?Scar tissue from a previous surgery or injury. ?A blood clot. ?Cancer of the prostate, bladder, uterus, ovary, or colon. ?What are the signs or symptoms? ?Symptoms of this condition include: ?Pain or discomfort in your side (flank) or abdomen. ?Swelling in your abdomen. ?Nausea and vomiting. ?Fever. ?Pain when passing urine. ?Feelings of urgency when you need to urinate. ?Urinating more often than normal. ?In some cases, you may not have any symptoms. ?How is this diagnosed? ?This condition may be diagnosed based on: ?Your symptoms and medical history. ?A physical exam. ?Blood and urine tests. ?Imaging tests, such as an ultrasound, CT scan, or MRI. ?A procedure to look at your urinary tract and bladder by inserting a scope into the urethra (cystoscopy). ?How is this treated? ?Treatment for this condition depends on where the blockage is, how long it has been there, and what caused it. The goal of treatment is to remove the blockage. Treatment may include: ?Antibiotic medicines to treat or prevent infection. ?A procedure to place a small, thin tube (stent) into a blocked ureter. The stent will keep the ureter open so that urine can drain through it. ?A nonsurgical procedure that crushes kidney stones with shock waves  (extracorporeal shock wave lithotripsy). ?If kidney failure occurs, treatment may include dialysis or a kidney transplant. ?Follow these instructions at home: ? ?Take over-the-counter and prescription medicines only as told by your health care provider. ?If you were prescribed an antibiotic medicine, take it exactly as told by your health care provider. Do not stop taking the antibiotic even if you start to feel better. ?Rest and return to your normal activities as told by your health care provider. Ask your health care provider what activities are safe for you. ?Drink enough fluid to keep your urine pale yellow. ?Keep all follow-up visits. This is important. ?Contact a health care provider if: ?You continue to have symptoms after treatment. ?You develop new symptoms. ?Your urine becomes cloudy or bloody. ?You have a fever. ?Get help right away if: ?You have severe flank or abdominal pain. ?You cannot drink fluids without vomiting. ?Summary ?Hydronephrosis is the swelling of one or both kidneys due to a blockage that stops urine from flowing out of the body. ?Hydronephrosis can lead to kidney failure and may become life-threatening if not treated promptly. ?The goal of treatment is to remove the blockage. It may include a procedure to insert a stent into a blocked ureter, a procedure to break up kidney stones, or taking antibiotic medicines. ?Follow your health care provider's instructions for taking care of yourself at home, including instructions about drinking fluids, taking medicines, and limiting activities. ?This information is not intended to replace advice given to you by your health care provider. Make sure you discuss any questions you have with your health care provider. ?Document Revised: 12/18/2019 Document Reviewed: 12/18/2019 ?Elsevier Patient   Education ? 2022 [employer] ? ?

## 2021-07-15 ENCOUNTER — Ambulatory Visit (HOSPITAL_COMMUNITY)
Admission: RE | Admit: 2021-07-15 | Discharge: 2021-07-15 | Disposition: A | Discharge status: Home / Self Care | Payer: Medicare Other | Source: Ambulatory Visit | Attending: Internal Medicine | Admitting: Internal Medicine

## 2021-07-15 DIAGNOSIS — Z1231 Encounter for screening mammogram for malignant neoplasm of breast: Secondary | ICD-10-CM | POA: Diagnosis not present

## 2021-09-11 DIAGNOSIS — E559 Vitamin D deficiency, unspecified: Secondary | ICD-10-CM | POA: Diagnosis not present

## 2021-09-11 DIAGNOSIS — E1169 Type 2 diabetes mellitus with other specified complication: Secondary | ICD-10-CM | POA: Diagnosis not present

## 2021-09-11 DIAGNOSIS — E782 Mixed hyperlipidemia: Secondary | ICD-10-CM | POA: Diagnosis not present

## 2021-09-11 DIAGNOSIS — E039 Hypothyroidism, unspecified: Secondary | ICD-10-CM | POA: Diagnosis not present

## 2021-11-11 DIAGNOSIS — R809 Proteinuria, unspecified: Secondary | ICD-10-CM | POA: Diagnosis not present

## 2021-11-11 DIAGNOSIS — N823 Fistula of vagina to large intestine: Secondary | ICD-10-CM | POA: Diagnosis not present

## 2021-11-11 DIAGNOSIS — I10 Essential (primary) hypertension: Secondary | ICD-10-CM | POA: Diagnosis not present

## 2021-11-11 DIAGNOSIS — E559 Vitamin D deficiency, unspecified: Secondary | ICD-10-CM | POA: Diagnosis not present

## 2021-11-11 DIAGNOSIS — Z6834 Body mass index (BMI) 34.0-34.9, adult: Secondary | ICD-10-CM | POA: Diagnosis not present

## 2021-11-11 DIAGNOSIS — E039 Hypothyroidism, unspecified: Secondary | ICD-10-CM | POA: Diagnosis not present

## 2021-11-11 DIAGNOSIS — E782 Mixed hyperlipidemia: Secondary | ICD-10-CM | POA: Diagnosis not present

## 2021-11-11 DIAGNOSIS — E1169 Type 2 diabetes mellitus with other specified complication: Secondary | ICD-10-CM | POA: Diagnosis not present

## 2021-11-11 DIAGNOSIS — M545 Low back pain, unspecified: Secondary | ICD-10-CM | POA: Diagnosis not present

## 2021-11-11 DIAGNOSIS — N1330 Unspecified hydronephrosis: Secondary | ICD-10-CM | POA: Diagnosis not present

## 2021-11-11 DIAGNOSIS — F411 Generalized anxiety disorder: Secondary | ICD-10-CM | POA: Diagnosis not present

## 2021-11-12 ENCOUNTER — Encounter: Payer: Self-pay | Admitting: Nutrition

## 2021-11-12 ENCOUNTER — Encounter: Payer: Self-pay | Attending: Nurse Practitioner | Admitting: Nutrition

## 2021-11-12 ENCOUNTER — Other Ambulatory Visit: Payer: Self-pay

## 2021-11-12 VITALS — Ht 63.0 in | Wt 195.0 lb

## 2021-11-12 DIAGNOSIS — E118 Type 2 diabetes mellitus with unspecified complications: Secondary | ICD-10-CM | POA: Insufficient documentation

## 2021-11-12 DIAGNOSIS — E669 Obesity, unspecified: Secondary | ICD-10-CM | POA: Insufficient documentation

## 2021-11-12 NOTE — Patient Instructions (Signed)
Goals  Eat three meals per day Eat 30 g CHO at meals Follow the plate method Measure portions out Keep exercising Aim 100 oz of water per day.

## 2021-11-12 NOTE — Progress Notes (Unsigned)
Medical Nutrition Therapy  Appointment Start time:  0800  Appointment End time:  0900  Primary concerns today: DM Type 2  Referral diagnosis: E11.8 Preferred learning style: no preference Learning readiness: change in progress    NUTRITION ASSESSMENT  Just retired. Dx with Diabtes 7-10 yrs. FBS 200's    Testing twice a day; 120-200's  Bedtime: 150-170's. Been walking 3 miles 5 days a week. [place]  Anthropometrics  Wt Readings from Last 3 Encounters:  06/06/21 191 lb 5.8 oz (86.8 kg)  05/25/21 193 lb 6.4 oz (87.7 kg)  04/06/21 198 lb (89.8 kg)   Ht Readings from Last 3 Encounters:  06/03/21 5\' 3"  (1.6 m)  05/25/21 5\' 3"  (1.6 m)  04/06/21 5\' 3"  (1.6 m)   There is no height or weight on file to calculate BMI. @BMIFA @ Facility age limit for growth percentiles is 20 years. Facility age limit for growth percentiles is 20 years.    Clinical Medical Hx: *** Medications: *** Labs: *** Notable Signs/Symptoms: ***  Lifestyle & Dietary Hx Widow of 9 years. Helps take her aging mother.  Estimated daily fluid intake: 40+ oz Supplements: Vit D, MVI Sleep: 7-8 hrs  Stress / self-care: family issues Current average weekly physical activity: wakling 3 miles per day 5 days a week.  24-Hr Dietary Recall First Meal:  11 amGreen peppers with egg omelet, recently cut out adding toast, tangerine, 1 cup coffee-creamer zero sugar, water Snack:  Second Meal: skipped Snack:  Third Meal:  Chef salad with ranch,  unsweet tea or water Snack: 2 halos Beverages: water  Estimated Energy Needs Calories: *** Carbohydrate: ***g Protein: ***g Fat: ***g   NUTRITION DIAGNOSIS  {CHL AMB NUTRITIONAL [phone]}   NUTRITION INTERVENTION  Nutrition education (E-1) on the following topics:  ***  Handouts Provided Include  ***  Learning Style & Readiness for Change Teaching method utilized: Visual & Auditory  Demonstrated degree of understanding via: Teach Back  Barriers to  learning/adherence to lifestyle change: ***  Goals Established by Pt ***   MONITORING & EVALUATION Dietary intake, weekly physical activity, and *** in ***.  Next Steps  Patient is to ***.

## 2021-11-24 ENCOUNTER — Encounter: Payer: Self-pay | Admitting: Nutrition

## 2021-12-16 DIAGNOSIS — E1169 Type 2 diabetes mellitus with other specified complication: Secondary | ICD-10-CM | POA: Diagnosis not present

## 2021-12-16 DIAGNOSIS — Z6833 Body mass index (BMI) 33.0-33.9, adult: Secondary | ICD-10-CM | POA: Diagnosis not present

## 2021-12-17 ENCOUNTER — Ambulatory Visit: Payer: Self-pay | Admitting: Nutrition

## 2022-01-19 DIAGNOSIS — E782 Mixed hyperlipidemia: Secondary | ICD-10-CM | POA: Diagnosis not present

## 2022-01-19 DIAGNOSIS — E559 Vitamin D deficiency, unspecified: Secondary | ICD-10-CM | POA: Diagnosis not present

## 2022-01-19 DIAGNOSIS — E1169 Type 2 diabetes mellitus with other specified complication: Secondary | ICD-10-CM | POA: Diagnosis not present

## 2022-01-19 DIAGNOSIS — E039 Hypothyroidism, unspecified: Secondary | ICD-10-CM | POA: Diagnosis not present

## 2022-01-27 DIAGNOSIS — R809 Proteinuria, unspecified: Secondary | ICD-10-CM | POA: Diagnosis not present

## 2022-01-27 DIAGNOSIS — E039 Hypothyroidism, unspecified: Secondary | ICD-10-CM | POA: Diagnosis not present

## 2022-01-27 DIAGNOSIS — N823 Fistula of vagina to large intestine: Secondary | ICD-10-CM | POA: Diagnosis not present

## 2022-01-27 DIAGNOSIS — F411 Generalized anxiety disorder: Secondary | ICD-10-CM | POA: Diagnosis not present

## 2022-01-27 DIAGNOSIS — E782 Mixed hyperlipidemia: Secondary | ICD-10-CM | POA: Diagnosis not present

## 2022-01-27 DIAGNOSIS — Z6834 Body mass index (BMI) 34.0-34.9, adult: Secondary | ICD-10-CM | POA: Diagnosis not present

## 2022-01-27 DIAGNOSIS — N1330 Unspecified hydronephrosis: Secondary | ICD-10-CM | POA: Diagnosis not present

## 2022-01-27 DIAGNOSIS — E1169 Type 2 diabetes mellitus with other specified complication: Secondary | ICD-10-CM | POA: Diagnosis not present

## 2022-01-27 DIAGNOSIS — I10 Essential (primary) hypertension: Secondary | ICD-10-CM | POA: Diagnosis not present

## 2022-01-27 DIAGNOSIS — E559 Vitamin D deficiency, unspecified: Secondary | ICD-10-CM | POA: Diagnosis not present

## 2022-03-10 DIAGNOSIS — Z6832 Body mass index (BMI) 32.0-32.9, adult: Secondary | ICD-10-CM | POA: Diagnosis not present

## 2022-03-10 DIAGNOSIS — R809 Proteinuria, unspecified: Secondary | ICD-10-CM | POA: Diagnosis not present

## 2022-03-10 DIAGNOSIS — E1165 Type 2 diabetes mellitus with hyperglycemia: Secondary | ICD-10-CM | POA: Diagnosis not present

## 2022-03-10 DIAGNOSIS — E669 Obesity, unspecified: Secondary | ICD-10-CM | POA: Diagnosis not present

## 2022-04-28 DIAGNOSIS — E039 Hypothyroidism, unspecified: Secondary | ICD-10-CM | POA: Diagnosis not present

## 2022-04-28 DIAGNOSIS — E782 Mixed hyperlipidemia: Secondary | ICD-10-CM | POA: Diagnosis not present

## 2022-04-28 DIAGNOSIS — E1169 Type 2 diabetes mellitus with other specified complication: Secondary | ICD-10-CM | POA: Diagnosis not present

## 2022-04-28 DIAGNOSIS — E559 Vitamin D deficiency, unspecified: Secondary | ICD-10-CM | POA: Diagnosis not present

## 2022-05-05 DIAGNOSIS — F411 Generalized anxiety disorder: Secondary | ICD-10-CM | POA: Diagnosis not present

## 2022-05-05 DIAGNOSIS — E559 Vitamin D deficiency, unspecified: Secondary | ICD-10-CM | POA: Diagnosis not present

## 2022-05-05 DIAGNOSIS — Z6834 Body mass index (BMI) 34.0-34.9, adult: Secondary | ICD-10-CM | POA: Diagnosis not present

## 2022-05-05 DIAGNOSIS — E782 Mixed hyperlipidemia: Secondary | ICD-10-CM | POA: Diagnosis not present

## 2022-05-05 DIAGNOSIS — N1330 Unspecified hydronephrosis: Secondary | ICD-10-CM | POA: Diagnosis not present

## 2022-05-05 DIAGNOSIS — E039 Hypothyroidism, unspecified: Secondary | ICD-10-CM | POA: Diagnosis not present

## 2022-05-05 DIAGNOSIS — I10 Essential (primary) hypertension: Secondary | ICD-10-CM | POA: Diagnosis not present

## 2022-05-05 DIAGNOSIS — E1169 Type 2 diabetes mellitus with other specified complication: Secondary | ICD-10-CM | POA: Diagnosis not present

## 2022-05-05 DIAGNOSIS — R809 Proteinuria, unspecified: Secondary | ICD-10-CM | POA: Diagnosis not present

## 2022-05-05 DIAGNOSIS — N823 Fistula of vagina to large intestine: Secondary | ICD-10-CM | POA: Diagnosis not present

## 2022-07-07 ENCOUNTER — Ambulatory Visit (HOSPITAL_COMMUNITY)
Admission: RE | Admit: 2022-07-07 | Discharge: 2022-07-07 | Disposition: A | Discharge status: Home / Self Care | Payer: PPO | Source: Ambulatory Visit | Attending: Urology | Admitting: Urology

## 2022-07-07 DIAGNOSIS — N1330 Unspecified hydronephrosis: Secondary | ICD-10-CM | POA: Diagnosis not present

## 2022-07-07 DIAGNOSIS — Q6239 Other obstructive defects of renal pelvis and ureter: Secondary | ICD-10-CM | POA: Insufficient documentation

## 2022-07-14 ENCOUNTER — Ambulatory Visit (INDEPENDENT_AMBULATORY_CARE_PROVIDER_SITE_OTHER): Payer: PPO | Admitting: Urology

## 2022-07-14 ENCOUNTER — Encounter: Payer: Self-pay | Admitting: Urology

## 2022-07-14 VITALS — BP 144/83 | HR 70

## 2022-07-14 DIAGNOSIS — N1330 Unspecified hydronephrosis: Secondary | ICD-10-CM

## 2022-07-14 DIAGNOSIS — Q6239 Other obstructive defects of renal pelvis and ureter: Secondary | ICD-10-CM | POA: Diagnosis not present

## 2022-07-14 LAB — URINALYSIS, ROUTINE W REFLEX MICROSCOPIC
Bilirubin, UA: NEGATIVE
Ketones, UA: NEGATIVE
Nitrite, UA: NEGATIVE
Specific Gravity, UA: 1.015 (ref 1.005–1.030)
Urobilinogen, Ur: 0.2 mg/dL (ref 0.2–1.0)
pH, UA: 5.5 (ref 5.0–7.5)

## 2022-07-14 LAB — MICROSCOPIC EXAMINATION
RBC, Urine: NONE SEEN /hpf (ref 0–2)
WBC, UA: >30 /hpf — ABNORMAL HIGH (ref 0–5)

## 2022-07-14 NOTE — Patient Instructions (Signed)
Hydronephrosis  Hydronephrosis is the swelling of one or both kidneys due to a blockage that stops urine from flowing out of the body. Kidneys filter waste from the blood and produce urine. This condition can lead to kidney failure and may become life-threatening if not treated promptly. What are the causes? In infants and children, common causes include problems that occur when a baby is developing in the womb. These can include problems in the kidneys or in the tubes that drain urine into the bladder (ureters). In adults, common causes include: Kidney stones. Pregnancy. A tumor or cyst in the abdomen or pelvis. An enlarged prostate gland. Other causes include: Bladder infection. Scar tissue from a previous surgery or injury. A blood clot. Cancer of the prostate, bladder, uterus, ovary, or colon. What are the signs or symptoms? Symptoms of this condition include: Pain or discomfort in your side (flank) or abdomen. Swelling in your abdomen. Nausea and vomiting. Fever. Pain when passing urine. Feelings of urgency when you need to urinate. Urinating more often than normal. In some cases, you may not have any symptoms. How is this diagnosed? This condition may be diagnosed based on: Your symptoms and medical history. A physical exam. Blood and urine tests. Imaging tests, such as an ultrasound, CT scan, or MRI. A procedure to look at your urinary tract and bladder by inserting a scope into the urethra (cystoscopy). How is this treated? Treatment for this condition depends on where the blockage is, how long it has been there, and what caused it. The goal of treatment is to remove the blockage. Treatment may include: Antibiotic medicines to treat or prevent infection. A procedure to place a small, thin tube (stent) into a blocked ureter. The stent will keep the ureter open so that urine can drain through it. A nonsurgical procedure that crushes kidney stones with shock waves  (extracorporeal shock wave lithotripsy). If kidney failure occurs, treatment may include dialysis or a kidney transplant. Follow these instructions at home:  Take over-the-counter and prescription medicines only as told by your health care provider. If you were prescribed an antibiotic medicine, take it exactly as told by your health care provider. Do not stop taking the antibiotic even if you start to feel better. Rest and return to your normal activities as told by your health care provider. Ask your health care provider what activities are safe for you. Drink enough fluid to keep your urine pale yellow. Keep all follow-up visits. This is important. Contact a health care provider if: You continue to have symptoms after treatment. You develop new symptoms. Your urine becomes cloudy or bloody. You have a fever. Get help right away if: You have severe flank or abdominal pain. You cannot drink fluids without vomiting. Summary Hydronephrosis is the swelling of one or both kidneys due to a blockage that stops urine from flowing out of the body. Hydronephrosis can lead to kidney failure and may become life-threatening if not treated promptly. The goal of treatment is to remove the blockage. It may include a procedure to insert a stent into a blocked ureter, a procedure to break up kidney stones, or taking antibiotic medicines. Follow your health care provider's instructions for taking care of yourself at home, including instructions about drinking fluids, taking medicines, and limiting activities. This information is not intended to replace advice given to you by your health care provider. Make sure you discuss any questions you have with your health care provider. Document Revised: 12/18/2019 Document Reviewed: 12/18/2019 Elsevier   Patient Education  2023 Elsevier Inc.  

## 2022-07-14 NOTE — Progress Notes (Signed)
07/14/2022 9:08 AM   Sophia Young 1956-04-14 [id number]  Referring provider: Celene Squibb, MD [address],  [place] [address]  Followup UPJ obstruction   HPI: Sophia Young is a [phone] here for followup for left UPJ obstruction. She denies any flank pain. No UTIs since last visit. Renal US 07/07/2022 shows worsening left hydronephrosis. No other complaints today   PMH: Past Medical History:  Diagnosis Date   Chronic back pain    Complication of anesthesia    Diabetes mellitus without complication ([place])    History of kidney stones    Hydronephrosis of left kidney    chronic   Hypercholesteremia    Hypertension    PONV (postoperative nausea and vomiting)    Ureteral stricture, left     Surgical History: Past Surgical History:  Procedure Laterality Date   ABDOMINAL HYSTERECTOMY     BACK SURGERY     BIOPSY  06/01/2017   Procedure: BIOPSY;  Surgeon: Daneil Dolin, MD;  [facility];  Service: Endoscopy;;  colon   CHOLECYSTECTOMY     COLONOSCOPY N/A 06/01/2017   Procedure: COLONOSCOPY;  Surgeon: Daneil Dolin, MD;  [facility]: [facility];  Service: Endoscopy;  Laterality: N/A;  7:30 AM   COLONOSCOPY WITH PROPOFOL N/A 02/27/2021   Procedure: COLONOSCOPY WITH PROPOFOL;  Surgeon: Eloise Harman, DO;  [facility]: [facility];  Service: Endoscopy;  Laterality: N/A;  10:30am   CYSTOSCOPY W/ RETROGRADES Bilateral 11/06/2020   Procedure: CYSTOSCOPY WITH RETROGRADE PYELOGRAM;  Surgeon: Cleon Gustin, MD;  Location: [facility];  Service: Urology;  Laterality: Bilateral;   CYSTOSCOPY W/ URETERAL STENT PLACEMENT Left 11/06/2020   Procedure: CYSTOSCOPY WITH STENT REPLACEMENT;  Surgeon: Cleon Gustin, MD;  Location: [facility];  Service: Urology;  Laterality: Left;   FLEXIBLE SIGMOIDOSCOPY N/A 06/03/2021   Procedure: FLEXIBLE SIGMOIDOSCOPY;  Surgeon: Ileana Roup, MD;  Location: [facility];  Service: General;  Laterality: N/A;   POLYPECTOMY  06/01/2017    Procedure: POLYPECTOMY;  Surgeon: Daneil Dolin, MD;  [facility];  Service: Endoscopy;;  colon    SP DIL URETER Left 2011   URETERAL STENT PLACEMENT Left 2011   URETEROSCOPY Left 11/06/2020   Procedure: URETEROSCOPY- diagnostic;  Surgeon: Cleon Gustin, MD;  Location: [facility];  Service: Urology;  Laterality: Left;    Home Medications:  Allergies as of 07/14/2022   No Known Allergies      Medication List        Accurate as of July 14, 2022  9:08 AM. If you have any questions, ask your nurse or doctor.          aspirin 81 MG chewable tablet Chew 81 mg by mouth daily.   Centrum Silver 50+Women Tabs Take 1 tablet by mouth 4 (four) times a week.   cholecalciferol 25 MCG (1000 UNIT) tablet Commonly known as: VITAMIN D3 Take 1,000 Units by mouth every 14 (fourteen) days.   docusate sodium 100 MG capsule Commonly known as: COLACE Take 100 mg by mouth daily.   levothyroxine 75 MCG tablet Commonly known as: SYNTHROID Take 75 mcg by mouth daily before breakfast.   losartan 25 MG tablet Commonly known as: COZAAR Take 50 mg by mouth daily.   pravastatin 40 MG tablet Commonly known as: PRAVACHOL Take 40 mg by mouth daily.   triamterene-hydrochlorothiazide 37.5-25 MG capsule Commonly known as: DYAZIDE Take 1 capsule by mouth daily.   Xigduo XR 06-999  MG Tb24 Generic drug: Dapagliflozin Pro-metFORMIN ER Take 1 tablet by mouth daily.        Allergies: No Known Allergies  Family History: Family History  Problem Relation Age of Onset   Prostate cancer Brother    Crohn's disease Cousin    Colon cancer Neg Hx     Social History:  reports that she has quit smoking. Her smoking use included cigarettes. She has a 1.50 pack-year smoking history. She has never used smokeless tobacco. She reports that she does not drink alcohol and does not use drugs.  ROS: All other review of systems were reviewed and are negative except what is noted above in  HPI  Physical Exam: BP (!) 144/83   Pulse 70   Constitutional:  Alert and oriented, No acute distress. HEENT: [place] AT, moist mucus membranes.  Trachea midline, no masses. Cardiovascular: No clubbing, cyanosis, or edema. Respiratory: Normal respiratory effort, no increased work of breathing. GI: Abdomen is soft, nontender, nondistended, no abdominal masses GU: No CVA tenderness.  Lymph: No cervical or inguinal lymphadenopathy. Skin: No rashes, bruises or suspicious lesions. Neurologic: Grossly intact, no focal deficits, moving all 4 extremities. Psychiatric: Normal mood and affect.  Laboratory Data: Lab Results  Component Value Date   WBC 10.2 06/06/2021   HGB 12.8 06/06/2021   HCT 37.5 06/06/2021   MCV 90.4 06/06/2021   PLT 195 06/06/2021    Lab Results  Component Value Date   CREATININE 0.99 06/06/2021    No results found for: "PSA"  No results found for: "TESTOSTERONE"  Lab Results  Component Value Date   HGBA1C 7.4 (H) 04/06/2021    Urinalysis    Component Value Date/Time   COLORURINE BROWN (A) 10/26/2020 1622   APPEARANCEUR Hazy (A) 12/19/2020 1308   LABSPEC 1.023 10/26/2020 1622   PHURINE 6.0 10/26/2020 1622   GLUCOSEU 3+ (A) 12/19/2020 1308   HGBUR LARGE (A) 10/26/2020 1622   BILIRUBINUR Negative 12/19/2020 1308   KETONESUR NEGATIVE 10/26/2020 1622   PROTEINUR Trace (A) 12/19/2020 1308   PROTEINUR >=300 (A) 10/26/2020 1622   UROBILINOGEN 4.0 (H) 01/22/2010 0859   NITRITE Negative 12/19/2020 1308   NITRITE NEGATIVE 10/26/2020 1622   LEUKOCYTESUR 1+ (A) 12/19/2020 1308   LEUKOCYTESUR MODERATE (A) 10/26/2020 1622    Lab Results  Component Value Date   LABMICR See below: 12/19/2020   WBCUA >30 (A) 12/19/2020   LABEPIT 0-10 12/19/2020   MUCUS Present 12/19/2020   BACTERIA Moderate (A) 12/19/2020    Pertinent Imaging: Renal US 07/07/2022: Images reviewed and discussed with the patient  Results for orders placed during the hospital encounter of  02/10/10  DG Abd 1 View  Narrative Clinical Data: Left renal calculus  ABDOMEN - 1 VIEW  Comparison: None Correlation:  CT abdomen pelvis 01/22/2010  Findings: Surgical clip left pelvis. Elongated calcification left pelvis, corresponding to a vascular calcification seen on preceding CT. Facet degenerative changes lower lumbar spine. No definite urinary tract calcification identified. Bowel gas pattern normal. No acute bony findings.  IMPRESSION: No definite urinary tract calcification identified.  Provider: Jennye Boroughs  No results found for this or any previous visit.  No results found for this or any previous visit.  No results found for this or any previous visit.  Results for orders placed during the hospital encounter of 07/07/22  US RENAL  Narrative CLINICAL DATA:  Congenital LEFT UPJ obstruction  EXAM: RENAL / URINARY TRACT ULTRASOUND COMPLETE  COMPARISON:  05/29/2021  FINDINGS: Right Kidney:  Renal measurements: 11.3 x 5.1 x 5.6 cm = volume: 168 mL. Normal cortical thickness and echogenicity. No mass, hydronephrosis, or shadowing calcification  Left Kidney:  Renal measurements: 14.2 x 6.6 x 5.8 cm = volume: 284 ML. Cortical thinning. Upper normal cortical echogenicity. Severe hydronephrosis with associated cortical thinning. No mass or shadowing calcification. Persistence of LEFT hydronephrosis after voiding.  Bladder:  RIGHT ureteral jet visualized. Bladder otherwise unremarkable. No wall thickening or mass seen.  Other:  None.  IMPRESSION: Severe LEFT hydronephrosis and associated LEFT renal cortical thinning, slightly increased from previous study.  Remainder of exam unremarkable.   Electronically Signed By: Lavonia Dana M.D. On: 07/08/2022 18:36  No valid procedures specified. No results found for this or any previous visit.  No results found for this or any previous visit.   Assessment & Plan:    1. UPJ obstruction,  congenital Lasix renogram - Urinalysis, Routine w reflex microscopic    No follow-ups on file.  Nicolette Bang, MD  [facility] [place]

## 2022-07-17 LAB — URINE CULTURE

## 2022-07-19 ENCOUNTER — Telehealth: Payer: Self-pay

## 2022-07-19 MED ORDER — SULFAMETHOXAZOLE-TRIMETHOPRIM 800-160 MG PO TABS: 1.0000 | ORAL_TABLET | Freq: Two times a day (BID) | ORAL | 0 refills | Status: DC

## 2022-07-19 NOTE — Telephone Encounter (Signed)
-----   Message from Cleon Gustin, MD sent at 07/19/2022  9:25 AM EST ----- Bactrim DS BID for 7 days ----- Message ----- From: Iris Pert, LPN Sent: [id number]/10/4297   8:58 AM EST To: Cleon Gustin, MD  Please review No txt started

## 2022-07-19 NOTE — Telephone Encounter (Signed)
Patient called and made aware positive urine culture and antibiotic sent to pharmacy.

## 2022-07-21 ENCOUNTER — Encounter (HOSPITAL_COMMUNITY)
Admission: RE | Admit: 2022-07-21 | Discharge: 2022-07-21 | Disposition: A | Discharge status: Home / Self Care | Payer: PPO | Source: Ambulatory Visit | Attending: Urology | Admitting: Urology

## 2022-07-21 DIAGNOSIS — N1330 Unspecified hydronephrosis: Secondary | ICD-10-CM | POA: Insufficient documentation

## 2022-07-21 MED ORDER — FUROSEMIDE 10 MG/ML IJ SOLN
INTRAMUSCULAR | Status: AC
  Administered 2022-07-21: 44 mg via INTRAVENOUS
  Filled 2022-07-21: qty 6

## 2022-07-21 MED ORDER — TECHNETIUM TC 99M MERTIATIDE
5.2500 | Freq: Once | INTRAVENOUS | Status: AC | PRN
  Administered 2022-07-21: 5.25 via INTRAVENOUS

## 2022-07-29 ENCOUNTER — Other Ambulatory Visit (HOSPITAL_COMMUNITY): Payer: Self-pay | Admitting: Internal Medicine

## 2022-07-29 DIAGNOSIS — Z1231 Encounter for screening mammogram for malignant neoplasm of breast: Secondary | ICD-10-CM

## 2022-08-04 ENCOUNTER — Ambulatory Visit (HOSPITAL_COMMUNITY)
Admission: RE | Admit: 2022-08-04 | Discharge: 2022-08-04 | Disposition: A | Discharge status: Home / Self Care | Payer: PPO | Source: Ambulatory Visit | Attending: Internal Medicine | Admitting: Internal Medicine

## 2022-08-04 DIAGNOSIS — Z1231 Encounter for screening mammogram for malignant neoplasm of breast: Secondary | ICD-10-CM | POA: Insufficient documentation

## 2022-08-09 ENCOUNTER — Ambulatory Visit (INDEPENDENT_AMBULATORY_CARE_PROVIDER_SITE_OTHER): Payer: PPO | Admitting: Urology

## 2022-08-09 ENCOUNTER — Encounter: Payer: Self-pay | Admitting: Urology

## 2022-08-09 VITALS — BP 153/84 | HR 74

## 2022-08-09 DIAGNOSIS — N3000 Acute cystitis without hematuria: Secondary | ICD-10-CM

## 2022-08-09 DIAGNOSIS — Q6239 Other obstructive defects of renal pelvis and ureter: Secondary | ICD-10-CM

## 2022-08-09 DIAGNOSIS — N390 Urinary tract infection, site not specified: Secondary | ICD-10-CM

## 2022-08-09 LAB — URINALYSIS, ROUTINE W REFLEX MICROSCOPIC
Bilirubin, UA: NEGATIVE
Ketones, UA: NEGATIVE
Nitrite, UA: POSITIVE — AB
Specific Gravity, UA: 1.01 (ref 1.005–1.030)
Urobilinogen, Ur: 0.2 mg/dL (ref 0.2–1.0)
pH, UA: 5.5 (ref 5.0–7.5)

## 2022-08-09 LAB — MICROSCOPIC EXAMINATION: WBC, UA: >30 /hpf — AB (ref 0–5)

## 2022-08-09 MED ORDER — SULFAMETHOXAZOLE-TRIMETHOPRIM 800-160 MG PO TABS: 1.0000 | ORAL_TABLET | Freq: Two times a day (BID) | ORAL | 0 refills | Status: DC

## 2022-08-09 NOTE — Patient Instructions (Signed)
Robot-Assisted Ureterolysis and Pyeloplasty  Robotic-assisted ureterolysis and pyeloplasty are two procedures to relieve a blockage in the ureter or the ureteropelvic junction (UPJ). Ureters are tubes that connect the organs that make urine (kidneys) to the organ that stores urine (bladder). The UPJ is the connection between the ureter and part of the kidney (renal pelvis). During these procedures, a computer is used to control surgical instruments that are attached to robotic arms. Ureterolysis may be done when a ureter is blocked by scar tissue pushing against the ureter. Scar tissue may be the result of previous surgery, infections, or inflammation. During ureterolysis, the ureter is moved away from the scar tissue to open the ureter, but the scar tissue is not removed. Pyeloplasty may be done when the ureter is blocked or constricted by something inside of the UPJ. The blockage may be caused by scar tissue, a fluid-filled sac (cyst), a growth, or a kidney stone. The blockage may also be caused by a blood vessel crossing in front of the UPJ. During pyeloplasty, the blockage is removed, unless the blockage is caused by a blood vessel. If the blockage is caused by a blood vessel, the blood vessel is moved away from the UPJ. Tell a health care provider about: Any allergies you have. All medicines you are taking, including vitamins, herbs, eye drops, creams, and over-the-counter medicines. Any problems you or family members have had with anesthetic medicines. Any blood disorders you have. Any surgeries you have had. Any medical conditions you have. Whether you are pregnant or may be pregnant. What are the risks? Generally, this is a safe procedure. However, problems may occur, including: Infection. Bleeding. Allergic reactions to medicines. Damage to other structures or organs, such as the ureter or the intestines. A return of the urine blockage. Urine leakage. Blood clots. The need to switch to  an open surgery. An open surgery means that the surgeon will make a larger incision and perform the procedure by hand. Your surgeon will decide if this is necessary during the procedure. What happens before the procedure? Staying hydrated Follow instructions from your health care provider about hydration, which may include: Up to 2 hours before the procedure - you may continue to drink clear liquids, such as water, clear fruit juice, black coffee, and plain tea.  Eating and drinking restrictions Follow instructions from your health care provider about eating and drinking, which may include: 8 hours before the procedure - stop eating heavy meals or foods, such as meat, fried foods, or fatty foods. 6 hours before the procedure - stop eating light meals or foods, such as toast or cereal. 6 hours before the procedure - stop drinking milk or drinks that contain milk. 2 hours before the procedure - stop drinking clear liquids. Medicines Ask your health care provider about: Changing or stopping your regular medicines. This is especially important if you are taking diabetes medicines or blood thinners. Taking medicines such as aspirin and ibuprofen. These medicines can thin your blood. Do not take these medicines unless your health care provider tells you to take them. Taking over-the-counter medicines, vitamins, herbs, and supplements. General instructions You may be asked to bathe or shower using a soap that kills skin bacteria. Ask your health care provider: How your surgical site will be marked. What steps will be taken to help prevent infection. These may include: Removing hair at the surgery site. Washing skin with a germ-killing soap. Taking antibiotic medicine. Plan to have someone take you home from the hospital  or clinic. What happens during the procedure? An IV will be inserted into one of your veins. You may be given a medicine to help you relax (sedative). You will be given a  medicine to make you fall asleep (general anesthetic). A thin, flexible tube (urinarycatheter) will be passed through your urethra and into your bladder. The catheter will drain urine from your bladder during the procedure. Several small incisions will be made in your abdomen. A thin tube with a light and a small camera on the end (laparoscope) and other surgical instruments will be passed through your incisions to perform the procedure. Carbon dioxide gas may be put into your abdomen. This stretches your abdomen so that your surgeon can see your organs. Depending on which procedure needs to be done, your surgeon will do the following: For a pyeloplasty, the surgeon will remove the blockage by making an incision in your ureter or your UPJ and removing the blockage, or by moving the blood vessel that is causing the blockage. In some cases, part of your ureter may be removed, or you may need to have a soft plastic tube (stent) inserted into your ureter to prevent urine from draining out of the incision in your ureter while the incision heals. If an incision is made in your ureter or your UPJ, the incision will be closed with stitches (sutures). For a ureterolysis, the surgeon will move your ureter away from the source of the blockage. You may not need a stent. If the block is caused by kidney stones, these will be removed. A drainage tube may be placed in your abdomen. The incisions in your abdomen may be closed with sutures, skin glue, or adhesive tape. Incisions may be covered with bandages (dressings). The procedure may vary among health care providers and hospitals. What happens after the procedure? Your blood pressure, heart rate, breathing rate, and blood oxygen level will be monitored until you leave the hospital or clinic. You may continue to receive fluids and medicine through an IV. You will have some pain. Pain medicines will be available. You will be encouraged to walk around as soon as  possible. You may have to wear compression stockings. These stockings help to prevent blood clots and reduce swelling in your legs. You will have a catheter draining your urine. You may have a tube draining fluid from your surgical area. Summary Robotic-assisted ureterolysis and pyeloplasty are two procedures to relieve a blockage in the ureter or the ureteropelvic junction (UPJ). During these procedures, a computer is used to control surgical instruments that are attached to robotic arms. Follow instructions from your health care provider about taking medicines and about eating and drinking before the procedure. After the procedure, you may have a tube draining fluid from your surgical area. This information is not intended to replace advice given to you by your health care provider. Make sure you discuss any questions you have with your health care provider. Document Revised: 11/14/2018 Document Reviewed: 11/14/2018 Elsevier Patient Education  [place].

## 2022-08-09 NOTE — Progress Notes (Signed)
08/09/2022 9:57 AM   Sophia Young [birth year] [id number]  Referring provider: Celene Squibb, MD [address],  [place] [address]  Followup left UPJ obstruction   HPI: Ms Hockman is a 89OI here for followup for a left UPJ obstruction. Lasix renogram showed a dilated left renal collecting system with delayed washout of the tracer. Normal bilateral renal uptake. UA today is concerning for infection. She was treated for a UTI 1 month ago. She has worsening urinary urgency, frequency and occasional dysuria.    PMH: Past Medical History:  Diagnosis Date   Chronic back pain    Complication of anesthesia    Diabetes mellitus without complication ([place])    History of kidney stones    Hydronephrosis of left kidney    chronic   Hypercholesteremia    Hypertension    PONV (postoperative nausea and vomiting)    Ureteral stricture, left     Surgical History: Past Surgical History:  Procedure Laterality Date   ABDOMINAL HYSTERECTOMY     BACK SURGERY     BIOPSY  06/01/2017   Procedure: BIOPSY;  Surgeon: Daneil Dolin, MD;  [facility];  Service: Endoscopy;;  colon   CHOLECYSTECTOMY     COLONOSCOPY N/A 06/01/2017   Procedure: COLONOSCOPY;  Surgeon: Daneil Dolin, MD;  [facility];  Service: Endoscopy;  Laterality: N/A;  7:30 AM   COLONOSCOPY WITH PROPOFOL N/A 02/27/2021   Procedure: COLONOSCOPY WITH PROPOFOL;  Surgeon: Eloise Harman, DO;  [facility];  Service: Endoscopy;  Laterality: N/A;  10:30am   CYSTOSCOPY W/ RETROGRADES Bilateral 11/06/2020   Procedure: CYSTOSCOPY WITH RETROGRADE PYELOGRAM;  Surgeon: Cleon Gustin, MD;  Location: [facility];  Service: Urology;  Laterality: Bilateral;   CYSTOSCOPY W/ URETERAL STENT PLACEMENT Left 11/06/2020   Procedure: CYSTOSCOPY WITH STENT REPLACEMENT;  Surgeon: Cleon Gustin, MD;  Location: [facility];  Service: Urology;  Laterality: Left;   FLEXIBLE SIGMOIDOSCOPY N/A 06/03/2021   Procedure:  FLEXIBLE SIGMOIDOSCOPY;  Surgeon: Ileana Roup, MD;  Location: [facility];  Service: General;  Laterality: N/A;   POLYPECTOMY  06/01/2017   Procedure: POLYPECTOMY;  Surgeon: Daneil Dolin, MD;  [facility];  Service: Endoscopy;;  colon    SP DIL URETER Left 2011   URETERAL STENT PLACEMENT Left 2011   URETEROSCOPY Left 11/06/2020   Procedure: URETEROSCOPY- diagnostic;  Surgeon: Cleon Gustin, MD;  Location: [facility];  Service: Urology;  Laterality: Left;    Home Medications:  Allergies as of 08/09/2022   No Known Allergies      Medication List        Accurate as of August 09, 2022  9:57 AM. If you have any questions, ask your nurse or doctor.          aspirin 81 MG chewable tablet Chew 81 mg by mouth daily.   Centrum Silver 50+Women Tabs Take 1 tablet by mouth 4 (four) times a week.   cholecalciferol 25 MCG (1000 UNIT) tablet Commonly known as: VITAMIN D3 Take 1,000 Units by mouth every 14 (fourteen) days.   docusate sodium 100 MG capsule Commonly known as: COLACE Take 100 mg by mouth daily.   levothyroxine 75 MCG tablet Commonly known as: SYNTHROID Take 75 mcg by mouth daily before breakfast.   losartan 25 MG tablet Commonly known as: COZAAR Take 50 mg by mouth daily.   pravastatin 40 MG tablet Commonly known as: PRAVACHOL Take 40  mg by mouth daily.   sulfamethoxazole-trimethoprim 800-160 MG tablet Commonly known as: BACTRIM DS Take 1 tablet by mouth 2 (two) times daily.   triamterene-hydrochlorothiazide 37.5-25 MG capsule Commonly known as: DYAZIDE Take 1 capsule by mouth daily.   Xigduo XR 06-999 MG Tb24 Generic drug: Dapagliflozin Pro-metFORMIN ER Take 1 tablet by mouth daily.        Allergies: No Known Allergies  Family History: Family History  Problem Relation Age of Onset   Prostate cancer Brother    Crohn's disease Cousin    Colon cancer Neg Hx     Social History:  reports that she has quit smoking. Her  smoking use included cigarettes. She has a 1.50 pack-year smoking history. She has never used smokeless tobacco. She reports that she does not drink alcohol and does not use drugs.  ROS: All other review of systems were reviewed and are negative except what is noted above in HPI  Physical Exam: BP (!) 153/84   Pulse 74   Constitutional:  Alert and oriented, No acute distress. HEENT: [place] AT, moist mucus membranes.  Trachea midline, no masses. Cardiovascular: No clubbing, cyanosis, or edema. Respiratory: Normal respiratory effort, no increased work of breathing. GI: Abdomen is soft, nontender, nondistended, no abdominal masses GU: No CVA tenderness.  Lymph: No cervical or inguinal lymphadenopathy. Skin: No rashes, bruises or suspicious lesions. Neurologic: Grossly intact, no focal deficits, moving all 4 extremities. Psychiatric: Normal mood and affect.  Laboratory Data: Lab Results  Component Value Date   WBC 10.2 06/06/2021   HGB 12.8 06/06/2021   HCT 37.5 06/06/2021   MCV 90.4 06/06/2021   PLT 195 06/06/2021    Lab Results  Component Value Date   CREATININE 0.99 06/06/2021    No results found for: "PSA"  No results found for: "TESTOSTERONE"  Lab Results  Component Value Date   HGBA1C 7.4 (H) 04/06/2021    Urinalysis    Component Value Date/Time   COLORURINE BROWN (A) 10/26/2020 1622   APPEARANCEUR Cloudy (A) 07/14/2022 0856   LABSPEC 1.023 10/26/2020 1622   PHURINE 6.0 10/26/2020 1622   GLUCOSEU 3+ (A) 07/14/2022 0856   HGBUR LARGE (A) 10/26/2020 1622   BILIRUBINUR Negative 07/14/2022 0856   KETONESUR NEGATIVE 10/26/2020 1622   PROTEINUR 2+ (A) 07/14/2022 0856   PROTEINUR >=300 (A) 10/26/2020 1622   UROBILINOGEN 4.0 (H) 01/22/2010 0859   NITRITE Negative 07/14/2022 0856   NITRITE NEGATIVE 10/26/2020 1622   LEUKOCYTESUR 3+ (A) 07/14/2022 0856   LEUKOCYTESUR MODERATE (A) 10/26/2020 1622    Lab Results  Component Value Date   LABMICR See below: 07/14/2022    WBCUA >30 (H) 07/14/2022   LABEPIT 0-10 07/14/2022   MUCUS Present 12/19/2020   BACTERIA Many (A) 07/14/2022    Pertinent Imaging: Renogram 07/21/2022: Images reviewed and discussed with the patient Results for orders placed during the hospital encounter of 02/10/10  DG Abd 1 View  Narrative Clinical Data: Left renal calculus  ABDOMEN - 1 VIEW  Comparison: None Correlation:  CT abdomen pelvis 01/22/2010  Findings: Surgical clip left pelvis. Elongated calcification left pelvis, corresponding to a vascular calcification seen on preceding CT. Facet degenerative changes lower lumbar spine. No definite urinary tract calcification identified. Bowel gas pattern normal. No acute bony findings.  IMPRESSION: No definite urinary tract calcification identified.  Provider: Jennye Boroughs  No results found for this or any previous visit.  No results found for this or any previous visit.  No results found for this or  any previous visit.  Results for orders placed during the hospital encounter of 07/07/22  US RENAL  Narrative CLINICAL DATA:  Congenital LEFT UPJ obstruction  EXAM: RENAL / URINARY TRACT ULTRASOUND COMPLETE  COMPARISON:  05/29/2021  FINDINGS: Right Kidney:  Renal measurements: 11.3 x 5.1 x 5.6 cm = volume: 168 mL. Normal cortical thickness and echogenicity. No mass, hydronephrosis, or shadowing calcification  Left Kidney:  Renal measurements: 14.2 x 6.6 x 5.8 cm = volume: 284 ML. Cortical thinning. Upper normal cortical echogenicity. Severe hydronephrosis with associated cortical thinning. No mass or shadowing calcification. Persistence of LEFT hydronephrosis after voiding.  Bladder:  RIGHT ureteral jet visualized. Bladder otherwise unremarkable. No wall thickening or mass seen.  Other:  None.  IMPRESSION: Severe LEFT hydronephrosis and associated LEFT renal cortical thinning, slightly increased from previous study.  Remainder of exam  unremarkable.   Electronically Signed By: Lavonia Dana M.D. On: 07/08/2022 18:36  No valid procedures specified. No results found for this or any previous visit.  No results found for this or any previous visit.   Assessment & Plan:    1. UPJ obstruction, congenital -We discussed the management including observation, chronic stent placement, and robotic pyeloplasty. After discussing the options the patient elects for robotic pyeloplasty. Risks/benefits/alternatives discussed - Urinalysis, Routine w reflex microscopic  2. Acute cystitis -Urine for culture -Bactrim DS BID for 7 days. Patient will start suppression until her surgery  No follow-ups on file.  Nicolette Bang, MD  [facility] [place]

## 2022-08-11 LAB — URINE CULTURE

## 2022-08-12 DIAGNOSIS — Z87891 Personal history of nicotine dependence: Secondary | ICD-10-CM | POA: Diagnosis not present

## 2022-08-12 DIAGNOSIS — Z7982 Long term (current) use of aspirin: Secondary | ICD-10-CM | POA: Diagnosis not present

## 2022-08-12 DIAGNOSIS — I10 Essential (primary) hypertension: Secondary | ICD-10-CM | POA: Diagnosis not present

## 2022-08-12 DIAGNOSIS — E785 Hyperlipidemia, unspecified: Secondary | ICD-10-CM | POA: Diagnosis not present

## 2022-08-12 DIAGNOSIS — N390 Urinary tract infection, site not specified: Secondary | ICD-10-CM | POA: Diagnosis not present

## 2022-08-12 DIAGNOSIS — E039 Hypothyroidism, unspecified: Secondary | ICD-10-CM | POA: Diagnosis not present

## 2022-08-12 DIAGNOSIS — E1169 Type 2 diabetes mellitus with other specified complication: Secondary | ICD-10-CM | POA: Diagnosis not present

## 2022-08-12 DIAGNOSIS — E669 Obesity, unspecified: Secondary | ICD-10-CM | POA: Diagnosis not present

## 2022-08-31 ENCOUNTER — Other Ambulatory Visit: Payer: Self-pay

## 2022-08-31 ENCOUNTER — Telehealth: Payer: Self-pay

## 2022-08-31 DIAGNOSIS — R399 Unspecified symptoms and signs involving the genitourinary system: Secondary | ICD-10-CM

## 2022-08-31 NOTE — Telephone Encounter (Signed)
Verbal from Dr. Alyson Ingles he wants patient to drop off a urine.  I added pt to tomorrows schedule for ua and culture.  I am unable to reach patient by phone or leave a voicemail.  Verbal from Dr. Alyson Ingles he will want patient to begin daily bactrim until her surgery but we need to see what her ua looks like before sending this rx in, she may need treatment prior to the daily bactrim.

## 2022-08-31 NOTE — Telephone Encounter (Signed)
Patient called to see if you wanted her to start a daily abx until her surgery in February?  She has finished the Bactrim Rx but states her urine still has an odor.  Please advise.

## 2022-09-01 ENCOUNTER — Other Ambulatory Visit: Payer: Self-pay

## 2022-09-01 ENCOUNTER — Telehealth: Payer: Self-pay

## 2022-09-01 ENCOUNTER — Ambulatory Visit (INDEPENDENT_AMBULATORY_CARE_PROVIDER_SITE_OTHER): Payer: PPO | Admitting: Urology

## 2022-09-01 DIAGNOSIS — R399 Unspecified symptoms and signs involving the genitourinary system: Secondary | ICD-10-CM | POA: Diagnosis not present

## 2022-09-01 LAB — URINALYSIS, ROUTINE W REFLEX MICROSCOPIC
Bilirubin, UA: NEGATIVE
Ketones, UA: NEGATIVE
Nitrite, UA: POSITIVE — AB
Specific Gravity, UA: 1.02 (ref 1.005–1.030)
Urobilinogen, Ur: 0.2 mg/dL (ref 0.2–1.0)
pH, UA: 6 (ref 5.0–7.5)

## 2022-09-01 LAB — MICROSCOPIC EXAMINATION: WBC, UA: >30 /hpf — AB (ref 0–5)

## 2022-09-01 MED ORDER — SULFAMETHOXAZOLE-TRIMETHOPRIM 800-160 MG PO TABS: 1.0000 | ORAL_TABLET | Freq: Two times a day (BID) | ORAL | 0 refills | Status: DC

## 2022-09-01 MED ORDER — SULFAMETHOXAZOLE-TRIMETHOPRIM 800-160 MG PO TABS: 1.0000 | ORAL_TABLET | Freq: Every day | ORAL | 1 refills | Status: DC

## 2022-09-01 NOTE — Telephone Encounter (Signed)
Pt wanted to add a medication to her list.  ozempic '2mg'$   Thanks, Helene Kelp

## 2022-09-01 NOTE — Telephone Encounter (Signed)
Reviewed UA with Dr Alyson Ingles Bactrim DS 1 tablet by mouth twice daily for 7 days then Bactrim DS 1 tablet by mouth daily until 1 week after surgery.

## 2022-09-01 NOTE — Telephone Encounter (Signed)
Prescription e-scribed and one faxed to patient pharmacy. Patient called and notified of medications.

## 2022-09-01 NOTE — Progress Notes (Signed)
Opened in error

## 2022-09-01 NOTE — Progress Notes (Signed)
Patient started on Antix and UC pending

## 2022-09-01 NOTE — Addendum Note (Signed)
Addended by: Dorisann Frames on: 09/01/2022 02:32 PM   Modules accepted: Orders

## 2022-09-01 NOTE — Telephone Encounter (Signed)
Medication is on list.

## 2022-09-04 LAB — URINE CULTURE

## 2022-09-09 DIAGNOSIS — E039 Hypothyroidism, unspecified: Secondary | ICD-10-CM | POA: Diagnosis not present

## 2022-09-09 DIAGNOSIS — E1169 Type 2 diabetes mellitus with other specified complication: Secondary | ICD-10-CM | POA: Diagnosis not present

## 2022-09-09 DIAGNOSIS — E559 Vitamin D deficiency, unspecified: Secondary | ICD-10-CM | POA: Diagnosis not present

## 2022-09-09 DIAGNOSIS — E782 Mixed hyperlipidemia: Secondary | ICD-10-CM | POA: Diagnosis not present

## 2022-09-14 ENCOUNTER — Other Ambulatory Visit: Payer: Self-pay

## 2022-09-14 ENCOUNTER — Telehealth: Payer: Self-pay

## 2022-09-14 MED ORDER — SULFAMETHOXAZOLE-TRIMETHOPRIM 800-160 MG PO TABS: ORAL_TABLET | ORAL | 1 refills | Status: DC

## 2022-09-14 MED ORDER — SULFAMETHOXAZOLE-TRIMETHOPRIM 800-160 MG PO TABS: 1.0000 | ORAL_TABLET | Freq: Every day | ORAL | 1 refills | Status: DC

## 2022-09-14 NOTE — Telephone Encounter (Signed)
Patient completed the 1st round of 7 day antibiotics.  [facility] never got the fax for the 2nd Rx for 1 a day.   sulfamethoxazole-trimethoprim (BACTRIM DS) 800-160 MG tablet   Please call pt back to let her know Rx was called in at  [phone]  Thanks, Helene Kelp

## 2022-09-15 DIAGNOSIS — E1169 Type 2 diabetes mellitus with other specified complication: Secondary | ICD-10-CM | POA: Diagnosis not present

## 2022-09-15 DIAGNOSIS — E039 Hypothyroidism, unspecified: Secondary | ICD-10-CM | POA: Diagnosis not present

## 2022-09-15 DIAGNOSIS — E559 Vitamin D deficiency, unspecified: Secondary | ICD-10-CM | POA: Diagnosis not present

## 2022-09-15 DIAGNOSIS — N189 Chronic kidney disease, unspecified: Secondary | ICD-10-CM | POA: Diagnosis not present

## 2022-09-15 DIAGNOSIS — I10 Essential (primary) hypertension: Secondary | ICD-10-CM | POA: Diagnosis not present

## 2022-09-15 DIAGNOSIS — E782 Mixed hyperlipidemia: Secondary | ICD-10-CM | POA: Diagnosis not present

## 2022-09-15 DIAGNOSIS — E1122 Type 2 diabetes mellitus with diabetic chronic kidney disease: Secondary | ICD-10-CM | POA: Diagnosis not present

## 2022-09-15 DIAGNOSIS — Z0001 Encounter for general adult medical examination with abnormal findings: Secondary | ICD-10-CM | POA: Diagnosis not present

## 2022-09-15 DIAGNOSIS — N823 Fistula of vagina to large intestine: Secondary | ICD-10-CM | POA: Diagnosis not present

## 2022-09-15 DIAGNOSIS — F411 Generalized anxiety disorder: Secondary | ICD-10-CM | POA: Diagnosis not present

## 2022-09-15 DIAGNOSIS — N1330 Unspecified hydronephrosis: Secondary | ICD-10-CM | POA: Diagnosis not present

## 2022-09-15 DIAGNOSIS — H6123 Impacted cerumen, bilateral: Secondary | ICD-10-CM | POA: Diagnosis not present

## 2022-09-20 ENCOUNTER — Telehealth: Payer: Self-pay

## 2022-09-20 NOTE — Telephone Encounter (Signed)
Insurance PA faxed today for upcoming inpatient surgery for 10/18/2022 Pending.

## 2022-09-24 ENCOUNTER — Telehealth: Payer: Self-pay

## 2022-09-24 ENCOUNTER — Other Ambulatory Visit: Payer: Self-pay

## 2022-09-24 NOTE — Telephone Encounter (Signed)
Insurance PA submitted for upcoming surgery 10/18/2022 with Dr. Alyson Ingles for CPT [id number], [place], [phone], [phone] Pending.

## 2022-09-24 NOTE — Telephone Encounter (Signed)
-----  Message from Cleon Gustin, MD sent at 09/21/2022 10:43 AM EST ----- Continue bactrim ----- Message ----- From: Audie Box, CMA Sent: 09/09/2022   8:38 AM EST To: Cleon Gustin, MD  Please review, patient started on Bactrim on 12/20

## 2022-09-24 NOTE — Telephone Encounter (Signed)
Patient aware of MD response to urine culture and confirmed she is still taking the bactrim rx as prescribed.

## 2022-09-24 NOTE — Telephone Encounter (Signed)
I spoke with Sophia Young. We have discussed possible surgery dates and 11/01/2022 was agreed upon by all parties. Patient given information about surgery date, what to expect pre-operatively and post operatively.    We discussed that a pre-op nurse will be calling to set up the pre-op visit that will take place prior to surgery. Informed patient that our office will communicate any additional care to be provided after surgery.    Patients questions or concerns were discussed during our call. Advised to call our office should there be any additional information, questions or concerns that arise. Patient verbalized understanding.    Pending insurance HTA  Patient understand to hold Ozempic after 10/19/2022.

## 2022-09-30 NOTE — Telephone Encounter (Signed)
Insurance approved for new date 11/01/2022

## 2022-10-27 NOTE — Patient Instructions (Signed)
Sophia Young  10/27/2022     @PREFPERIOPPHARMACY$ @   Your procedure is scheduled on  11/01/2022.   Report to [facility] at  [phone]  A.M.   Call this number if you have problems the morning of surgery:  [phone]  If you experience any cold or flu symptoms such as cough, fever, chills, shortness of breath, etc. between now and your scheduled surgery, please notify us at the above number.   Remember:  Do not eat or drink after midnight.         Your last dose of Ozempic should be on 10/24/2022.     Take these medicines the morning of surgery with A SIP OF WATER                                    levothyroxine.     Do not wear jewelry, make-up or nail polish.  Do not wear lotions, powders, or perfumes, or deodorant.  Do not shave 48 hours prior to surgery.  Men may shave face and neck.  Do not bring valuables to the hospital.  [facility] is not responsible for any belongings or valuables.  Contacts, dentures or bridgework may not be worn into surgery.  Leave your suitcase in the car.  After surgery it may be brought to your room.  For patients admitted to the hospital, discharge time will be determined by your treatment team.  Patients discharged the day of surgery will not be allowed to drive home.    Special instructions:   DO NOT smoke tobacco or vape for 24 hours before your procedure.  Please read over the following fact sheets that you were given. Coughing and Deep Breathing, Surgical Site Infection Prevention, Anesthesia Post-op Instructions, and Care and Recovery After Surgery      Robot-Assisted Ureterolysis and Pyeloplasty, Care After This sheet gives you information about how to care for yourself after your procedure. Your health care provider may also give you more specific instructions. If you have problems or questions, contact your health care provider. What can I expect after the procedure? After the procedure, it is common to have: Mild  pain under your rib cage, in your back, or in your shoulder. Nausea for up to 2 days. Decreased appetite for up to 1 week. Soreness and mild pain in your abdomen. A small amount of blood or clear fluid coming from your incisions. Fewer bowel movements than usual (constipation) and moderate discomfort due to gas. This may last for several days. Soreness or mild discomfort from your urinary catheter. After your catheter is removed, you may have mild soreness, especially when urinating. A small amount of blood in your urine. This may last for several days. Follow these instructions at home: Medicines Take over-the-counter and prescription medicines only as told by your health care provider. Ask your health care provider if the medicine prescribed to you requires you to avoid driving or using heavy machinery. If you were prescribed an antibiotic medicine, take it as told by your health care provider. Do not stop taking the antibiotic even if you start to feel better. Eating and drinking Avoid any foods or drinks that cause gas or abdominal discomfort. Drink enough fluid to keep your urine pale yellow. Do not drink alcohol for as long as told by your health care provider. This is especially important if you are taking prescription  pain medicines. Managing constipation Your procedure may cause constipation. To prevent or treat constipation, you may need to: Take over-the-counter or prescription medicines. Eat foods that are high in fiber, such as beans, whole grains, and fresh fruits and vegetables. Limit foods that are high in fat and processed sugars, such as fried or sweet foods. Incision and drainage tube care  Follow instructions from your health care provider about how to take care of your incisions. Make sure you: Wash your hands with soap and water before and after you change your bandages (dressings). If soap and water are not available, use hand sanitizer. Change your dressings as told  by your health care provider. Leave stitches (sutures), skin glue, or adhesive strips in place. These skin closures may need to stay in place for 2 weeks or longer. If adhesive strip edges start to loosen and curl up, you may trim the loose edges. Do not remove adhesive strips completely unless your health care provider tells you to do that. Keep your incision areas clean and dry. Check your incision areas every day for signs of infection. Check for: Redness or swelling. More pain. More fluid or blood. Warmth. Pus or a bad smell. If you have a drainage tube, follow instructions from your health care provider about how to care for it. Do not remove the tube yourself. Change the dressing around the tube as told by your health care provider. Write down how much fluid drains each day. Note any changes in how the fluid looks or smells. Activity Rest as told by your health care provider. Avoid sitting for a long time without moving. Get up to take short walks every 1-2 hours. This is important to improve blood flow and breathing. Ask for help if you feel weak or unsteady. Do not lift anything that is heavier than 10 lb (4.5 kg), or the limit that you are told, until your health care provider says that it is safe. Return to your normal activities as told by your health care provider. Ask your health care provider what activities are safe for you. General instructions Do not take baths, swim, or use a hot tub until your health care provider approves. Ask your health care provider if you may take showers. You may only be allowed to take sponge baths. If you have a urinary catheter, follow instructions from your health care provider about caring for your catheter and your drainage bag. Wear compression stockings as told by your health care provider. These stockings help to prevent blood clots and reduce swelling in your legs. Keep all follow-up visits as told by your health care provider. This is  important. Contact a health care provider if you: Feel nauseous for more than 2 days after your procedure. Vomit. Develop a cough. Have pain that gets worse or does not get better with medicine. Have difficulty urinating. Have pain when you urinate. Have an increased amount of blood in your urine. Have not had a bowel movement in more than 2 days. Have a fever. Have signs of infection in the incision areas, such as: Redness or swelling. More pain. More fluid or blood. An incision that feels warm to the touch. Pus or a bad smell. Get help right away if: You have chest pain. You have swelling, redness, or pain in your legs. You have severe pain. Your urinary catheter has been removed and you are not able to urinate. You have a urinary catheter in place and the catheter is not draining urine.  Summary After the procedure, it is common to have nausea and mild pain in your abdomen. Follow instructions from your health care provider about how to take care of your incisions. Drink enough fluids to keep your urine pale yellow. Return to your normal activities as told by your health care provider. Ask your health care provider what activities are safe for you. This information is not intended to replace advice given to you by your health care provider. Make sure you discuss any questions you have with your health care provider. Document Revised: 11/14/2018 Document Reviewed: 11/14/2018 Elsevier Patient Education  [place] Anesthesia, Adult, Care After The following information offers guidance on how to care for yourself after your procedure. Your health care provider may also give you more specific instructions. If you have problems or questions, contact your health care provider. What can I expect after the procedure? After the procedure, it is common for people to: Have pain or discomfort at the IV site. Have nausea or vomiting. Have a sore throat or hoarseness. Have  trouble concentrating. Feel cold or chills. Feel weak, sleepy, or tired (fatigue). Have soreness and body aches. These can affect parts of the body that were not involved in surgery. Follow these instructions at home: For the time period you were told by your health care provider:  Rest. Do not participate in activities where you could fall or become injured. Do not drive or use machinery. Do not drink alcohol. Do not take sleeping pills or medicines that cause drowsiness. Do not make important decisions or sign legal documents. Do not take care of children on your own. General instructions Drink enough fluid to keep your urine pale yellow. If you have sleep apnea, surgery and certain medicines can increase your risk for breathing problems. Follow instructions from your health care provider about wearing your sleep device: Anytime you are sleeping, including during daytime naps. While taking prescription pain medicines, sleeping medicines, or medicines that make you drowsy. Return to your normal activities as told by your health care provider. Ask your health care provider what activities are safe for you. Take over-the-counter and prescription medicines only as told by your health care provider. Do not use any products that contain nicotine or tobacco. These products include cigarettes, chewing tobacco, and vaping devices, such as e-cigarettes. These can delay incision healing after surgery. If you need help quitting, ask your health care provider. Contact a health care provider if: You have nausea or vomiting that does not get better with medicine. You vomit every time you eat or drink. You have pain that does not get better with medicine. You cannot urinate or have bloody urine. You develop a skin rash. You have a fever. Get help right away if: You have trouble breathing. You have chest pain. You vomit blood. These symptoms may be an emergency. Get help right away. Call 911. Do  not wait to see if the symptoms will go away. Do not drive yourself to the hospital. Summary After the procedure, it is common to have a sore throat, hoarseness, nausea, vomiting, or to feel weak, sleepy, or fatigue. For the time period you were told by your health care provider, do not drive or use machinery. Get help right away if you have difficulty breathing, have chest pain, or vomit blood. These symptoms may be an emergency. This information is not intended to replace advice given to you by your health care provider. Make sure you discuss any questions you have  with your health care provider. Document Revised: 11/27/2021 Document Reviewed: 11/27/2021 Elsevier Patient Education  [employer].

## 2022-10-28 ENCOUNTER — Other Ambulatory Visit: Payer: Self-pay

## 2022-10-28 ENCOUNTER — Encounter (HOSPITAL_COMMUNITY)
Admission: RE | Admit: 2022-10-28 | Discharge: 2022-10-28 | Disposition: A | Discharge status: Home / Self Care | Payer: PPO | Source: Ambulatory Visit | Attending: Urology | Admitting: Urology

## 2022-10-28 ENCOUNTER — Encounter (HOSPITAL_COMMUNITY): Payer: Self-pay

## 2022-10-28 VITALS — BP 159/50 | HR 56 | Temp 97.8°F | Resp 18 | Ht 63.0 in | Wt 195.1 lb

## 2022-10-28 DIAGNOSIS — Z01818 Encounter for other preprocedural examination: Secondary | ICD-10-CM | POA: Diagnosis not present

## 2022-10-28 DIAGNOSIS — E119 Type 2 diabetes mellitus without complications: Secondary | ICD-10-CM | POA: Diagnosis not present

## 2022-10-28 DIAGNOSIS — I10 Essential (primary) hypertension: Secondary | ICD-10-CM | POA: Diagnosis not present

## 2022-10-28 LAB — BASIC METABOLIC PANEL
Anion gap: 10 (ref 5–15)
BUN: 17 mg/dL (ref 8–23)
CO2: 25 mmol/L (ref 22–32)
Calcium: 9.4 mg/dL (ref 8.9–10.3)
Chloride: 97 mmol/L — ABNORMAL LOW (ref 98–111)
Creatinine, Ser: 0.75 mg/dL (ref 0.44–1.00)
GFR, Estimated: >60 mL/min (ref >60)
Glucose, Bld: 99 mg/dL (ref 70–99)
Potassium: 3.8 mmol/L (ref 3.5–5.1)
Sodium: 132 mmol/L — ABNORMAL LOW (ref 135–145)

## 2022-10-28 LAB — HEMOGLOBIN A1C
Hgb A1c MFr Bld: 4.8 % (ref 4.8–5.6)
Mean Plasma Glucose: 91.06 mg/dL

## 2022-10-28 NOTE — Pre-Procedure Instructions (Signed)
PAT interview completed. Pt verbalized understanding of instructions and arrival time.

## 2022-11-01 ENCOUNTER — Encounter (HOSPITAL_COMMUNITY): Payer: Self-pay | Admitting: Urology

## 2022-11-01 ENCOUNTER — Inpatient Hospital Stay (HOSPITAL_COMMUNITY)
Admission: RE | Admit: 2022-11-01 | Discharge: 2022-11-05 | DRG: 661 | Disposition: A | Discharge status: Home / Self Care | Payer: PPO | Attending: Urology | Admitting: Urology

## 2022-11-01 ENCOUNTER — Inpatient Hospital Stay (HOSPITAL_COMMUNITY): Payer: PPO | Admitting: Certified Registered"

## 2022-11-01 ENCOUNTER — Encounter (HOSPITAL_COMMUNITY)
Admission: RE | Disposition: A | Discharge status: Home / Self Care | Payer: Self-pay | Source: Home / Self Care | Attending: Urology

## 2022-11-01 ENCOUNTER — Other Ambulatory Visit: Payer: Self-pay

## 2022-11-01 ENCOUNTER — Inpatient Hospital Stay (HOSPITAL_BASED_OUTPATIENT_CLINIC_OR_DEPARTMENT_OTHER): Payer: PPO | Admitting: Certified Registered"

## 2022-11-01 DIAGNOSIS — Z90710 Acquired absence of both cervix and uterus: Secondary | ICD-10-CM | POA: Diagnosis not present

## 2022-11-01 DIAGNOSIS — K660 Peritoneal adhesions (postprocedural) (postinfection): Secondary | ICD-10-CM | POA: Diagnosis not present

## 2022-11-01 DIAGNOSIS — R300 Dysuria: Secondary | ICD-10-CM | POA: Diagnosis present

## 2022-11-01 DIAGNOSIS — Q6239 Other obstructive defects of renal pelvis and ureter: Principal | ICD-10-CM

## 2022-11-01 DIAGNOSIS — E119 Type 2 diabetes mellitus without complications: Secondary | ICD-10-CM | POA: Diagnosis present

## 2022-11-01 DIAGNOSIS — R3915 Urgency of urination: Secondary | ICD-10-CM | POA: Diagnosis not present

## 2022-11-01 DIAGNOSIS — N135 Crossing vessel and stricture of ureter without hydronephrosis: Secondary | ICD-10-CM | POA: Diagnosis not present

## 2022-11-01 DIAGNOSIS — G8929 Other chronic pain: Secondary | ICD-10-CM | POA: Diagnosis not present

## 2022-11-01 DIAGNOSIS — M549 Dorsalgia, unspecified: Secondary | ICD-10-CM | POA: Diagnosis not present

## 2022-11-01 DIAGNOSIS — Z87891 Personal history of nicotine dependence: Secondary | ICD-10-CM

## 2022-11-01 DIAGNOSIS — Z8042 Family history of malignant neoplasm of prostate: Secondary | ICD-10-CM

## 2022-11-01 DIAGNOSIS — I10 Essential (primary) hypertension: Secondary | ICD-10-CM | POA: Diagnosis present

## 2022-11-01 DIAGNOSIS — Z87442 Personal history of urinary calculi: Secondary | ICD-10-CM

## 2022-11-01 DIAGNOSIS — Z9049 Acquired absence of other specified parts of digestive tract: Secondary | ICD-10-CM | POA: Diagnosis not present

## 2022-11-01 DIAGNOSIS — E7800 Pure hypercholesterolemia, unspecified: Secondary | ICD-10-CM | POA: Diagnosis not present

## 2022-11-01 HISTORY — PX: ROBOT ASSISTED PYELOPLASTY: SHX5143

## 2022-11-01 LAB — GLUCOSE, CAPILLARY
Glucose-Capillary: 117 mg/dL — ABNORMAL HIGH (ref 70–99)
Glucose-Capillary: 165 mg/dL — ABNORMAL HIGH (ref 70–99)
Glucose-Capillary: 120 mg/dL — ABNORMAL HIGH (ref 70–99)
Glucose-Capillary: 157 mg/dL — ABNORMAL HIGH (ref 70–99)

## 2022-11-01 LAB — BASIC METABOLIC PANEL
Anion gap: 11 (ref 5–15)
BUN: 21 mg/dL (ref 8–23)
CO2: 23 mmol/L (ref 22–32)
Calcium: 8.7 mg/dL — ABNORMAL LOW (ref 8.9–10.3)
Chloride: 105 mmol/L (ref 98–111)
Creatinine, Ser: 0.9 mg/dL (ref 0.44–1.00)
GFR, Estimated: >60 mL/min (ref >60)
Glucose, Bld: 133 mg/dL — ABNORMAL HIGH (ref 70–99)
Potassium: 3.4 mmol/L — ABNORMAL LOW (ref 3.5–5.1)
Sodium: 139 mmol/L (ref 135–145)

## 2022-11-01 LAB — CBC
HCT: 38.8 % (ref 36.0–46.0)
Hemoglobin: 13.1 g/dL (ref 12.0–15.0)
MCH: 31.6 pg (ref 26.0–34.0)
MCHC: 33.8 g/dL (ref 30.0–36.0)
MCV: 93.7 fL (ref 80.0–100.0)
Platelets: 165 10*3/uL (ref 150–400)
RBC: 4.14 MIL/uL (ref 3.87–5.11)
RDW: 13.8 % (ref 11.5–15.5)
WBC: 12.5 10*3/uL — ABNORMAL HIGH (ref 4.0–10.5)
nRBC: 0 % (ref 0.0–0.2)

## 2022-11-01 SURGERY — PYELOPLASTY, ROBOT-ASSISTED
Anesthesia: General | Site: Abdomen | Laterality: Left

## 2022-11-01 MED ORDER — PHENYLEPHRINE HCL-NACL 20-0.9 MG/250ML-% IV SOLN
INTRAVENOUS | Status: AC
  Filled 2022-11-01: qty 250

## 2022-11-01 MED ORDER — PRAVASTATIN SODIUM 40 MG PO TABS
40.0000 mg | ORAL_TABLET | Freq: Every day | ORAL | Status: DC
  Administered 2022-11-01 – 2022-11-05 (×5): 40 mg via ORAL
  Filled 2022-11-01 (×7): qty 1

## 2022-11-01 MED ORDER — FENTANYL CITRATE PF 50 MCG/ML IJ SOSY
25.0000 ug | PREFILLED_SYRINGE | INTRAMUSCULAR | Status: DC | PRN
  Administered 2022-11-01 (×3): 50 ug via INTRAVENOUS
  Filled 2022-11-01 (×3): qty 1

## 2022-11-01 MED ORDER — STERILE WATER FOR IRRIGATION IR SOLN
Status: DC | PRN
  Administered 2022-11-01: 1000 mL

## 2022-11-01 MED ORDER — DIPHENHYDRAMINE HCL 50 MG/ML IJ SOLN: 12.5000 mg | Freq: Four times a day (QID) | INTRAMUSCULAR | Status: DC | PRN

## 2022-11-01 MED ORDER — WATER FOR IRRIGATION, STERILE IR SOLN
Status: DC | PRN
  Administered 2022-11-01 (×2): 3000 mL

## 2022-11-01 MED ORDER — SUGAMMADEX SODIUM 200 MG/2ML IV SOLN
INTRAVENOUS | Status: DC | PRN
  Administered 2022-11-01: 200 mg via INTRAVENOUS

## 2022-11-01 MED ORDER — ONDANSETRON HCL 4 MG/2ML IJ SOLN
INTRAMUSCULAR | Status: AC
  Filled 2022-11-01: qty 2

## 2022-11-01 MED ORDER — PROPOFOL 10 MG/ML IV BOLUS
INTRAVENOUS | Status: DC | PRN
  Administered 2022-11-01: 150 mg via INTRAVENOUS

## 2022-11-01 MED ORDER — ASPIRIN 81 MG PO CHEW
81.0000 mg | CHEWABLE_TABLET | Freq: Every day | ORAL | Status: DC
  Administered 2022-11-01 – 2022-11-05 (×5): 81 mg via ORAL
  Filled 2022-11-01 (×5): qty 1

## 2022-11-01 MED ORDER — PHENYLEPHRINE 80 MCG/ML (10ML) SYRINGE FOR IV PUSH (FOR BLOOD PRESSURE SUPPORT)
PREFILLED_SYRINGE | INTRAVENOUS | Status: DC | PRN
  Administered 2022-11-01: 160 ug via INTRAVENOUS

## 2022-11-01 MED ORDER — GLYCOPYRROLATE PF 0.2 MG/ML IJ SOSY
PREFILLED_SYRINGE | INTRAMUSCULAR | Status: DC | PRN
  Administered 2022-11-01: .2 mg via INTRAVENOUS

## 2022-11-01 MED ORDER — INSULIN ASPART 100 UNIT/ML IJ SOLN
0.0000 [IU] | Freq: Every day | INTRAMUSCULAR | Status: DC
  Filled 2022-11-01: qty 0.05

## 2022-11-01 MED ORDER — ROCURONIUM BROMIDE 10 MG/ML (PF) SYRINGE
PREFILLED_SYRINGE | INTRAVENOUS | Status: AC
  Filled 2022-11-01: qty 10

## 2022-11-01 MED ORDER — ONDANSETRON HCL 4 MG/2ML IJ SOLN
4.0000 mg | INTRAMUSCULAR | Status: DC | PRN
  Administered 2022-11-01 – 2022-11-02 (×4): 4 mg via INTRAVENOUS
  Filled 2022-11-01 (×4): qty 2

## 2022-11-01 MED ORDER — MIDAZOLAM HCL 2 MG/2ML IJ SOLN
INTRAMUSCULAR | Status: AC
  Filled 2022-11-01: qty 2

## 2022-11-01 MED ORDER — LIDOCAINE HCL (PF) 2 % IJ SOLN
INTRAMUSCULAR | Status: AC
  Filled 2022-11-01: qty 5

## 2022-11-01 MED ORDER — SODIUM CHLORIDE 0.9 % IV SOLN
INTRAVENOUS | Status: DC
  Administered 2022-11-04: 1000 mL via INTRAVENOUS

## 2022-11-01 MED ORDER — ROCURONIUM BROMIDE 10 MG/ML (PF) SYRINGE
PREFILLED_SYRINGE | INTRAVENOUS | Status: DC | PRN
  Administered 2022-11-01: 60 mg via INTRAVENOUS
  Administered 2022-11-01 (×2): 20 mg via INTRAVENOUS

## 2022-11-01 MED ORDER — ZOLPIDEM TARTRATE 5 MG PO TABS: 5.0000 mg | ORAL_TABLET | Freq: Every evening | ORAL | Status: DC | PRN

## 2022-11-01 MED ORDER — BUPIVACAINE LIPOSOME 1.3 % IJ SUSP
INTRAMUSCULAR | Status: DC | PRN
  Administered 2022-11-01: 20 mL

## 2022-11-01 MED ORDER — OXYCODONE HCL 5 MG PO TABS
5.0000 mg | ORAL_TABLET | ORAL | Status: DC | PRN
  Administered 2022-11-02: 5 mg via ORAL
  Filled 2022-11-01: qty 1

## 2022-11-01 MED ORDER — PROPOFOL 10 MG/ML IV BOLUS
INTRAVENOUS | Status: AC
  Filled 2022-11-01: qty 20

## 2022-11-01 MED ORDER — SENNOSIDES-DOCUSATE SODIUM 8.6-50 MG PO TABS
2.0000 | ORAL_TABLET | Freq: Every day | ORAL | Status: DC
  Administered 2022-11-01 – 2022-11-04 (×4): 2 via ORAL
  Filled 2022-11-01 (×5): qty 2

## 2022-11-01 MED ORDER — EPHEDRINE 5 MG/ML INJ
INTRAVENOUS | Status: AC
  Filled 2022-11-01: qty 5

## 2022-11-01 MED ORDER — LEVOTHYROXINE SODIUM 75 MCG PO TABS
75.0000 ug | ORAL_TABLET | Freq: Every day | ORAL | Status: DC
  Administered 2022-11-02 – 2022-11-05 (×4): 75 ug via ORAL
  Filled 2022-11-01 (×5): qty 1

## 2022-11-01 MED ORDER — FENTANYL CITRATE (PF) 250 MCG/5ML IJ SOLN
INTRAMUSCULAR | Status: AC
  Filled 2022-11-01: qty 5

## 2022-11-01 MED ORDER — PHENYLEPHRINE 80 MCG/ML (10ML) SYRINGE FOR IV PUSH (FOR BLOOD PRESSURE SUPPORT)
PREFILLED_SYRINGE | INTRAVENOUS | Status: AC
  Filled 2022-11-01: qty 10

## 2022-11-01 MED ORDER — BUPIVACAINE-EPINEPHRINE (PF) 0.25% -1:200000 IJ SOLN
INTRAMUSCULAR | Status: AC
  Filled 2022-11-01: qty 30

## 2022-11-01 MED ORDER — HYDROMORPHONE HCL 1 MG/ML IJ SOLN
0.5000 mg | INTRAMUSCULAR | Status: DC | PRN
  Administered 2022-11-01: 0.5 mg via INTRAVENOUS
  Administered 2022-11-01: 1 mg via INTRAVENOUS
  Administered 2022-11-02: 0.5 mg via INTRAVENOUS
  Administered 2022-11-02: 1 mg via INTRAVENOUS
  Administered 2022-11-02 (×2): 0.5 mg via INTRAVENOUS
  Filled 2022-11-01: qty 1
  Filled 2022-11-01: qty 0.5
  Filled 2022-11-01: qty 1
  Filled 2022-11-01: qty 0.5
  Filled 2022-11-01 (×2): qty 1
  Filled 2022-11-01 (×2): qty 0.5

## 2022-11-01 MED ORDER — LACTATED RINGERS IV SOLN: INTRAVENOUS | Status: DC | PRN

## 2022-11-01 MED ORDER — CHLORHEXIDINE GLUCONATE 0.12 % MT SOLN
OROMUCOSAL | Status: AC
  Filled 2022-11-01: qty 15

## 2022-11-01 MED ORDER — ONDANSETRON HCL 4 MG/2ML IJ SOLN
INTRAMUSCULAR | Status: DC | PRN
  Administered 2022-11-01: 4 mg via INTRAVENOUS

## 2022-11-01 MED ORDER — OXYCODONE HCL 5 MG PO TABS: 5.0000 mg | ORAL_TABLET | Freq: Once | ORAL | Status: DC | PRN

## 2022-11-01 MED ORDER — DEXAMETHASONE SODIUM PHOSPHATE 4 MG/ML IJ SOLN
INTRAMUSCULAR | Status: DC | PRN
  Administered 2022-11-01: 5 mg via INTRAVENOUS

## 2022-11-01 MED ORDER — INSULIN ASPART 100 UNIT/ML IJ SOLN
0.0000 [IU] | Freq: Three times a day (TID) | INTRAMUSCULAR | Status: DC
  Administered 2022-11-02: 2 [IU] via SUBCUTANEOUS
  Administered 2022-11-03: 8 [IU] via SUBCUTANEOUS
  Administered 2022-11-03: 2 [IU] via SUBCUTANEOUS
  Filled 2022-11-01: qty 0.15

## 2022-11-01 MED ORDER — ACETAMINOPHEN 325 MG PO TABS: 650.0000 mg | ORAL_TABLET | ORAL | Status: DC | PRN

## 2022-11-01 MED ORDER — CHLORHEXIDINE GLUCONATE CLOTH 2 % EX PADS
6.0000 | MEDICATED_PAD | Freq: Once | CUTANEOUS | Status: DC
Start: 2022-11-01 — End: 2022-11-01

## 2022-11-01 MED ORDER — MIDAZOLAM HCL 2 MG/2ML IJ SOLN
INTRAMUSCULAR | Status: DC | PRN
  Administered 2022-11-01: 2 mg via INTRAVENOUS

## 2022-11-01 MED ORDER — DIPHENHYDRAMINE HCL 12.5 MG/5ML PO ELIX: 12.5000 mg | ORAL_SOLUTION | Freq: Four times a day (QID) | ORAL | Status: DC | PRN

## 2022-11-01 MED ORDER — DIPHENHYDRAMINE HCL 50 MG/ML IJ SOLN
INTRAMUSCULAR | Status: DC | PRN
  Administered 2022-11-01: 25 mg via INTRAVENOUS

## 2022-11-01 MED ORDER — TRIAMTERENE-HCTZ 37.5-25 MG PO TABS
1.0000 | ORAL_TABLET | Freq: Every day | ORAL | Status: DC
  Administered 2022-11-01 – 2022-11-05 (×5): 1 via ORAL
  Filled 2022-11-01 (×7): qty 1

## 2022-11-01 MED ORDER — OXYBUTYNIN CHLORIDE 5 MG PO TABS: 5.0000 mg | ORAL_TABLET | Freq: Three times a day (TID) | ORAL | Status: DC | PRN

## 2022-11-01 MED ORDER — DIPHENHYDRAMINE HCL 50 MG/ML IJ SOLN
INTRAMUSCULAR | Status: AC
  Filled 2022-11-01: qty 1

## 2022-11-01 MED ORDER — ONDANSETRON HCL 4 MG/2ML IJ SOLN: 4.0000 mg | Freq: Once | INTRAMUSCULAR | Status: DC | PRN

## 2022-11-01 MED ORDER — DEXMEDETOMIDINE HCL IN NACL 80 MCG/20ML IV SOLN
INTRAVENOUS | Status: DC | PRN
  Administered 2022-11-01: 8 ug via BUCCAL

## 2022-11-01 MED ORDER — FENTANYL CITRATE (PF) 250 MCG/5ML IJ SOLN
INTRAMUSCULAR | Status: DC | PRN
  Administered 2022-11-01: 50 ug via INTRAVENOUS
  Administered 2022-11-01: 100 ug via INTRAVENOUS
  Administered 2022-11-01 (×2): 50 ug via INTRAVENOUS

## 2022-11-01 MED ORDER — LOSARTAN POTASSIUM 50 MG PO TABS
50.0000 mg | ORAL_TABLET | Freq: Every day | ORAL | Status: DC
  Administered 2022-11-01 – 2022-11-05 (×5): 50 mg via ORAL
  Filled 2022-11-01 (×7): qty 1

## 2022-11-01 MED ORDER — BUPIVACAINE LIPOSOME 1.3 % IJ SUSP
INTRAMUSCULAR | Status: AC
  Filled 2022-11-01: qty 20

## 2022-11-01 MED ORDER — LIDOCAINE 2% (20 MG/ML) 5 ML SYRINGE
INTRAMUSCULAR | Status: DC | PRN
  Administered 2022-11-01: 100 mg via INTRAVENOUS

## 2022-11-01 MED ORDER — EPHEDRINE SULFATE-NACL 50-0.9 MG/10ML-% IV SOSY
PREFILLED_SYRINGE | INTRAVENOUS | Status: DC | PRN
  Administered 2022-11-01: 5 mg via INTRAVENOUS
  Administered 2022-11-01: 10 mg via INTRAVENOUS

## 2022-11-01 MED ORDER — CEFAZOLIN SODIUM-DEXTROSE 2-4 GM/100ML-% IV SOLN
2.0000 g | INTRAVENOUS | Status: AC
  Administered 2022-11-01: 2 g via INTRAVENOUS
  Filled 2022-11-01: qty 100

## 2022-11-01 MED ORDER — OXYCODONE HCL 5 MG/5ML PO SOLN: 5.0000 mg | Freq: Once | ORAL | Status: DC | PRN

## 2022-11-01 SURGICAL SUPPLY — 55 items
BLADE SURG 15 STRL LF DISP TIS (BLADE) ×1 IMPLANT
BLADE SURG 15 STRL SS (BLADE) ×2
CHLORAPREP W/TINT 26 (MISCELLANEOUS) ×2 IMPLANT
CLOTH BEACON ORANGE TIMEOUT ST (SAFETY) ×2 IMPLANT
COVER LIGHT HANDLE STERIS (MISCELLANEOUS) ×2 IMPLANT
COVER TIP SHEARS 8 DVNC (MISCELLANEOUS) ×2 IMPLANT
COVER TIP SHEARS 8MM DA VINCI (MISCELLANEOUS) ×2
DERMABOND ADVANCED .7 DNX12 (GAUZE/BANDAGES/DRESSINGS) ×3 IMPLANT
DRAIN CHANNEL RND F F (WOUND CARE) ×1 IMPLANT
DRAPE ARM DVNC X/XI (DISPOSABLE) ×8 IMPLANT
DRAPE COLUMN DVNC XI (DISPOSABLE) ×2 IMPLANT
DRAPE DA VINCI XI ARM (DISPOSABLE) ×8
DRAPE DA VINCI XI COLUMN (DISPOSABLE) ×2
DRAPE HALF SHEET 40X57 (DRAPES) ×1 IMPLANT
DRAPE INCISE IOBAN 66X45 STRL (DRAPES) ×2 IMPLANT
ELECT REM PT RETURN 15FT ADLT (MISCELLANEOUS) ×2 IMPLANT
GLOVE BIO SURGEON STRL SZ8 (GLOVE) ×4 IMPLANT
GLOVE BIOGEL PI IND STRL 6 (GLOVE) ×1 IMPLANT
GLOVE BIOGEL PI IND STRL 6.5 (GLOVE) ×2 IMPLANT
GLOVE BIOGEL PI IND STRL 7.0 (GLOVE) ×6 IMPLANT
GLOVE BIOGEL PI IND STRL 8 (GLOVE) ×4 IMPLANT
GLOVE ECLIPSE 6.5 STRL STRAW (GLOVE) ×2 IMPLANT
GLOVE SURG SS PI 7.5 STRL IVOR (GLOVE) ×2 IMPLANT
GOWN STRL REUS W/TWL LRG LVL3 (GOWN DISPOSABLE) ×6 IMPLANT
GOWN STRL REUS W/TWL XL LVL3 (GOWN DISPOSABLE) ×3 IMPLANT
GUIDEWIRE STR ZIPWIRE 035X150 (MISCELLANEOUS) ×2 IMPLANT
IRRIGATION STRYKERFLOW (MISCELLANEOUS) ×1 IMPLANT
IRRIGATOR STRYKERFLOW (MISCELLANEOUS) ×2
KIT TURNOVER KIT A (KITS) ×1 IMPLANT
MANIFOLD NEPTUNE II (INSTRUMENTS) ×2 IMPLANT
NDL HYPO 21X1.5 SAFETY (NEEDLE) IMPLANT
NDL INSUFFLATION 14GA 120MM (NEEDLE) ×1 IMPLANT
NEEDLE HYPO 21X1.5 SAFETY (NEEDLE) ×2 IMPLANT
NEEDLE INSUFFLATION 14GA 120MM (NEEDLE) ×2 IMPLANT
PACK LAP CHOLE LZT030E (CUSTOM PROCEDURE TRAY) ×2 IMPLANT
PAD ARMBOARD 7.5X6 YLW CONV (MISCELLANEOUS) ×2 IMPLANT
PENCIL HANDSWITCHING (ELECTRODE) ×1 IMPLANT
SEAL CANN UNIV 5-8 DVNC XI (MISCELLANEOUS) ×8 IMPLANT
SEAL XI 5MM-8MM UNIVERSAL (MISCELLANEOUS) ×8
SET BASIN LINEN APH (SET/KITS/TRAYS/PACK) ×2 IMPLANT
SET TUBE SMOKE EVAC HIGH FLOW (TUBING) ×2 IMPLANT
SPONGE DRAIN TRACH 4X4 STRL 2S (GAUZE/BANDAGES/DRESSINGS) ×1 IMPLANT
STENT URET 6FRX26 CONTOUR (STENTS) ×1 IMPLANT
SUT ETHILON 3 0 PS 1 (SUTURE) ×1 IMPLANT
SUT MNCRL AB 4-0 PS2 18 (SUTURE) ×6 IMPLANT
SUT V-LOC 90 ABS 3-0 VLT  V-20 (SUTURE) ×4
SUT V-LOC 90 ABS 3-0 VLT V-20 (SUTURE) ×2 IMPLANT
SUT VICRYL 0 UR6 27IN ABS (SUTURE) ×2 IMPLANT
SUT VLOC BARB 180 ABS3/0GR12 (SUTURE) ×2
SUTURE VLOC BRB 180 ABS3/0GR12 (SUTURE) ×1 IMPLANT
SYR 20ML LL LF (SYRINGE) ×2 IMPLANT
TRAY FOLEY MTR SLVR 16FR STAT (SET/KITS/TRAYS/PACK) ×2 IMPLANT
TROCAR Z-THAD FIOS HNDL 12X100 (TROCAR) ×2 IMPLANT
WATER STERILE IRR 3000ML UROMA (IV SOLUTION) ×2 IMPLANT
WATER STERILE IRR 500ML POUR (IV SOLUTION) ×2 IMPLANT

## 2022-11-01 NOTE — Op Note (Signed)
Preoperative diagnosis:Left UPJ obstruction   Postop diagnosis: Same   Procedure: 1.  Left robot assisted laparoscopic spiral flap pyeloplasty 2. Lysis of adhesions, moderate   Attending: Nicolette Bang, MD   Assistant: Aviva Signs, MD   Anesthesia: General   Estimated blood loss: 50 cc   Drains: 1. 16 Lucier Foley catheter 2. Left 6x26 JJ ureteral stent 3. JP drain   Specimens: None   Antibiotics: ancef   Findings: We did not identify a crossing vessel as the cause for the UPJ obstruction but there was scarring at the UPJ. Ureteral defect required a vertical flap to replace the scared segment of ureter   Indications: Patient is a 67 year old with a history of left UPJ obstruction who has failed conservative management.   After discussing treatment options patient decided to proceed with left robot assisted laparoscopic pyeloplasty.   Procedure in detail: Prior to procedure consent was obtained. Patient was brought to the operating room and briefing was done sure correct patient, correct procedure, correct site.  General anesthesia was administered patient was placed in dorsal lithotomy position.  Their genitalia was then prepped and draped in usual sterile fashion.   A foley catheter was then placed and the patient was then repositioned in the right lateral decubitus position. their abdomen and flank was then prepped and draped usual sterile fashion.  A Veress needle was used to obtain pneumoperitoneum.  Once pneumoperitoneum was reestablished to 15 mmHg we then placed a 8 mm camera port lateral to the umbilicus at the lateral edge of rectus.  We then proceeded to place 3 more robotic ports. We then placed an assistant port. We then docked the robot.  We then started this dissection by removing a moderate amount of anterior abdominal wall adhesions.  We then dissected along the white line of Toldt.  We then reflected the colon medially.    We then identified the psoas muscle.  Once  this was done we traced it down to the iliac vessels and identified the ureter.  Once we identified the gonadal vein and ureter were then traced this to the renal pelvis. We did not identify a crossing vessel as the cause for the UPJ obstruction but there was an area of dense scar at the UPJ.  We then sharply ligated the ureter and then removed the area of scar with sharp dissection. We the spatulated the ureter on the posterior aspect. The distance between the ureter and the pelvis was 2cm so we elected to proceed with a vertical flap. We made a spiral flap in the renal pelvis and brought the flap to the ureter. We the attached the ureter to the renal pelvis with a 3-0 V-lock in a running fashion. We then brought the stent on a zipwire into the operative field. We advanced the stent down to the bladder. We then removed the wire. We then placed the proximal curl of the stent into the renal pelvis. We then closed the incision in the renal pelvis with a running 3-0 V-lock. We then places a JP drain in the left lower quadrant robot port. We then removed our instruments, undocked the robot, and released the pneumoperitoneum.  We then closed the midline port with a 0 Vicryl in a interrupted fashion. The remaining skin incisions were then subcuticularly closed with 4-0 Monocryl.  We then placed Dermabond over all the incisions.  This included the procedure which resulted by the patient. The assistant was utilized for retraction, suction, passing suture, cutting suture  and passing the stent with wire   Complications: None   Condition: Stable, x-rayed, transferred to PACU.   Plan: Patient is to be admitted for inpatient stay. The foley catheter will be removed in 2 days. They will be started on a clear liquid diet POD#1. The stent will remain in place for 6 weeks

## 2022-11-01 NOTE — Anesthesia Postprocedure Evaluation (Signed)
Anesthesia Post Note  Patient: Sophia Young  Procedure(s) Performed: XI ROBOTIC ASSISTED PYELOPLASTY WITH URETERAL STENT PLACEMENT (Left: Abdomen)  Patient location during evaluation: Phase II Anesthesia Type: General Level of consciousness: awake Pain management: pain level controlled Vital Signs Assessment: post-procedure vital signs reviewed and stable Respiratory status: spontaneous breathing and respiratory function stable Cardiovascular status: blood pressure returned to baseline and stable Postop Assessment: no headache and no apparent nausea or vomiting Anesthetic complications: no Comments: Late entry   No notable events documented.   Last Vitals:  Vitals:   11/01/22 1415 11/01/22 1427  BP: (!) 140/65   Pulse: 84 88  Resp: 13 18  Temp:    SpO2: 100% 100%    Last Pain:  Vitals:   11/01/22 1427  PainSc: [place]

## 2022-11-01 NOTE — H&P (Signed)
HPI: Sophia Young is a 67yo here for followup for a left UPJ obstruction. Lasix renogram showed a dilated left renal collecting system with delayed washout of the tracer. Normal bilateral renal uptake. UA today is concerning for infection. She was treated for a UTI 1 month ago. She has worsening urinary urgency, frequency and occasional dysuria.      PMH:     Past Medical History:  Diagnosis Date   Chronic back pain     Complication of anesthesia     Diabetes mellitus without complication ([place])     History of kidney stones     Hydronephrosis of left kidney      chronic   Hypercholesteremia     Hypertension     PONV (postoperative nausea and vomiting)     Ureteral stricture, left        Surgical History:      Past Surgical History:  Procedure Laterality Date   ABDOMINAL HYSTERECTOMY       BACK SURGERY       BIOPSY   06/01/2017    Procedure: BIOPSY;  Surgeon: Daneil Dolin, MD;  [facility];  Service: Endoscopy;;  colon   CHOLECYSTECTOMY       COLONOSCOPY N/A 06/01/2017    Procedure: COLONOSCOPY;  Surgeon: Daneil Dolin, MD;  [facility]: [facility];  Service: Endoscopy;  Laterality: N/A;  7:30 AM   COLONOSCOPY WITH PROPOFOL N/A 02/27/2021    Procedure: COLONOSCOPY WITH PROPOFOL;  Surgeon: Eloise Harman, DO;  [facility];  Service: Endoscopy;  Laterality: N/A;  10:30am   CYSTOSCOPY W/ RETROGRADES Bilateral 11/06/2020    Procedure: CYSTOSCOPY WITH RETROGRADE PYELOGRAM;  Surgeon: Cleon Gustin, MD;  Location: [facility];  Service: Urology;  Laterality: Bilateral;   CYSTOSCOPY W/ URETERAL STENT PLACEMENT Left 11/06/2020    Procedure: CYSTOSCOPY WITH STENT REPLACEMENT;  Surgeon: Cleon Gustin, MD;  Location: [facility];  Service: Urology;  Laterality: Left;   FLEXIBLE SIGMOIDOSCOPY N/A 06/03/2021    Procedure: FLEXIBLE SIGMOIDOSCOPY;  Surgeon: Ileana Roup, MD;  Location: [facility];  Service: General;  Laterality: N/A;   POLYPECTOMY   06/01/2017     Procedure: POLYPECTOMY;  Surgeon: Daneil Dolin, MD;  [facility];  Service: Endoscopy;;  colon     SP DIL URETER Left 2011   URETERAL STENT PLACEMENT Left 2011   URETEROSCOPY Left 11/06/2020    Procedure: URETEROSCOPY- diagnostic;  Surgeon: Cleon Gustin, MD;  Location: [facility];  Service: Urology;  Laterality: Left;      Home Medications:  Allergies as of 08/09/2022   No Known Allergies         Medication List           Accurate as of August 09, 2022  9:57 AM. If you have any questions, ask your nurse or doctor.              aspirin 81 MG chewable tablet Chew 81 mg by mouth daily.    Centrum Silver 50+Women Tabs Take 1 tablet by mouth 4 (four) times a week.    cholecalciferol 25 MCG (1000 UNIT) tablet Commonly known as: VITAMIN D3 Take 1,000 Units by mouth every 14 (fourteen) days.    docusate sodium 100 MG capsule Commonly known as: COLACE Take 100 mg by mouth daily.    levothyroxine 75 MCG tablet Commonly known as: SYNTHROID Take 75 mcg by mouth daily before breakfast.    losartan 25 MG  tablet Commonly known as: COZAAR Take 50 mg by mouth daily.    pravastatin 40 MG tablet Commonly known as: PRAVACHOL Take 40 mg by mouth daily.    sulfamethoxazole-trimethoprim 800-160 MG tablet Commonly known as: BACTRIM DS Take 1 tablet by mouth 2 (two) times daily.    triamterene-hydrochlorothiazide 37.5-25 MG capsule Commonly known as: DYAZIDE Take 1 capsule by mouth daily.    Xigduo XR 06-999 MG Tb24 Generic drug: Dapagliflozin Pro-metFORMIN ER Take 1 tablet by mouth daily.             Allergies: No Known Allergies   Family History:      Family History  Problem Relation Age of Onset   Prostate cancer Brother     Crohn's disease Cousin     Colon cancer Neg Hx        Social History:  reports that she has quit smoking. Her smoking use included cigarettes. She has a 1.50 pack-year smoking history. She has never used smokeless  tobacco. She reports that she does not drink alcohol and does not use drugs.   ROS: All other review of systems were reviewed and are negative except what is noted above in HPI   Physical Exam: BP (!) 153/84   Pulse 74   Constitutional:  Alert and oriented, No acute distress. HEENT: [place] AT, moist mucus membranes.  Trachea midline, no masses. Cardiovascular: No clubbing, cyanosis, or edema. Respiratory: Normal respiratory effort, no increased work of breathing. GI: Abdomen is soft, nontender, nondistended, no abdominal masses GU: No CVA tenderness.  Lymph: No cervical or inguinal lymphadenopathy. Skin: No rashes, bruises or suspicious lesions. Neurologic: Grossly intact, no focal deficits, moving all 4 extremities. Psychiatric: Normal mood and affect.   Laboratory Data: Recent Labs       Lab Results  Component Value Date    WBC 10.2 06/06/2021    HGB 12.8 06/06/2021    HCT 37.5 06/06/2021    MCV 90.4 06/06/2021    PLT 195 06/06/2021        Recent Labs       Lab Results  Component Value Date    CREATININE 0.99 06/06/2021        Recent Labs  No results found for: "PSA"     Recent Labs  No results found for: "TESTOSTERONE"     Recent Labs       Lab Results  Component Value Date    HGBA1C 7.4 (H) 04/06/2021        Urinalysis Labs (Brief)          Component Value Date/Time    COLORURINE BROWN (A) 10/26/2020 1622    APPEARANCEUR Cloudy (A) 07/14/2022 0856    LABSPEC 1.023 10/26/2020 1622    PHURINE 6.0 10/26/2020 1622    GLUCOSEU 3+ (A) 07/14/2022 0856    HGBUR LARGE (A) 10/26/2020 1622    BILIRUBINUR Negative 07/14/2022 0856    KETONESUR NEGATIVE 10/26/2020 1622    PROTEINUR 2+ (A) 07/14/2022 0856    PROTEINUR >=300 (A) 10/26/2020 1622    UROBILINOGEN 4.0 (H) 01/22/2010 0859    NITRITE Negative 07/14/2022 0856    NITRITE NEGATIVE 10/26/2020 1622    LEUKOCYTESUR 3+ (A) 07/14/2022 0856    LEUKOCYTESUR MODERATE (A) 10/26/2020 1622        Recent  Labs       Lab Results  Component Value Date    LABMICR See below: 07/14/2022    WBCUA >30 (H) 07/14/2022    LABEPIT 0-10 07/14/2022  MUCUS Present 12/19/2020    BACTERIA Many (A) 07/14/2022        Pertinent Imaging: Renogram 07/21/2022: Images reviewed and discussed with the patient Results for orders placed during the hospital encounter of 02/10/10   DG Abd 1 View   Narrative Clinical Data: Left renal calculus   ABDOMEN - 1 VIEW   Comparison: None Correlation:  CT abdomen pelvis 01/22/2010   Findings: Surgical clip left pelvis. Elongated calcification left pelvis, corresponding to a vascular calcification seen on preceding CT. Facet degenerative changes lower lumbar spine. No definite urinary tract calcification identified. Bowel gas pattern normal. No acute bony findings.   IMPRESSION: No definite urinary tract calcification identified.   Provider: Jennye Boroughs   No results found for this or any previous visit.   No results found for this or any previous visit.   No results found for this or any previous visit.   Results for orders placed during the hospital encounter of 07/07/22   US RENAL   Narrative CLINICAL DATA:  Congenital LEFT UPJ obstruction   EXAM: RENAL / URINARY TRACT ULTRASOUND COMPLETE   COMPARISON:  05/29/2021   FINDINGS: Right Kidney:   Renal measurements: 11.3 x 5.1 x 5.6 cm = volume: 168 mL. Normal cortical thickness and echogenicity. No mass, hydronephrosis, or shadowing calcification   Left Kidney:   Renal measurements: 14.2 x 6.6 x 5.8 cm = volume: 284 ML. Cortical thinning. Upper normal cortical echogenicity. Severe hydronephrosis with associated cortical thinning. No mass or shadowing calcification. Persistence of LEFT hydronephrosis after voiding.   Bladder:   RIGHT ureteral jet visualized. Bladder otherwise unremarkable. No wall thickening or mass seen.   Other:   None.   IMPRESSION: Severe LEFT  hydronephrosis and associated LEFT renal cortical thinning, slightly increased from previous study.   Remainder of exam unremarkable.     Electronically Signed By: Lavonia Dana M.D. On: 07/08/2022 18:36   No valid procedures specified. No results found for this or any previous visit.   No results found for this or any previous visit.     Assessment & Plan:     1. UPJ obstruction, congenital -We discussed the management including observation, chronic stent placement, and robotic pyeloplasty. After discussing the options the patient elects for robotic pyeloplasty. Risks/benefits/alternatives discussed - Urinalysis, Routine w reflex microscopic    No follow-ups on file.   Nicolette Bang, MD   [facility] [place]

## 2022-11-01 NOTE — Anesthesia Preprocedure Evaluation (Signed)
Anesthesia Evaluation  Patient identified by MRN, date of birth, ID band Patient awake    Reviewed: Allergy & Precautions, H&P , NPO status , Patient's Chart, lab work & pertinent test results, reviewed documented beta blocker date and time   History of Anesthesia Complications (+) PONV and history of anesthetic complications  Airway Mallampati: II  TM Distance: >3 FB Neck ROM: full    Dental no notable dental hx.    Pulmonary neg pulmonary ROS, former smoker   Pulmonary exam normal breath sounds clear to auscultation       Cardiovascular Exercise Tolerance: Good hypertension, negative cardio ROS  Rhythm:regular Rate:Normal     Neuro/Psych negative neurological ROS  negative psych ROS   GI/Hepatic negative GI ROS, Neg liver ROS,,,  Endo/Other  negative endocrine ROSdiabetes    Renal/GU CRFRenal disease  negative genitourinary   Musculoskeletal   Abdominal   Peds  Hematology negative hematology ROS (+)   Anesthesia Other Findings   Reproductive/Obstetrics negative OB ROS                             Anesthesia Physical Anesthesia Plan  ASA: 2  Anesthesia Plan: General and General ETT   Post-op Pain Management:    Induction:   PONV Risk Score and Plan: Ondansetron  Airway Management Planned:   Additional Equipment:   Intra-op Plan:   Post-operative Plan:   Informed Consent: I have reviewed the patients History and Physical, chart, labs and discussed the procedure including the risks, benefits and alternatives for the proposed anesthesia with the patient or authorized representative who has indicated his/her understanding and acceptance.     Dental Advisory Given  Plan Discussed with: CRNA  Anesthesia Plan Comments:        Anesthesia Quick Evaluation

## 2022-11-01 NOTE — Transfer of Care (Signed)
Immediate Anesthesia Transfer of Care Note  Patient: Sophia Young  Procedure(s) Performed: XI ROBOTIC ASSISTED PYELOPLASTY WITH URETERAL STENT PLACEMENT (Left: Abdomen)  Patient Location: PACU  Anesthesia Type:General  Level of Consciousness: awake  Airway & Oxygen Therapy: Patient Spontanous Breathing and Patient connected to face mask oxygen  Post-op Assessment: Report given to RN and Post -op Vital signs reviewed and stable  Post vital signs: Reviewed and stable  Last Vitals:  Vitals Value Taken Time  BP 151/69 11/01/22 1404  Temp    Pulse 104 11/01/22 1407  Resp 17 11/01/22 1407  SpO2 100 % 11/01/22 1407  Vitals shown include unvalidated device data.  Last Pain:  Vitals:   11/01/22 1000  PainSc: 0-No pain         Complications: No notable events documented.

## 2022-11-01 NOTE — Anesthesia Procedure Notes (Signed)
Procedure Name: Intubation Date/Time: 11/01/2022 10:59 AM  Performed by: Orlie Dakin, CRNAPre-anesthesia Checklist: Patient identified, Emergency Drugs available, Suction available and Patient being monitored Patient Re-evaluated:Patient Re-evaluated prior to induction Oxygen Delivery Method: Circle system utilized Preoxygenation: Pre-oxygenation with 100% oxygen Induction Type: IV induction Ventilation: Mask ventilation without difficulty Laryngoscope Size: Miller and 3 Grade View: Grade I Tube type: Oral Tube size: 7.5 mm Number of attempts: 1 Airway Equipment and Method: Stylet Placement Confirmation: ETT inserted through vocal cords under direct vision, positive ETCO2 and breath sounds checked- equal and bilateral Secured at: 22 cm Tube secured with: Tape Dental Injury: Teeth and Oropharynx as per pre-operative assessment  Comments: 4x4s bite block used at end of proc.

## 2022-11-02 LAB — BASIC METABOLIC PANEL
Anion gap: 8 (ref 5–15)
BUN: 20 mg/dL (ref 8–23)
CO2: 24 mmol/L (ref 22–32)
Calcium: 8.2 mg/dL — ABNORMAL LOW (ref 8.9–10.3)
Chloride: 104 mmol/L (ref 98–111)
Creatinine, Ser: 0.83 mg/dL (ref 0.44–1.00)
GFR, Estimated: >60 mL/min (ref >60)
Glucose, Bld: 134 mg/dL — ABNORMAL HIGH (ref 70–99)
Potassium: 3.7 mmol/L (ref 3.5–5.1)
Sodium: 136 mmol/L (ref 135–145)

## 2022-11-02 LAB — CBC
HCT: 35.5 % — ABNORMAL LOW (ref 36.0–46.0)
Hemoglobin: 11.8 g/dL — ABNORMAL LOW (ref 12.0–15.0)
MCH: 31.3 pg (ref 26.0–34.0)
MCHC: 33.2 g/dL (ref 30.0–36.0)
MCV: 94.2 fL (ref 80.0–100.0)
Platelets: 240 10*3/uL (ref 150–400)
RBC: 3.77 MIL/uL — ABNORMAL LOW (ref 3.87–5.11)
RDW: 13.9 % (ref 11.5–15.5)
WBC: 13.2 10*3/uL — ABNORMAL HIGH (ref 4.0–10.5)
nRBC: 0 % (ref 0.0–0.2)

## 2022-11-02 LAB — GLUCOSE, CAPILLARY
Glucose-Capillary: 121 mg/dL — ABNORMAL HIGH (ref 70–99)
Glucose-Capillary: 102 mg/dL — ABNORMAL HIGH (ref 70–99)
Glucose-Capillary: 104 mg/dL — ABNORMAL HIGH (ref 70–99)
Glucose-Capillary: 125 mg/dL — ABNORMAL HIGH (ref 70–99)

## 2022-11-02 MED ORDER — CHLORHEXIDINE GLUCONATE CLOTH 2 % EX PADS
6.0000 | MEDICATED_PAD | Freq: Every day | CUTANEOUS | Status: DC
  Administered 2022-11-02 – 2022-11-05 (×4): 6 via TOPICAL

## 2022-11-02 NOTE — Progress Notes (Signed)
  Transition of Care [facility]) Screening Note   Patient Details  Name: Sophia Young Date of Birth: 07-02-1956   Transition of Care [facility]) CM/SW Contact:    Iona Beard, [place] Phone Number: 11/02/2022, 10:08 AM    Transition of Care Department [facility]) has reviewed patient and no TOC needs have been identified at this time. We will continue to monitor patient advancement through interdisciplinary progression rounds. If new patient transition needs arise, please place a TOC consult.

## 2022-11-02 NOTE — Progress Notes (Signed)
Pt walked one assist to door, currently up and in chair.

## 2022-11-02 NOTE — Plan of Care (Signed)
  Problem: Health Behavior/Discharge Planning: Goal: Ability to manage health-related needs will improve Outcome: Progressing   

## 2022-11-02 NOTE — Progress Notes (Signed)
1 Day Post-Op Subjective: Patient reports mild incisional pain. JP drain 80cc. Urine light pink. Negative flatus. Tolerating clears  Objective: Vital signs in last 24 hours: Temp:  [97.5 F (36.4 C)-99 F (37.2 C)] 99 F (37.2 C) (02/20 0504) Pulse Rate:  [68-113] 73 (02/20 0504) Resp:  [6-20] 18 (02/20 0504) BP: (99-165)/(52-81) 134/52 (02/20 0504) SpO2:  [95 %-100 %] 98 % (02/20 0504)  Intake/Output from previous day: 02/19 0701 - 02/20 0700 In: 2847.5 [I.V.:2747.5; IV Piggyback:100] Out: 1080 [Urine:975; Drains:80; Blood:25] Intake/Output this shift: No intake/output data recorded.  Physical Exam:  General:alert, cooperative, and appears stated age GI: soft, non tender, normal bowel sounds, no palpable masses, no organomegaly, no inguinal hernia Female genitalia: not done Extremities: extremities normal, atraumatic, no cyanosis or edema  Lab Results: Recent Labs    11/01/22 1659 11/02/22 0452  HGB 13.1 11.8*  HCT 38.8 35.5*   BMET Recent Labs    11/01/22 1659 11/02/22 0452  NA 139 136  K 3.4* 3.7  CL 105 104  CO2 23 24  GLUCOSE 133* 134*  BUN 21 20  CREATININE 0.90 0.83  CALCIUM 8.7* 8.2*   No results for input(s): "LABPT", "INR" in the last 72 hours. No results for input(s): "LABURIN" in the last 72 hours. Results for orders placed or performed in visit on 09/01/22  Microscopic Examination     Status: Abnormal   Collection Time: 09/01/22  1:44 PM   Urine  Result Value Ref Range Status   WBC, UA >30 (A) 0 - 5 /hpf Final   RBC, Urine 3-10 (A) 0 - 2 /hpf Final   Epithelial Cells (non renal) 0-10 0 - 10 /hpf Final   Bacteria, UA Moderate (A) None seen/Few Final  Urine Culture     Status: Abnormal   Collection Time: 09/01/22  1:45 PM   Specimen: Urine   UR  Result Value Ref Range Status   Urine Culture, Routine Final report (A)  Final   Organism ID, Bacteria Escherichia coli (A)  Final    Comment: Greater than 100,000 colony forming units per mL    Antimicrobial Susceptibility Comment  Final    Comment:       ** S = Susceptible; I = Intermediate; R = Resistant **                    P = Positive; N = Negative             MICS are expressed in micrograms per mL    Antibiotic                 RSLT#1    RSLT#2    RSLT#3    RSLT#4 Amoxicillin/Clavulanic Acid    S Ampicillin                     R Cefepime                       S Ceftriaxone                    S Cefuroxime                     I Ciprofloxacin                  S Ertapenem  S Gentamicin                     S Imipenem                       S Levofloxacin                   S Meropenem                      S Nitrofurantoin                 S Piperacillin/Tazobactam        S Tetracycline                   R Tobramycin                     S Trimethoprim/Sulfa             S     Studies/Results: No results found.  Assessment/Plan: POD#1 left robotic pyeloplasty  Continue foley catheter and JP Ambulate in halls with assistance Continue current pain control regiment   LOS: 1 day   Nicolette Bang 11/02/2022, 9:45 AM

## 2022-11-03 LAB — GLUCOSE, CAPILLARY
Glucose-Capillary: 295 mg/dL — ABNORMAL HIGH (ref 70–99)
Glucose-Capillary: 126 mg/dL — ABNORMAL HIGH (ref 70–99)
Glucose-Capillary: 106 mg/dL — ABNORMAL HIGH (ref 70–99)
Glucose-Capillary: 132 mg/dL — ABNORMAL HIGH (ref 70–99)

## 2022-11-03 LAB — SURGICAL PATHOLOGY

## 2022-11-03 NOTE — Progress Notes (Signed)
Foley removed without complication. Peri care completed before and after removal. Pt denies any pain at this time.

## 2022-11-03 NOTE — Progress Notes (Signed)
Pt has been alert and orientated x4. Pts JP drain dressing has been changed due to  being saturated with blood. Pt has denied any pain today and has been up and walking in the hallway several times throughout shift.

## 2022-11-03 NOTE — Plan of Care (Signed)
  Problem: Coping: Goal: Ability to adjust to condition or change in health will improve Outcome: Progressing   

## 2022-11-04 LAB — GLUCOSE, CAPILLARY
Glucose-Capillary: 115 mg/dL — ABNORMAL HIGH (ref 70–99)
Glucose-Capillary: 112 mg/dL — ABNORMAL HIGH (ref 70–99)
Glucose-Capillary: 151 mg/dL — ABNORMAL HIGH (ref 70–99)
Glucose-Capillary: 131 mg/dL — ABNORMAL HIGH (ref 70–99)

## 2022-11-04 NOTE — Progress Notes (Signed)
3 Days Post-Op Subjective: Patient reports mild incisional pain. JP drain 50cc. Urine light pink. Negative flatus. Tolerating clears  Objective: Vital signs in last 24 hours: Temp:  [98.2 F (36.8 C)-99.5 F (37.5 C)] 98.2 F (36.8 C) (02/22 0536) Pulse Rate:  [73-83] 73 (02/22 0536) Resp:  [16-20] 16 (02/22 0536) BP: (122-132)/(50-62) 122/57 (02/22 0536) SpO2:  [93 %-98 %] 93 % (02/22 0536)  Intake/Output from previous day: 02/21 0701 - 02/22 0700 In: 120 [P.O.:120] Out: 10 [Drains:10] Intake/Output this shift: Total I/O In: 75 [I.V.:75] Out: -   Physical Exam:  General:alert, cooperative, and appears stated age GI: soft, non tender, normal bowel sounds, no palpable masses, no organomegaly, no inguinal hernia Female genitalia: not done Extremities: extremities normal, atraumatic, no cyanosis or edema  Lab Results: Recent Labs    11/01/22 1659 11/02/22 0452  HGB 13.1 11.8*  HCT 38.8 35.5*   BMET Recent Labs    11/01/22 1659 11/02/22 0452  NA 139 136  K 3.4* 3.7  CL 105 104  CO2 23 24  GLUCOSE 133* 134*  BUN 21 20  CREATININE 0.90 0.83  CALCIUM 8.7* 8.2*   No results for input(s): "LABPT", "INR" in the last 72 hours. No results for input(s): "LABURIN" in the last 72 hours. Results for orders placed or performed in visit on 09/01/22  Microscopic Examination     Status: Abnormal   Collection Time: 09/01/22  1:44 PM   Urine  Result Value Ref Range Status   WBC, UA >30 (A) 0 - 5 /hpf Final   RBC, Urine 3-10 (A) 0 - 2 /hpf Final   Epithelial Cells (non renal) 0-10 0 - 10 /hpf Final   Bacteria, UA Moderate (A) None seen/Few Final  Urine Culture     Status: Abnormal   Collection Time: 09/01/22  1:45 PM   Specimen: Urine   UR  Result Value Ref Range Status   Urine Culture, Routine Final report (A)  Final   Organism ID, Bacteria Escherichia coli (A)  Final    Comment: Greater than 100,000 colony forming units per mL   Antimicrobial Susceptibility Comment   Final    Comment:       ** S = Susceptible; I = Intermediate; R = Resistant **                    P = Positive; N = Negative             MICS are expressed in micrograms per mL    Antibiotic                 RSLT#1    RSLT#2    RSLT#3    RSLT#4 Amoxicillin/Clavulanic Acid    S Ampicillin                     R Cefepime                       S Ceftriaxone                    S Cefuroxime                     I Ciprofloxacin                  S Ertapenem  S Gentamicin                     S Imipenem                       S Levofloxacin                   S Meropenem                      S Nitrofurantoin                 S Piperacillin/Tazobactam        S Tetracycline                   R Tobramycin                     S Trimethoprim/Sulfa             S     Studies/Results: No results found.  Assessment/Plan: POD#2 left robotic pyeloplasty  Continue JP, discontinue foley Ambulate in halls with assistance Continue current pain control regiment Advanced diet to regular diet   LOS: 3 days   Nicolette Bang 11/04/2022, 11:39 AM

## 2022-11-04 NOTE — Plan of Care (Signed)
  Problem: Education: Goal: Ability to describe self-care measures that may prevent or decrease complications (Diabetes Survival Skills Education) will improve Outcome: Progressing   

## 2022-11-04 NOTE — Progress Notes (Signed)
1 Day Post-Op Subjective: Patient reports mild incisional pain. JP drain 10cc. Urine clear positive flatus. Tolerating regular diet  Objective: Vital signs in last 24 hours: Temp:  [97.5 F (36.4 C)-99 F (37.2 C)] 99 F (37.2 C) (02/20 0504) Pulse Rate:  [68-113] 73 (02/20 0504) Resp:  [6-20] 18 (02/20 0504) BP: (99-165)/(52-81) 134/52 (02/20 0504) SpO2:  [95 %-100 %] 98 % (02/20 0504)  Intake/Output from previous day: 02/19 0701 - 02/20 0700 In: 2847.5 [I.V.:2747.5; IV Piggyback:100] Out: 1080 [Urine:975; Drains:80; Blood:25] Intake/Output this shift: No intake/output data recorded.  Physical Exam:  General:alert, cooperative, and appears stated age GI: soft, non tender, normal bowel sounds, no palpable masses, no organomegaly, no inguinal hernia Female genitalia: not done Extremities: extremities normal, atraumatic, no cyanosis or edema  Lab Results: Recent Labs    11/01/22 1659 11/02/22 0452  HGB 13.1 11.8*  HCT 38.8 35.5*   BMET Recent Labs    11/01/22 1659 11/02/22 0452  NA 139 136  K 3.4* 3.7  CL 105 104  CO2 23 24  GLUCOSE 133* 134*  BUN 21 20  CREATININE 0.90 0.83  CALCIUM 8.7* 8.2*   No results for input(s): "LABPT", "INR" in the last 72 hours. No results for input(s): "LABURIN" in the last 72 hours. Results for orders placed or performed in visit on 09/01/22  Microscopic Examination     Status: Abnormal   Collection Time: 09/01/22  1:44 PM   Urine  Result Value Ref Range Status   WBC, UA >30 (A) 0 - 5 /hpf Final   RBC, Urine 3-10 (A) 0 - 2 /hpf Final   Epithelial Cells (non renal) 0-10 0 - 10 /hpf Final   Bacteria, UA Moderate (A) None seen/Few Final  Urine Culture     Status: Abnormal   Collection Time: 09/01/22  1:45 PM   Specimen: Urine   UR  Result Value Ref Range Status   Urine Culture, Routine Final report (A)  Final   Organism ID, Bacteria Escherichia coli (A)  Final    Comment: Greater than 100,000 colony forming units per mL    Antimicrobial Susceptibility Comment  Final    Comment:       ** S = Susceptible; I = Intermediate; R = Resistant **                    P = Positive; N = Negative             MICS are expressed in micrograms per mL    Antibiotic                 RSLT#1    RSLT#2    RSLT#3    RSLT#4 Amoxicillin/Clavulanic Acid    S Ampicillin                     R Cefepime                       S Ceftriaxone                    S Cefuroxime                     I Ciprofloxacin                  S Ertapenem  S Gentamicin                     S Imipenem                       S Levofloxacin                   S Meropenem                      S Nitrofurantoin                 S Piperacillin/Tazobactam        S Tetracycline                   R Tobramycin                     S Trimethoprim/Sulfa             S     Studies/Results: No results found.  Assessment/Plan: POD#3 left robotic pyeloplasty  Continue  JP Ambulate in halls with assistance Continue current pain control regiment    Nicolette Bang

## 2022-11-05 ENCOUNTER — Encounter (HOSPITAL_COMMUNITY): Payer: Self-pay | Admitting: Urology

## 2022-11-05 LAB — GLUCOSE, CAPILLARY: Glucose-Capillary: 111 mg/dL — ABNORMAL HIGH (ref 70–99)

## 2022-11-05 MED ORDER — ONDANSETRON HCL 4 MG PO TABS: 4.0000 mg | ORAL_TABLET | Freq: Every day | ORAL | 1 refills | Status: DC | PRN

## 2022-11-05 MED ORDER — OXYCODONE-ACETAMINOPHEN 5-325 MG PO TABS: 1.0000 | ORAL_TABLET | ORAL | 0 refills | Status: DC | PRN

## 2022-11-05 NOTE — Progress Notes (Signed)
Nsg Discharge Note  Admit Date:  11/01/2022 Discharge date: 11/05/2022   Sophia Young to be D/C'd Home per MD order.  AVS completed.  Copy for chart, and copy for patient signed, and dated. Patient/caregiver able to verbalize understanding.  Discharge Medication: Allergies as of 11/05/2022   No Known Allergies      Medication List     TAKE these medications    aspirin 81 MG chewable tablet Chew 81 mg by mouth daily.   Centrum Silver 50+Women Tabs Take 1 tablet by mouth 4 (four) times a week.   cholecalciferol 25 MCG (1000 UNIT) tablet Commonly known as: VITAMIN D3 Take 1,000 Units by mouth 4 (four) times a week.   levothyroxine 75 MCG tablet Commonly known as: SYNTHROID Take 75 mcg by mouth daily before breakfast.   losartan 25 MG tablet Commonly known as: COZAAR Take 50 mg by mouth daily.   ondansetron 4 MG tablet Commonly known as: Zofran Take 1 tablet (4 mg total) by mouth daily as needed for nausea or vomiting.   oxyCODONE-acetaminophen 5-325 MG tablet Commonly known as: Percocet Take 1 tablet by mouth every 4 (four) hours as needed for severe pain.   Ozempic (1 MG/DOSE) 4 MG/3ML Sopn Generic drug: Semaglutide (1 MG/DOSE) Inject 1 mg into the skin every 7 (seven) days.   pravastatin 40 MG tablet Commonly known as: PRAVACHOL Take 40 mg by mouth daily.   sulfamethoxazole-trimethoprim 800-160 MG tablet Commonly known as: BACTRIM DS Take 1 tablet by mouth every 12 (twelve) hours.   sulfamethoxazole-trimethoprim 800-160 MG tablet Commonly known as: BACTRIM DS Take 1 tablet by mouth daily. To start daily after completing the bactrim twice daily for 1 week.  Please continue daily until 1 week after surgery.   triamterene-hydrochlorothiazide 37.5-25 MG capsule Commonly known as: DYAZIDE Take 1 capsule by mouth daily.   Xigduo XR 06-999 MG Tb24 Generic drug: Dapagliflozin Pro-metFORMIN ER Take 1 tablet by mouth daily.        Discharge  Assessment: Vitals:   11/04/22 2251 11/05/22 0534  BP: (!) 128/56 (!) 123/53  Pulse: 69 63  Resp: 19 18  Temp: 98.8 F (37.1 C) 98.2 F (36.8 C)  SpO2: 97% 100%   Skin clean, dry and intact without evidence of skin break down, no evidence of skin tears noted. IV catheter discontinued intact. Site without signs and symptoms of complications - no redness or edema noted at insertion site, patient denies c/o pain - only slight tenderness at site.  Dressing with slight pressure applied.  D/c Instructions-Education: Discharge instructions given to patient/family with verbalized understanding. D/c education completed with patient/family including follow up instructions, medication list, d/c activities limitations if indicated, with other d/c instructions as indicated by MD - patient able to verbalize understanding, all questions fully answered. Patient instructed to return to ED, call 911, or call MD for any changes in condition.  Patient escorted via [place], and D/C home via private auto.  Clovis Fredrickson, LPN [phone] AM

## 2022-11-05 NOTE — Care Management Important Message (Signed)
Important Message  Patient Details  Name: Sophia Young MRN: [id number] Date of Birth: 27-Jan-1956   Medicare Important Message Given:  Yes     Tommy Medal 11/05/2022, 9:57 AM

## 2022-11-08 ENCOUNTER — Ambulatory Visit (INDEPENDENT_AMBULATORY_CARE_PROVIDER_SITE_OTHER): Payer: PPO | Admitting: Urology

## 2022-11-08 ENCOUNTER — Encounter: Payer: Self-pay | Admitting: Urology

## 2022-11-08 VITALS — BP 132/77 | HR 66

## 2022-11-08 DIAGNOSIS — Q6239 Other obstructive defects of renal pelvis and ureter: Secondary | ICD-10-CM

## 2022-11-08 NOTE — Progress Notes (Unsigned)
11/08/2022 10:56 AM   Sophia Young [birth year] [id number]  Referring provider: Celene Squibb, MD [address] [name],  [place] [address]  No chief complaint on file.   HPI:    PMH: Past Medical History:  Diagnosis Date   Chronic back pain    Complication of anesthesia    Diabetes mellitus without complication ([place])    History of kidney stones    Hydronephrosis of left kidney    chronic   Hypercholesteremia    Hypertension    PONV (postoperative nausea and vomiting)    Ureteral stricture, left     Surgical History: Past Surgical History:  Procedure Laterality Date   ABDOMINAL HYSTERECTOMY     BACK SURGERY     BIOPSY  06/01/2017   Procedure: BIOPSY;  Surgeon: Daneil Dolin, MD;  [facility];  Service: Endoscopy;;  colon   CHOLECYSTECTOMY     COLONOSCOPY N/A 06/01/2017   Procedure: COLONOSCOPY;  Surgeon: Daneil Dolin, MD;  [facility];  Service: Endoscopy;  Laterality: N/A;  7:30 AM   COLONOSCOPY WITH PROPOFOL N/A 02/27/2021   Procedure: COLONOSCOPY WITH PROPOFOL;  Surgeon: Eloise Harman, DO;  [facility];  Service: Endoscopy;  Laterality: N/A;  10:30am   CYSTOSCOPY W/ RETROGRADES Bilateral 11/06/2020   Procedure: CYSTOSCOPY WITH RETROGRADE PYELOGRAM;  Surgeon: Cleon Gustin, MD;  Location: [facility];  Service: Urology;  Laterality: Bilateral;   CYSTOSCOPY W/ URETERAL STENT PLACEMENT Left 11/06/2020   Procedure: CYSTOSCOPY WITH STENT REPLACEMENT;  Surgeon: Cleon Gustin, MD;  Location: [facility];  Service: Urology;  Laterality: Left;   FLEXIBLE SIGMOIDOSCOPY N/A 06/03/2021   Procedure: FLEXIBLE SIGMOIDOSCOPY;  Surgeon: Ileana Roup, MD;  Location: [facility];  Service: General;  Laterality: N/A;   POLYPECTOMY  06/01/2017   Procedure: POLYPECTOMY;  Surgeon: Daneil Dolin, MD;  [facility];  Service: Endoscopy;;  colon    ROBOT ASSISTED PYELOPLASTY Left 11/01/2022   Procedure: XI ROBOTIC ASSISTED  PYELOPLASTY WITH URETERAL STENT PLACEMENT;  Surgeon: Cleon Gustin, MD;  Location: [facility];  Service: Urology;  Laterality: Left;   SP DIL URETER Left 2011   URETERAL STENT PLACEMENT Left 2011   URETEROSCOPY Left 11/06/2020   Procedure: URETEROSCOPY- diagnostic;  Surgeon: Cleon Gustin, MD;  Location: [facility];  Service: Urology;  Laterality: Left;    Home Medications:  Allergies as of 11/08/2022   No Known Allergies      Medication List        Accurate as of November 08, 2022 10:56 AM. If you have any questions, ask your nurse or doctor.          aspirin 81 MG chewable tablet Chew 81 mg by mouth daily.   Centrum Silver 50+Women Tabs Take 1 tablet by mouth 4 (four) times a week.   cholecalciferol 25 MCG (1000 UNIT) tablet Commonly known as: VITAMIN D3 Take 1,000 Units by mouth 4 (four) times a week.   levothyroxine 75 MCG tablet Commonly known as: SYNTHROID Take 75 mcg by mouth daily before breakfast.   losartan 25 MG tablet Commonly known as: COZAAR Take 50 mg by mouth daily.   ondansetron 4 MG tablet Commonly known as: Zofran Take 1 tablet (4 mg total) by mouth daily as needed for nausea or vomiting.   oxyCODONE-acetaminophen 5-325 MG tablet Commonly known as: Percocet Take 1 tablet by mouth every 4 (four) hours as needed for severe pain.  Ozempic (1 MG/DOSE) 4 MG/3ML Sopn Generic drug: Semaglutide (1 MG/DOSE) Inject 1 mg into the skin every 7 (seven) days.   pravastatin 40 MG tablet Commonly known as: PRAVACHOL Take 40 mg by mouth daily.   sulfamethoxazole-trimethoprim 800-160 MG tablet Commonly known as: BACTRIM DS Take 1 tablet by mouth every 12 (twelve) hours.   sulfamethoxazole-trimethoprim 800-160 MG tablet Commonly known as: BACTRIM DS Take 1 tablet by mouth daily. To start daily after completing the bactrim twice daily for 1 week.  Please continue daily until 1 week after surgery.   triamterene-hydrochlorothiazide 37.5-25 MG  capsule Commonly known as: DYAZIDE Take 1 capsule by mouth daily.   Xigduo XR 06-999 MG Tb24 Generic drug: Dapagliflozin Pro-metFORMIN ER Take 1 tablet by mouth daily.        Allergies: No Known Allergies  Family History: Family History  Problem Relation Age of Onset   Prostate cancer Brother    Crohn's disease Cousin    Colon cancer Neg Hx     Social History:  reports that she has quit smoking. Her smoking use included cigarettes. She has a 1.50 pack-year smoking history. She has never used smokeless tobacco. She reports that she does not drink alcohol and does not use drugs.  ROS: All other review of systems were reviewed and are negative except what is noted above in HPI  Physical Exam: BP 132/77   Pulse 66   Constitutional:  Alert and oriented, No acute distress. HEENT: [place] AT, moist mucus membranes.  Trachea midline, no masses. Cardiovascular: No clubbing, cyanosis, or edema. Respiratory: Normal respiratory effort, no increased work of breathing. GI: Abdomen is soft, nontender, nondistended, no abdominal masses GU: No CVA tenderness.  Lymph: No cervical or inguinal lymphadenopathy. Skin: No rashes, bruises or suspicious lesions. Neurologic: Grossly intact, no focal deficits, moving all 4 extremities. Psychiatric: Normal mood and affect.  Laboratory Data: Lab Results  Component Value Date   WBC 13.2 (H) 11/02/2022   HGB 11.8 (L) 11/02/2022   HCT 35.5 (L) 11/02/2022   MCV 94.2 11/02/2022   PLT 240 11/02/2022    Lab Results  Component Value Date   CREATININE 0.83 11/02/2022    No results found for: "PSA"  No results found for: "TESTOSTERONE"  Lab Results  Component Value Date   HGBA1C 4.8 10/28/2022    Urinalysis    Component Value Date/Time   COLORURINE BROWN (A) 10/26/2020 1622   APPEARANCEUR Cloudy (A) 09/01/2022 1344   LABSPEC 1.023 10/26/2020 1622   PHURINE 6.0 10/26/2020 1622   GLUCOSEU 1+ (A) 09/01/2022 1344   HGBUR LARGE (A)  10/26/2020 1622   BILIRUBINUR Negative 09/01/2022 1344   KETONESUR NEGATIVE 10/26/2020 1622   PROTEINUR 2+ (A) 09/01/2022 1344   PROTEINUR >=300 (A) 10/26/2020 1622   UROBILINOGEN 4.0 (H) 01/22/2010 0859   NITRITE Positive (A) 09/01/2022 1344   NITRITE NEGATIVE 10/26/2020 1622   LEUKOCYTESUR 2+ (A) 09/01/2022 1344   LEUKOCYTESUR MODERATE (A) 10/26/2020 1622    Lab Results  Component Value Date   LABMICR See below: 09/01/2022   WBCUA >30 (A) 09/01/2022   LABEPIT 0-10 09/01/2022   MUCUS Present 12/19/2020   BACTERIA Moderate (A) 09/01/2022    Pertinent Imaging: *** Results for orders placed during the hospital encounter of 02/10/10  DG Abd 1 View  Narrative Clinical Data: Left renal calculus  ABDOMEN - 1 VIEW  Comparison: None Correlation:  CT abdomen pelvis 01/22/2010  Findings: Surgical clip left pelvis. Elongated calcification left pelvis, corresponding to a  vascular calcification seen on preceding CT. Facet degenerative changes lower lumbar spine. No definite urinary tract calcification identified. Bowel gas pattern normal. No acute bony findings.  IMPRESSION: No definite urinary tract calcification identified.  Provider: Jennye Boroughs  No results found for this or any previous visit.  No results found for this or any previous visit.  No results found for this or any previous visit.  Results for orders placed during the hospital encounter of 07/07/22  US RENAL  Narrative CLINICAL DATA:  Congenital LEFT UPJ obstruction  EXAM: RENAL / URINARY TRACT ULTRASOUND COMPLETE  COMPARISON:  05/29/2021  FINDINGS: Right Kidney:  Renal measurements: 11.3 x 5.1 x 5.6 cm = volume: 168 mL. Normal cortical thickness and echogenicity. No mass, hydronephrosis, or shadowing calcification  Left Kidney:  Renal measurements: 14.2 x 6.6 x 5.8 cm = volume: 284 ML. Cortical thinning. Upper normal cortical echogenicity. Severe hydronephrosis with associated  cortical thinning. No mass or shadowing calcification. Persistence of LEFT hydronephrosis after voiding.  Bladder:  RIGHT ureteral jet visualized. Bladder otherwise unremarkable. No wall thickening or mass seen.  Other:  None.  IMPRESSION: Severe LEFT hydronephrosis and associated LEFT renal cortical thinning, slightly increased from previous study.  Remainder of exam unremarkable.   Electronically Signed By: Lavonia Dana M.D. On: 07/08/2022 18:36  No valid procedures specified. No results found for this or any previous visit.  No results found for this or any previous visit.   Assessment & Plan:    1. UPJ obstruction, congenital -followup 2 weeks for wound and 5 weeks for cystoscopy and stent removal  - Urinalysis, Routine w reflex microscopic   No follow-ups on file.  Nicolette Bang, MD  [facility] [place]

## 2022-11-08 NOTE — Discharge Summary (Signed)
Physician Discharge Summary  Patient ID: Sophia Young MRN: [id number] DOB/AGE: 67-16-1957 67 y.o.  Admit date: 11/01/2022 Discharge date: 11/05/2022  Admission Diagnoses:  UPJ obstruction, congenital  Discharge Diagnoses:  Principal Problem:   UPJ obstruction, congenital   Past Medical History:  Diagnosis Date   Chronic back pain    Complication of anesthesia    Diabetes mellitus without complication ([place])    History of kidney stones    Hydronephrosis of left kidney    chronic   Hypercholesteremia    Hypertension    PONV (postoperative nausea and vomiting)    Ureteral stricture, left     Surgeries: Procedure(s): XI ROBOTIC ASSISTED PYELOPLASTY WITH URETERAL STENT PLACEMENT on 11/01/2022   Consultants (if any):   Discharged Condition: Improved  Hospital Course: Sophia Young is an 67 y.o. female who was admitted 11/01/2022 with a diagnosis of UPJ obstruction, congenital and went to the operating room on 11/01/2022 and underwent the above named procedures.    She was given perioperative antibiotics:  Anti-infectives (From admission, onward)    Start     Dose/Rate Route Frequency Ordered Stop   11/01/22 0921  ceFAZolin (ANCEF) IVPB 2g/100 mL premix        2 g 200 mL/hr over 30 Minutes Intravenous 30 min pre-op 11/01/22 0921 11/01/22 1115     .  She was given sequential compression devices, early ambulation, for DVT prophylaxis.  She benefited maximally from the hospital stay and there were no complications.    Recent vital signs:  Vitals:   11/04/22 2251 11/05/22 0534  BP: (!) 128/56 (!) 123/53  Pulse: 69 63  Resp: 19 18  Temp: 98.8 F (37.1 C) 98.2 F (36.8 C)  SpO2: 97% 100%    Recent laboratory studies:  Lab Results  Component Value Date   HGB 11.8 (L) 11/02/2022   HGB 13.1 11/01/2022   HGB 12.8 06/06/2021   Lab Results  Component Value Date   WBC 13.2 (H) 11/02/2022   PLT 240 11/02/2022   Lab Results  Component Value Date   INR 1.0  04/06/2021   Lab Results  Component Value Date   NA 136 11/02/2022   K 3.7 11/02/2022   CL 104 11/02/2022   CO2 24 11/02/2022   BUN 20 11/02/2022   CREATININE 0.83 11/02/2022   GLUCOSE 134 (H) 11/02/2022    Discharge Medications:   Allergies as of 11/05/2022   No Known Allergies      Medication List     TAKE these medications    aspirin 81 MG chewable tablet Chew 81 mg by mouth daily.   Centrum Silver 50+Women Tabs Take 1 tablet by mouth 4 (four) times a week.   cholecalciferol 25 MCG (1000 UNIT) tablet Commonly known as: VITAMIN D3 Take 1,000 Units by mouth 4 (four) times a week.   levothyroxine 75 MCG tablet Commonly known as: SYNTHROID Take 75 mcg by mouth daily before breakfast.   losartan 25 MG tablet Commonly known as: COZAAR Take 50 mg by mouth daily.   ondansetron 4 MG tablet Commonly known as: Zofran Take 1 tablet (4 mg total) by mouth daily as needed for nausea or vomiting.   oxyCODONE-acetaminophen 5-325 MG tablet Commonly known as: Percocet Take 1 tablet by mouth every 4 (four) hours as needed for severe pain.   Ozempic (1 MG/DOSE) 4 MG/3ML Sopn Generic drug: Semaglutide (1 MG/DOSE) Inject 1 mg into the skin every 7 (seven) days.   pravastatin 40 MG tablet  Commonly known as: PRAVACHOL Take 40 mg by mouth daily.   sulfamethoxazole-trimethoprim 800-160 MG tablet Commonly known as: BACTRIM DS Take 1 tablet by mouth every 12 (twelve) hours.   sulfamethoxazole-trimethoprim 800-160 MG tablet Commonly known as: BACTRIM DS Take 1 tablet by mouth daily. To start daily after completing the bactrim twice daily for 1 week.  Please continue daily until 1 week after surgery.   triamterene-hydrochlorothiazide 37.5-25 MG capsule Commonly known as: DYAZIDE Take 1 capsule by mouth daily.   Xigduo XR 06-999 MG Tb24 Generic drug: Dapagliflozin Pro-metFORMIN ER Take 1 tablet by mouth daily.        Diagnostic Studies: No results  found.  Disposition: Discharge disposition: 01-Home or Self Care       Discharge Instructions     Discharge patient   Complete by: As directed    Discharge disposition: 01-Home or Self Care   Discharge patient date: 11/05/2022        Follow-up Information     Cleon Gustin, MD. Call in 1 week(s).   Specialty: Urology Contact information: [address]  [place] [address] [phone]                  Signed: Nicolette Bang 11/08/2022, 8:20 AM

## 2022-11-08 NOTE — Patient Instructions (Signed)
Robot-Assisted Ureterolysis and Pyeloplasty, Care After After robot-assisted ureterolysis or pyeloplasty, it is common to have: Mild pain under your rib cage, in your back, or in your shoulder. Nausea. This may last for up to 2 days. Less of an appetite for up to a week. Soreness and mild pain in your abdomen. A small amount of blood or clear fluid coming from your incisions. Constipation and some discomfort caused by gas. This may last for a few days. A small amount of blood in your pee (urine). This may last for a few days. Mild soreness or discomfort when the soft tube (catheter) to drain your pee is removed. Follow these instructions at home: Medicines Take over-the-counter and prescription medicines only as told by your health care provider. If you were prescribed antibiotics, take them as told by your provider. Do not stop using the antibiotic even if you start to feel better. Ask your provider if the medicine prescribed to you requires you to avoid driving or using machinery. Eating and drinking Avoid any foods or drinks that cause gas or discomfort. Drink enough fluid to keep your pee pale yellow. Do not drink alcohol if your provider tells you not to drink. Managing constipation Your procedure may cause constipation. To prevent or treat constipation, you may need to: Take over-the-counter or prescription medicines. Eat foods that are high in fiber, such as beans, whole grains, and fresh fruits and vegetables. Limit foods that are high in fat and processed sugars, such as fried or sweet foods. Incision and drainage tube care  Follow instructions from your provider about how to take care of your incisions. Make sure you: Wash your hands with soap and water for at least 20 seconds before and after you change your bandages (dressings). If soap and water are not available, use hand sanitizer. Change your dressings as told by your provider. Leave stitches (sutures), skin glue, or tape  strips in place. These skin closures may need to stay in place for 2 weeks or longer. If tape strip edges start to loosen and curl up, you may trim the loose edges. Do not remove tape strips completely unless your provider tells you to do that. Keep your incision areas clean and dry. Check your incision areas every day for signs of infection. Check for: Redness, swelling, or more pain. More fluid or blood. Warmth. Pus or a bad smell. If you have a drainage tube, follow instructions from your provider about how to care for it. Do not take the tube out yourself. Change the dressing around the tube as told by your provider. Write down how much fluid drains each day. Note any changes in how the fluid looks or smells. Activity Rest as told by your provider. Do not sit for a long time without moving. Get up to take short walks every 1-2 hours. This will improve blood flow and breathing. Ask for help if you feel weak or unsteady. You may have to avoid lifting. Ask your provider how much you can safely lift. Return to your normal activities as told by your provider. Ask your provider what activities are safe for you. General instructions Do not use any products that contain nicotine or tobacco. These products include cigarettes, chewing tobacco, and vaping devices, such as e-cigarettes. These can delay healing after surgery. If you need help quitting, ask your provider. Do not take baths, swim, or use a hot tub until your provider approves. Ask your provider if you may take showers. You may only  be allowed to take sponge baths. If you have a catheter to drain your pee, follow instructions from your provider about how to care for it and for your drainage bag. Wear compression stockings as told by your provider. These stockings help to prevent blood clots and reduce swelling in your legs. Keep all follow-up visits. Your provider will take out any drains or stents and check for problems. Your provider may  give you more instructions. Make sure you know what you can and cannot do. Contact a health care provider if: You have nausea for more than 2 days after your procedure, or you vomit. You get a cough. You have pain that gets worse or does not get better with medicine. You have pain when you pee (urinate) or trouble peeing. You have more blood in your pee. You have not pooped in more than 2 days. You have any signs of infection at your incision areas. Get help right away if: You have chest pain. You have swelling, redness, or pain in your legs. You have severe pain. Your catheter has been removed, and you are not able to pee. You have a catheter in place, and it is not draining your pee. These symptoms may be an emergency. Get help right away. Call 911. Do not wait to see if the symptoms will go away. Do not drive yourself to the hospital. This information is not intended to replace advice given to you by your health care provider. Make sure you discuss any questions you have with your health care provider. Document Revised: 06/08/2022 Document Reviewed: 06/08/2022 Elsevier Patient Education  [place].

## 2022-11-09 LAB — URINALYSIS, ROUTINE W REFLEX MICROSCOPIC
Bilirubin, UA: NEGATIVE
Ketones, UA: NEGATIVE
Nitrite, UA: NEGATIVE
Specific Gravity, UA: 1.015 (ref 1.005–1.030)
Urobilinogen, Ur: 2 mg/dL — ABNORMAL HIGH (ref 0.2–1.0)
pH, UA: 7 (ref 5.0–7.5)

## 2022-11-09 LAB — MICROSCOPIC EXAMINATION: Bacteria, UA: NONE SEEN

## 2022-11-22 ENCOUNTER — Encounter: Payer: Self-pay | Admitting: Urology

## 2022-11-22 ENCOUNTER — Ambulatory Visit (INDEPENDENT_AMBULATORY_CARE_PROVIDER_SITE_OTHER): Payer: PPO | Admitting: Urology

## 2022-11-22 VITALS — BP 154/78 | HR 67

## 2022-11-22 DIAGNOSIS — Q6239 Other obstructive defects of renal pelvis and ureter: Secondary | ICD-10-CM

## 2022-11-22 LAB — URINALYSIS, ROUTINE W REFLEX MICROSCOPIC
Bilirubin, UA: NEGATIVE
Ketones, UA: NEGATIVE
Nitrite, UA: NEGATIVE
Protein,UA: NEGATIVE
Specific Gravity, UA: 1.015 (ref 1.005–1.030)
Urobilinogen, Ur: 0.2 mg/dL (ref 0.2–1.0)
pH, UA: 6 (ref 5.0–7.5)

## 2022-11-22 LAB — MICROSCOPIC EXAMINATION

## 2022-11-22 NOTE — Progress Notes (Signed)
11/22/2022 10:32 AM   Sophia Young 09-23-1955 [id number]  Referring provider: Celene Squibb, MD [address] [name],  [place] [address]  No chief complaint on file.   HPI: Ms Schnapp is a 67yo here for followup after pyeloplasty. Bruising has resolved on her left flank. No incisional pain. No significant LUTS.    PMH: Past Medical History:  Diagnosis Date   Chronic back pain    Complication of anesthesia    Diabetes mellitus without complication ([place])    History of kidney stones    Hydronephrosis of left kidney    chronic   Hypercholesteremia    Hypertension    PONV (postoperative nausea and vomiting)    Ureteral stricture, left     Surgical History: Past Surgical History:  Procedure Laterality Date   ABDOMINAL HYSTERECTOMY     BACK SURGERY     BIOPSY  06/01/2017   Procedure: BIOPSY;  Surgeon: Daneil Dolin, MD;  [facility];  Service: Endoscopy;;  colon   CHOLECYSTECTOMY     COLONOSCOPY N/A 06/01/2017   Procedure: COLONOSCOPY;  Surgeon: Daneil Dolin, MD;  [facility];  Service: Endoscopy;  Laterality: N/A;  7:30 AM   COLONOSCOPY WITH PROPOFOL N/A 02/27/2021   Procedure: COLONOSCOPY WITH PROPOFOL;  Surgeon: Eloise Harman, DO;  [facility];  Service: Endoscopy;  Laterality: N/A;  10:30am   CYSTOSCOPY W/ RETROGRADES Bilateral 11/06/2020   Procedure: CYSTOSCOPY WITH RETROGRADE PYELOGRAM;  Surgeon: Cleon Gustin, MD;  [facility];  Service: Urology;  Laterality: Bilateral;   CYSTOSCOPY W/ URETERAL STENT PLACEMENT Left 11/06/2020   Procedure: CYSTOSCOPY WITH STENT REPLACEMENT;  Surgeon: Cleon Gustin, MD;  [facility];  Service: Urology;  Laterality: Left;   FLEXIBLE SIGMOIDOSCOPY N/A 06/03/2021   Procedure: FLEXIBLE SIGMOIDOSCOPY;  Surgeon: Ileana Roup, MD;  [facility];  Service: General;  Laterality: N/A;   POLYPECTOMY  06/01/2017   Procedure: POLYPECTOMY;  Surgeon: Daneil Dolin, MD;   [facility];  Service: Endoscopy;;  colon    ROBOT ASSISTED PYELOPLASTY Left 11/01/2022   Procedure: XI ROBOTIC ASSISTED PYELOPLASTY WITH URETERAL STENT PLACEMENT;  Surgeon: Cleon Gustin, MD;  [facility];  Service: Urology;  Laterality: Left;   SP DIL URETER Left 2011   URETERAL STENT PLACEMENT Left 2011   URETEROSCOPY Left 11/06/2020   Procedure: URETEROSCOPY- diagnostic;  Surgeon: Cleon Gustin, MD;  [facility];  Service: Urology;  Laterality: Left;    Home Medications:  Allergies as of 11/22/2022   No Known Allergies      Medication List        Accurate as of November 22, 2022 10:32 AM. If you have any questions, ask your nurse or doctor.          aspirin 81 MG chewable tablet Chew 81 mg by mouth daily.   Centrum Silver 50+Women Tabs Take 1 tablet by mouth 4 (four) times a week.   cholecalciferol 25 MCG (1000 UNIT) tablet Commonly known as: VITAMIN D3 Take 1,000 Units by mouth 4 (four) times a week.   levothyroxine 75 MCG tablet Commonly known as: SYNTHROID Take 75 mcg by mouth daily before breakfast.   losartan 25 MG tablet Commonly known as: COZAAR Take 50 mg by mouth daily.   ondansetron 4 MG tablet Commonly known as: Zofran Take 1 tablet (4 mg total) by mouth daily as needed for nausea or vomiting.  oxyCODONE-acetaminophen 5-325 MG tablet Commonly known as: Percocet Take 1 tablet by mouth every 4 (four) hours as needed for severe pain.   Ozempic (1 MG/DOSE) 4 MG/3ML Sopn Generic drug: Semaglutide (1 MG/DOSE) Inject 1 mg into the skin every 7 (seven) days.   pravastatin 40 MG tablet Commonly known as: PRAVACHOL Take 40 mg by mouth daily.   sulfamethoxazole-trimethoprim 800-160 MG tablet Commonly known as: BACTRIM DS Take 1 tablet by mouth every 12 (twelve) hours.   sulfamethoxazole-trimethoprim 800-160 MG tablet Commonly known as: BACTRIM DS Take 1 tablet by mouth daily. To start daily after completing the bactrim  twice daily for 1 week.  Please continue daily until 1 week after surgery.   triamterene-hydrochlorothiazide 37.5-25 MG capsule Commonly known as: DYAZIDE Take 1 capsule by mouth daily.   Xigduo XR 06-999 MG Tb24 Generic drug: Dapagliflozin Pro-metFORMIN ER Take 1 tablet by mouth daily.        Allergies: No Known Allergies  Family History: Family History  Problem Relation Age of Onset   Prostate cancer Brother    Crohn's disease Cousin    Colon cancer Neg Hx     Social History:  reports that she has quit smoking. Her smoking use included cigarettes. She has a 1.50 pack-year smoking history. She has never used smokeless tobacco. She reports that she does not drink alcohol and does not use drugs.  ROS: All other review of systems were reviewed and are negative except what is noted above in HPI  Physical Exam: BP (!) 154/78   Pulse 67   Constitutional:  Alert and oriented, No acute distress. HEENT: [place] AT, moist mucus membranes.  Trachea midline, no masses. Cardiovascular: No clubbing, cyanosis, or edema. Respiratory: Normal respiratory effort, no increased work of breathing. GI: Abdomen is soft, nontender, nondistended, no abdominal masses GU: No CVA tenderness.  Lymph: No cervical or inguinal lymphadenopathy. Skin: No rashes, bruises or suspicious lesions. Neurologic: Grossly intact, no focal deficits, moving all 4 extremities. Psychiatric: Normal mood and affect.  Laboratory Data: Lab Results  Component Value Date   WBC 13.2 (H) 11/02/2022   HGB 11.8 (L) 11/02/2022   HCT 35.5 (L) 11/02/2022   MCV 94.2 11/02/2022   PLT 240 11/02/2022    Lab Results  Component Value Date   CREATININE 0.83 11/02/2022    No results found for: "PSA"  No results found for: "TESTOSTERONE"  Lab Results  Component Value Date   HGBA1C 4.8 10/28/2022    Urinalysis    Component Value Date/Time   COLORURINE BROWN (A) 10/26/2020 1622   APPEARANCEUR Cloudy (A) 11/08/2022 1031    LABSPEC 1.023 10/26/2020 1622   PHURINE 6.0 10/26/2020 1622   GLUCOSEU 3+ (A) 11/08/2022 1031   HGBUR LARGE (A) 10/26/2020 1622   BILIRUBINUR Negative 11/08/2022 1031   KETONESUR NEGATIVE 10/26/2020 1622   PROTEINUR 2+ (A) 11/08/2022 1031   PROTEINUR >=300 (A) 10/26/2020 1622   UROBILINOGEN 4.0 (H) 01/22/2010 0859   NITRITE Negative 11/08/2022 1031   NITRITE NEGATIVE 10/26/2020 1622   LEUKOCYTESUR 1+ (A) 11/08/2022 1031   LEUKOCYTESUR MODERATE (A) 10/26/2020 1622    Lab Results  Component Value Date   LABMICR See below: 11/08/2022   WBCUA 11-30 (A) 11/08/2022   LABEPIT 0-10 11/08/2022   MUCUS Present 12/19/2020   BACTERIA None seen 11/08/2022    Pertinent Imaging:  Results for orders placed during the hospital encounter of 02/10/10  DG Abd 1 View  Narrative Clinical Data: Left renal calculus  ABDOMEN -  1 VIEW  Comparison: None Correlation:  CT abdomen pelvis 01/22/2010  Findings: Surgical clip left pelvis. Elongated calcification left pelvis, corresponding to a vascular calcification seen on preceding CT. Facet degenerative changes lower lumbar spine. No definite urinary tract calcification identified. Bowel gas pattern normal. No acute bony findings.  IMPRESSION: No definite urinary tract calcification identified.  Provider: Jennye Boroughs  No results found for this or any previous visit.  No results found for this or any previous visit.  No results found for this or any previous visit.  Results for orders placed during the hospital encounter of 07/07/22  US RENAL  Narrative CLINICAL DATA:  Congenital LEFT UPJ obstruction  EXAM: RENAL / URINARY TRACT ULTRASOUND COMPLETE  COMPARISON:  05/29/2021  FINDINGS: Right Kidney:  Renal measurements: 11.3 x 5.1 x 5.6 cm = volume: 168 mL. Normal cortical thickness and echogenicity. No mass, hydronephrosis, or shadowing calcification  Left Kidney:  Renal measurements: 14.2 x 6.6 x 5.8 cm = volume:  284 ML. Cortical thinning. Upper normal cortical echogenicity. Severe hydronephrosis with associated cortical thinning. No mass or shadowing calcification. Persistence of LEFT hydronephrosis after voiding.  Bladder:  RIGHT ureteral jet visualized. Bladder otherwise unremarkable. No wall thickening or mass seen.  Other:  None.  IMPRESSION: Severe LEFT hydronephrosis and associated LEFT renal cortical thinning, slightly increased from previous study.  Remainder of exam unremarkable.   Electronically Signed By: Lavonia Dana M.D. On: 07/08/2022 18:36  No valid procedures specified. No results found for this or any previous visit.  No results found for this or any previous visit.   Assessment & Plan:    1. UPJ obstruction, congenital Followup 4 weeks for stent removal - Urinalysis, Routine w reflex microscopic   No follow-ups on file.  Nicolette Bang, MD  [facility] [place]

## 2022-11-24 DIAGNOSIS — M25562 Pain in left knee: Secondary | ICD-10-CM | POA: Diagnosis not present

## 2022-12-20 ENCOUNTER — Ambulatory Visit (INDEPENDENT_AMBULATORY_CARE_PROVIDER_SITE_OTHER): Payer: PPO | Admitting: Urology

## 2022-12-20 VITALS — BP 153/84 | HR 67 | Ht 63.0 in | Wt 175.0 lb

## 2022-12-20 DIAGNOSIS — Q6239 Other obstructive defects of renal pelvis and ureter: Secondary | ICD-10-CM | POA: Diagnosis not present

## 2022-12-20 LAB — MICROSCOPIC EXAMINATION: Bacteria, UA: NONE SEEN

## 2022-12-20 LAB — URINALYSIS, ROUTINE W REFLEX MICROSCOPIC
Bilirubin, UA: NEGATIVE
Glucose, UA: NEGATIVE
Ketones, UA: NEGATIVE
Nitrite, UA: NEGATIVE
Protein,UA: NEGATIVE
Specific Gravity, UA: 1.01 (ref 1.005–1.030)
Urobilinogen, Ur: 0.2 mg/dL (ref 0.2–1.0)
pH, UA: 7 (ref 5.0–7.5)

## 2022-12-20 MED ORDER — CIPROFLOXACIN HCL 500 MG PO TABS: 500.0000 mg | ORAL_TABLET | Freq: Once | ORAL | Status: DC

## 2022-12-20 NOTE — Progress Notes (Unsigned)
   12/20/22  CC: followup after left pyeloplasty   HPI: Sophia Young is here for stent removal Blood pressure (!) 153/84, pulse 67, height 5\' 3"  (1.6 m), weight 175 lb (79.4 kg). NED. A&Ox3.   No respiratory distress   Abd soft, NT, ND Normal external genitalia with patent urethral meatus  Cystoscopy Procedure Note  Patient identification was confirmed, informed consent was obtained, and patient was prepped using Betadine solution.  Lidocaine jelly was administered per urethral meatus.    Procedure: - Flexible cystoscope introduced, without any difficulty.   - Thorough search of the bladder revealed:    normal urethral meatus    normal urothelium    no stones    no ulcers     no tumors    no urethral polyps    no trabeculation  - Ureteral orifices were normal in position and appearance. Using a grasper the left ureteral stent was removed intact  Post-Procedure: - Patient tolerated the procedure well  Assessment/ Plan: Followup 6 weeks with renl [place]   No follow-ups on file.  Wilkie Aye, MD

## 2022-12-21 ENCOUNTER — Encounter: Payer: Self-pay | Admitting: Urology

## 2022-12-21 NOTE — Patient Instructions (Signed)
Hydronephrosis  Hydronephrosis is the swelling of one or both kidneys due to a blockage that stops urine from flowing out of the body. Kidneys filter waste from the blood and produce urine. This condition can lead to kidney failure and may become life-threatening if not treated promptly. What are the causes? In infants and children, common causes include problems that occur when a baby is developing in the womb. These can include problems in the kidneys or in the tubes that drain urine into the bladder (ureters). In adults, common causes include: Kidney stones. Pregnancy. A tumor or cyst in the abdomen or pelvis. An enlarged prostate gland. Other causes include: Bladder infection. Scar tissue from a previous surgery or injury. A blood clot. Cancer of the prostate, bladder, uterus, ovary, or colon. What are the signs or symptoms? Symptoms of this condition include: Pain or discomfort in your side (flank) or abdomen. Swelling in your abdomen. Nausea and vomiting. Fever. Pain when passing urine. Feelings of urgency when you need to urinate. Urinating more often than normal. In some cases, you may not have any symptoms. How is this diagnosed? This condition may be diagnosed based on: Your symptoms and medical history. A physical exam. Blood and urine tests. Imaging tests, such as an ultrasound, CT scan, or MRI. A procedure to look at your urinary tract and bladder by inserting a scope into the urethra (cystoscopy). How is this treated? Treatment for this condition depends on where the blockage is, how long it has been there, and what caused it. The goal of treatment is to remove the blockage. Treatment may include: Antibiotic medicines to treat or prevent infection. A procedure to place a small, thin tube (stent) into a blocked ureter. The stent will keep the ureter open so that urine can drain through it. A nonsurgical procedure that crushes kidney stones with shock waves  (extracorporeal shock wave lithotripsy). If kidney failure occurs, treatment may include dialysis or a kidney transplant. Follow these instructions at home:  Take over-the-counter and prescription medicines only as told by your health care provider. If you were prescribed an antibiotic medicine, take it exactly as told by your health care provider. Do not stop taking the antibiotic even if you start to feel better. Rest and return to your normal activities as told by your health care provider. Ask your health care provider what activities are safe for you. Drink enough fluid to keep your urine pale yellow. Keep all follow-up visits. This is important. Contact a health care provider if: You continue to have symptoms after treatment. You develop new symptoms. Your urine becomes cloudy or bloody. You have a fever. Get help right away if: You have severe flank or abdominal pain. You cannot drink fluids without vomiting. Summary Hydronephrosis is the swelling of one or both kidneys due to a blockage that stops urine from flowing out of the body. Hydronephrosis can lead to kidney failure and may become life-threatening if not treated promptly. The goal of treatment is to remove the blockage. It may include a procedure to insert a stent into a blocked ureter, a procedure to break up kidney stones, or taking antibiotic medicines. Follow your health care provider's instructions for taking care of yourself at home, including instructions about drinking fluids, taking medicines, and limiting activities. This information is not intended to replace advice given to you by your health care provider. Make sure you discuss any questions you have with your health care provider. Document Revised: 12/18/2019 Document Reviewed: 12/18/2019 Elsevier   Patient Education  2023 Elsevier Inc.  

## 2022-12-29 DIAGNOSIS — E039 Hypothyroidism, unspecified: Secondary | ICD-10-CM | POA: Diagnosis not present

## 2022-12-29 DIAGNOSIS — E559 Vitamin D deficiency, unspecified: Secondary | ICD-10-CM | POA: Diagnosis not present

## 2022-12-29 DIAGNOSIS — E1169 Type 2 diabetes mellitus with other specified complication: Secondary | ICD-10-CM | POA: Diagnosis not present

## 2022-12-29 DIAGNOSIS — E782 Mixed hyperlipidemia: Secondary | ICD-10-CM | POA: Diagnosis not present

## 2023-01-04 DIAGNOSIS — E782 Mixed hyperlipidemia: Secondary | ICD-10-CM | POA: Diagnosis not present

## 2023-01-04 DIAGNOSIS — I10 Essential (primary) hypertension: Secondary | ICD-10-CM | POA: Diagnosis not present

## 2023-01-04 DIAGNOSIS — E1122 Type 2 diabetes mellitus with diabetic chronic kidney disease: Secondary | ICD-10-CM | POA: Diagnosis not present

## 2023-01-04 DIAGNOSIS — E559 Vitamin D deficiency, unspecified: Secondary | ICD-10-CM | POA: Diagnosis not present

## 2023-01-04 DIAGNOSIS — F411 Generalized anxiety disorder: Secondary | ICD-10-CM | POA: Diagnosis not present

## 2023-01-04 DIAGNOSIS — E1165 Type 2 diabetes mellitus with hyperglycemia: Secondary | ICD-10-CM | POA: Diagnosis not present

## 2023-01-04 DIAGNOSIS — Z713 Dietary counseling and surveillance: Secondary | ICD-10-CM | POA: Diagnosis not present

## 2023-01-04 DIAGNOSIS — N1330 Unspecified hydronephrosis: Secondary | ICD-10-CM | POA: Diagnosis not present

## 2023-01-04 DIAGNOSIS — I129 Hypertensive chronic kidney disease with stage 1 through stage 4 chronic kidney disease, or unspecified chronic kidney disease: Secondary | ICD-10-CM | POA: Diagnosis not present

## 2023-01-04 DIAGNOSIS — E039 Hypothyroidism, unspecified: Secondary | ICD-10-CM | POA: Diagnosis not present

## 2023-01-04 DIAGNOSIS — N1831 Chronic kidney disease, stage 3a: Secondary | ICD-10-CM | POA: Diagnosis not present

## 2023-01-17 ENCOUNTER — Ambulatory Visit (HOSPITAL_COMMUNITY)
Admission: RE | Admit: 2023-01-17 | Discharge: 2023-01-17 | Disposition: A | Discharge status: Home / Self Care | Payer: PPO | Source: Ambulatory Visit | Attending: Urology | Admitting: Urology

## 2023-01-17 DIAGNOSIS — N1330 Unspecified hydronephrosis: Secondary | ICD-10-CM | POA: Diagnosis not present

## 2023-01-17 DIAGNOSIS — Q6239 Other obstructive defects of renal pelvis and ureter: Secondary | ICD-10-CM | POA: Diagnosis not present

## 2023-01-19 DIAGNOSIS — F330 Major depressive disorder, recurrent, mild: Secondary | ICD-10-CM | POA: Diagnosis not present

## 2023-01-19 DIAGNOSIS — K5900 Constipation, unspecified: Secondary | ICD-10-CM | POA: Diagnosis not present

## 2023-01-19 DIAGNOSIS — Z7982 Long term (current) use of aspirin: Secondary | ICD-10-CM | POA: Diagnosis not present

## 2023-01-19 DIAGNOSIS — E669 Obesity, unspecified: Secondary | ICD-10-CM | POA: Diagnosis not present

## 2023-01-19 DIAGNOSIS — Z87891 Personal history of nicotine dependence: Secondary | ICD-10-CM | POA: Diagnosis not present

## 2023-01-19 DIAGNOSIS — E039 Hypothyroidism, unspecified: Secondary | ICD-10-CM | POA: Diagnosis not present

## 2023-01-19 DIAGNOSIS — N1831 Chronic kidney disease, stage 3a: Secondary | ICD-10-CM | POA: Diagnosis not present

## 2023-01-19 DIAGNOSIS — E785 Hyperlipidemia, unspecified: Secondary | ICD-10-CM | POA: Diagnosis not present

## 2023-01-19 DIAGNOSIS — I129 Hypertensive chronic kidney disease with stage 1 through stage 4 chronic kidney disease, or unspecified chronic kidney disease: Secondary | ICD-10-CM | POA: Diagnosis not present

## 2023-01-19 DIAGNOSIS — E1169 Type 2 diabetes mellitus with other specified complication: Secondary | ICD-10-CM | POA: Diagnosis not present

## 2023-01-19 DIAGNOSIS — I10 Essential (primary) hypertension: Secondary | ICD-10-CM | POA: Diagnosis not present

## 2023-01-19 DIAGNOSIS — E1122 Type 2 diabetes mellitus with diabetic chronic kidney disease: Secondary | ICD-10-CM | POA: Diagnosis not present

## 2023-02-02 ENCOUNTER — Ambulatory Visit (INDEPENDENT_AMBULATORY_CARE_PROVIDER_SITE_OTHER): Payer: PPO | Admitting: Urology

## 2023-02-02 VITALS — BP 159/79 | HR 79

## 2023-02-02 DIAGNOSIS — Q6211 Congenital occlusion of ureteropelvic junction: Secondary | ICD-10-CM

## 2023-02-02 DIAGNOSIS — N1330 Unspecified hydronephrosis: Secondary | ICD-10-CM

## 2023-02-02 DIAGNOSIS — Q6239 Other obstructive defects of renal pelvis and ureter: Secondary | ICD-10-CM

## 2023-02-02 LAB — MICROSCOPIC EXAMINATION
Bacteria, UA: NONE SEEN
RBC, Urine: NONE SEEN /hpf (ref 0–2)

## 2023-02-02 LAB — URINALYSIS, ROUTINE W REFLEX MICROSCOPIC
Bilirubin, UA: NEGATIVE
Glucose, UA: NEGATIVE
Ketones, UA: NEGATIVE
Nitrite, UA: NEGATIVE
Protein,UA: NEGATIVE
RBC, UA: NEGATIVE
Specific Gravity, UA: 1.015 (ref 1.005–1.030)
Urobilinogen, Ur: 0.2 mg/dL (ref 0.2–1.0)
pH, UA: 6.5 (ref 5.0–7.5)

## 2023-02-02 NOTE — Progress Notes (Unsigned)
02/02/2023 11:29 AM   Sophia Young 1956-03-02 [id number]  Referring provider: Benita Stabile, MD [address] [name],  [place] [address]  No chief complaint on file.   HPI: Renal US shows stable left hydronephrosis   PMH: Past Medical History:  Diagnosis Date   Chronic back pain    Complication of anesthesia    Diabetes mellitus without complication ([facility])    History of kidney stones    Hydronephrosis of left kidney    chronic   Hypercholesteremia    Hypertension    PONV (postoperative nausea and vomiting)    Ureteral stricture, left     Surgical History: Past Surgical History:  Procedure Laterality Date   ABDOMINAL HYSTERECTOMY     BACK SURGERY     BIOPSY  06/01/2017   Procedure: BIOPSY;  Surgeon: Corbin Ade, MD;  [facility];  Service: Endoscopy;;  colon   CHOLECYSTECTOMY     COLONOSCOPY N/A 06/01/2017   Procedure: COLONOSCOPY;  Surgeon: Corbin Ade, MD;  [facility];  Service: Endoscopy;  Laterality: N/A;  7:30 AM   COLONOSCOPY WITH PROPOFOL N/A 02/27/2021   Procedure: COLONOSCOPY WITH PROPOFOL;  Surgeon: Lanelle Bal, DO;  [facility];  Service: Endoscopy;  Laterality: N/A;  10:30am   CYSTOSCOPY W/ RETROGRADES Bilateral 11/06/2020   Procedure: CYSTOSCOPY WITH RETROGRADE PYELOGRAM;  Surgeon: Malen Gauze, MD;  [facility];  Service: Urology;  Laterality: Bilateral;   CYSTOSCOPY W/ URETERAL STENT PLACEMENT Left 11/06/2020   Procedure: CYSTOSCOPY WITH STENT REPLACEMENT;  Surgeon: Malen Gauze, MD;  [facility];  Service: Urology;  Laterality: Left;   FLEXIBLE SIGMOIDOSCOPY N/A 06/03/2021   Procedure: FLEXIBLE SIGMOIDOSCOPY;  Surgeon: Andria Meuse, MD;  [facility];  Service: General;  Laterality: N/A;   POLYPECTOMY  06/01/2017   Procedure: POLYPECTOMY;  Surgeon: Corbin Ade, MD;  [facility];  Service: Endoscopy;;  colon    ROBOT ASSISTED PYELOPLASTY Left 11/01/2022    Procedure: XI ROBOTIC ASSISTED PYELOPLASTY WITH URETERAL STENT PLACEMENT;  Surgeon: Malen Gauze, MD;  [facility];  Service: Urology;  Laterality: Left;   SP DIL URETER Left 2011   URETERAL STENT PLACEMENT Left 2011   URETEROSCOPY Left 11/06/2020   Procedure: URETEROSCOPY- diagnostic;  Surgeon: Malen Gauze, MD;  [facility];  Service: Urology;  Laterality: Left;    Home Medications:  Allergies as of 02/02/2023   No Known Allergies      Medication List        Accurate as of Feb 02, 2023 11:29 AM. If you have any questions, ask your nurse or doctor.          aspirin 81 MG chewable tablet Chew 81 mg by mouth daily.   Centrum Silver 50+Women Tabs Take 1 tablet by mouth 4 (four) times a week.   cholecalciferol 25 MCG (1000 UNIT) tablet Commonly known as: VITAMIN D3 Take 1,000 Units by mouth 4 (four) times a week.   levothyroxine 75 MCG tablet Commonly known as: SYNTHROID Take 75 mcg by mouth daily before breakfast.   losartan 25 MG tablet Commonly known as: COZAAR Take 50 mg by mouth daily.   metFORMIN 500 MG (MOD) 24 hr tablet Commonly known as: GLUMETZA Take 500 mg by mouth.   ondansetron 4 MG tablet Commonly known as: Zofran Take 1 tablet (4 mg total) by mouth daily as needed for nausea or vomiting.  oxyCODONE-acetaminophen 5-325 MG tablet Commonly known as: Percocet Take 1 tablet by mouth every 4 (four) hours as needed for severe pain.   Ozempic (1 MG/DOSE) 4 MG/3ML Sopn Generic drug: Semaglutide (1 MG/DOSE) Inject 1 mg into the skin every 7 (seven) days.   pravastatin 40 MG tablet Commonly known as: PRAVACHOL Take 40 mg by mouth daily.   sulfamethoxazole-trimethoprim 800-160 MG tablet Commonly known as: BACTRIM DS Take 1 tablet by mouth every 12 (twelve) hours.   sulfamethoxazole-trimethoprim 800-160 MG tablet Commonly known as: BACTRIM DS Take 1 tablet by mouth daily. To start daily after completing the bactrim twice  daily for 1 week.  Please continue daily until 1 week after surgery.   triamterene-hydrochlorothiazide 37.5-25 MG capsule Commonly known as: DYAZIDE Take 1 capsule by mouth daily.   Xigduo XR 06-999 MG Tb24 Generic drug: Dapagliflozin Pro-metFORMIN ER Take 1 tablet by mouth daily.        Allergies: No Known Allergies  Family History: Family History  Problem Relation Age of Onset   Prostate cancer Brother    Crohn's disease Cousin    Colon cancer Neg Hx     Social History:  reports that she has quit smoking. Her smoking use included cigarettes. She has a 1.50 pack-year smoking history. She has never used smokeless tobacco. She reports that she does not drink alcohol and does not use drugs.  ROS: All other review of systems were reviewed and are negative except what is noted above in HPI  Physical Exam: BP (!) 159/79   Pulse 79   Constitutional:  Alert and oriented, No acute distress. HEENT: [place] AT, moist mucus membranes.  Trachea midline, no masses. Cardiovascular: No clubbing, cyanosis, or edema. Respiratory: Normal respiratory effort, no increased work of breathing. GI: Abdomen is soft, nontender, nondistended, no abdominal masses GU: No CVA tenderness.  Lymph: No cervical or inguinal lymphadenopathy. Skin: No rashes, bruises or suspicious lesions. Neurologic: Grossly intact, no focal deficits, moving all 4 extremities. Psychiatric: Normal mood and affect.  Laboratory Data: Lab Results  Component Value Date   WBC 13.2 (H) 11/02/2022   HGB 11.8 (L) 11/02/2022   HCT 35.5 (L) 11/02/2022   MCV 94.2 11/02/2022   PLT 240 11/02/2022    Lab Results  Component Value Date   CREATININE 0.83 11/02/2022    No results found for: "PSA"  No results found for: "TESTOSTERONE"  Lab Results  Component Value Date   HGBA1C 4.8 10/28/2022    Urinalysis    Component Value Date/Time   COLORURINE BROWN (A) 10/26/2020 1622   APPEARANCEUR Clear 12/20/2022 1326   LABSPEC  1.023 10/26/2020 1622   PHURINE 6.0 10/26/2020 1622   GLUCOSEU Negative 12/20/2022 1326   HGBUR LARGE (A) 10/26/2020 1622   BILIRUBINUR Negative 12/20/2022 1326   KETONESUR NEGATIVE 10/26/2020 1622   PROTEINUR Negative 12/20/2022 1326   PROTEINUR >=300 (A) 10/26/2020 1622   UROBILINOGEN 4.0 (H) 01/22/2010 0859   NITRITE Negative 12/20/2022 1326   NITRITE NEGATIVE 10/26/2020 1622   LEUKOCYTESUR 1+ (A) 12/20/2022 1326   LEUKOCYTESUR MODERATE (A) 10/26/2020 1622    Lab Results  Component Value Date   LABMICR See below: 12/20/2022   WBCUA 6-10 (A) 12/20/2022   LABEPIT 0-10 12/20/2022   MUCUS Present 12/19/2020   BACTERIA None seen 12/20/2022    Pertinent Imaging: *** Results for orders placed during the hospital encounter of 02/10/10  DG Abd 1 View  Narrative Clinical Data: Left renal calculus  ABDOMEN - 1 VIEW  Comparison: None Correlation:  CT abdomen pelvis 01/22/2010  Findings: Surgical clip left pelvis. Elongated calcification left pelvis, corresponding to a vascular calcification seen on preceding CT. Facet degenerative changes lower lumbar spine. No definite urinary tract calcification identified. Bowel gas pattern normal. No acute bony findings.  IMPRESSION: No definite urinary tract calcification identified.  Provider: Wilkie Aye  No results found for this or any previous visit.  No results found for this or any previous visit.  No results found for this or any previous visit.  Results for orders placed during the hospital encounter of 01/17/23  Ultrasound renal complete  Narrative CLINICAL DATA:  Left hydronephrosis.  UPJ obstruction.  EXAM: RENAL / URINARY TRACT ULTRASOUND COMPLETE  COMPARISON:  Ultrasound July 07, 2022  FINDINGS: Right Kidney:  Renal measurements: 11.1 x 5 x 4.8 cm = volume: 139 mL. Echogenicity within normal limits. No mass or hydronephrosis visualized.  Left Kidney:  Renal measurements: 16.5 x 8.4 x 7.8 cm  = volume: 571 mL. Moderate to severe hydronephrosis with cortical thinning is essentially unchanged.  Bladder:  The bladder was empty and could not be evaluated.  Other:  None.  IMPRESSION: 1. Moderate to severe left hydronephrosis with cortical thinning is essentially unchanged. 2. The right kidney is normal. 3. The bladder was empty and could not be evaluated.   Electronically Signed By: Gerome Sam III M.D. On: 01/17/2023 15:18  No valid procedures specified. No results found for this or any previous visit.  No results found for this or any previous visit.   Assessment & Plan:    1.  UPJ obstruction, congenital Followup 3 months with Lasix Renogram - Urinalysis, Routine w reflex microscopic     No follow-ups on file.  Wilkie Aye, MD  [facility] [place]

## 2023-02-03 ENCOUNTER — Encounter: Payer: Self-pay | Admitting: Urology

## 2023-02-03 NOTE — Patient Instructions (Signed)
Hydronephrosis  Hydronephrosis is the swelling of one or both kidneys due to a blockage that stops urine from flowing out of the body. Kidneys filter waste from the blood and produce urine. This condition can lead to kidney failure and may become life-threatening if not treated promptly. What are the causes? In infants and children, common causes include problems that occur when a baby is developing in the womb. These can include problems in the kidneys or in the tubes that drain urine into the bladder (ureters). In adults, common causes include: Kidney stones. Pregnancy. A tumor or cyst in the abdomen or pelvis. An enlarged prostate gland. Other causes include: Bladder infection. Scar tissue from a previous surgery or injury. A blood clot. Cancer of the prostate, bladder, uterus, ovary, or colon. What are the signs or symptoms? Symptoms of this condition include: Pain or discomfort in your side (flank) or abdomen. Swelling in your abdomen. Nausea and vomiting. Fever. Pain when passing urine. Feelings of urgency when you need to urinate. Urinating more often than normal. In some cases, you may not have any symptoms. How is this diagnosed? This condition may be diagnosed based on: Your symptoms and medical history. A physical exam. Blood and urine tests. Imaging tests, such as an ultrasound, CT scan, or MRI. A procedure to look at your urinary tract and bladder by inserting a scope into the urethra (cystoscopy). How is this treated? Treatment for this condition depends on where the blockage is, how long it has been there, and what caused it. The goal of treatment is to remove the blockage. Treatment may include: Antibiotic medicines to treat or prevent infection. A procedure to place a small, thin tube (stent) into a blocked ureter. The stent will keep the ureter open so that urine can drain through it. A nonsurgical procedure that crushes kidney stones with shock waves  (extracorporeal shock wave lithotripsy). If kidney failure occurs, treatment may include dialysis or a kidney transplant. Follow these instructions at home:  Take over-the-counter and prescription medicines only as told by your health care provider. If you were prescribed an antibiotic medicine, take it exactly as told by your health care provider. Do not stop taking the antibiotic even if you start to feel better. Rest and return to your normal activities as told by your health care provider. Ask your health care provider what activities are safe for you. Drink enough fluid to keep your urine pale yellow. Keep all follow-up visits. This is important. Contact a health care provider if: You continue to have symptoms after treatment. You develop new symptoms. Your urine becomes cloudy or bloody. You have a fever. Get help right away if: You have severe flank or abdominal pain. You cannot drink fluids without vomiting. Summary Hydronephrosis is the swelling of one or both kidneys due to a blockage that stops urine from flowing out of the body. Hydronephrosis can lead to kidney failure and may become life-threatening if not treated promptly. The goal of treatment is to remove the blockage. It may include a procedure to insert a stent into a blocked ureter, a procedure to break up kidney stones, or taking antibiotic medicines. Follow your health care provider's instructions for taking care of yourself at home, including instructions about drinking fluids, taking medicines, and limiting activities. This information is not intended to replace advice given to you by your health care provider. Make sure you discuss any questions you have with your health care provider. Document Revised: 12/18/2019 Document Reviewed: 12/18/2019 Elsevier  Patient Education  2024 [employer].

## 2023-03-30 ENCOUNTER — Ambulatory Visit (HOSPITAL_COMMUNITY)
Admission: RE | Admit: 2023-03-30 | Discharge: 2023-03-30 | Disposition: A | Discharge status: Home / Self Care | Payer: PPO | Source: Ambulatory Visit | Attending: Urology | Admitting: Urology

## 2023-03-30 DIAGNOSIS — Q6211 Congenital occlusion of ureteropelvic junction: Secondary | ICD-10-CM | POA: Insufficient documentation

## 2023-03-30 DIAGNOSIS — Z0389 Encounter for observation for other suspected diseases and conditions ruled out: Secondary | ICD-10-CM | POA: Diagnosis not present

## 2023-03-30 MED ORDER — FUROSEMIDE 10 MG/ML IJ SOLN
INTRAMUSCULAR | Status: AC
  Filled 2023-03-30: qty 4

## 2023-03-30 MED ORDER — TECHNETIUM TC 99M MERTIATIDE
5.0000 | Freq: Once | INTRAVENOUS | Status: AC | PRN
  Administered 2023-03-30: 5.1 via INTRAVENOUS

## 2023-03-30 MED ORDER — FUROSEMIDE 10 MG/ML IJ SOLN
40.0000 mg | Freq: Once | INTRAMUSCULAR | Status: AC
  Administered 2023-03-30: 40 mg via INTRAVENOUS

## 2023-05-09 ENCOUNTER — Encounter: Payer: Self-pay | Admitting: Urology

## 2023-05-09 ENCOUNTER — Ambulatory Visit: Payer: PPO | Admitting: Urology

## 2023-05-09 VITALS — BP 153/81 | HR 60

## 2023-05-09 DIAGNOSIS — N1330 Unspecified hydronephrosis: Secondary | ICD-10-CM | POA: Diagnosis not present

## 2023-05-09 LAB — URINALYSIS, ROUTINE W REFLEX MICROSCOPIC
Bilirubin, UA: NEGATIVE
Glucose, UA: NEGATIVE
Ketones, UA: NEGATIVE
Nitrite, UA: NEGATIVE
Protein,UA: NEGATIVE
RBC, UA: NEGATIVE
Specific Gravity, UA: 1.02 (ref 1.005–1.030)
Urobilinogen, Ur: 0.2 mg/dL (ref 0.2–1.0)
pH, UA: 6 (ref 5.0–7.5)

## 2023-05-09 LAB — MICROSCOPIC EXAMINATION
Epithelial Cells (non renal): >10 /hpf — AB (ref 0–10)
WBC, UA: >30 /hpf — AB (ref 0–5)

## 2023-05-09 NOTE — Progress Notes (Unsigned)
05/09/2023 11:19 AM   Sophia Young 03/09/1956 [id number]  Referring provider: Benita Stabile, MD [address] [name],  [place] [address]  No chief complaint on file.   HPI:    PMH: Past Medical History:  Diagnosis Date   Chronic back pain    Complication of anesthesia    Diabetes mellitus without complication (HCC)    History of kidney stones    Hydronephrosis of left kidney    chronic   Hypercholesteremia    Hypertension    PONV (postoperative nausea and vomiting)    Ureteral stricture, left     Surgical History: Past Surgical History:  Procedure Laterality Date   ABDOMINAL HYSTERECTOMY     BACK SURGERY     BIOPSY  06/01/2017   Procedure: BIOPSY;  Surgeon: Corbin Ade, MD;  [facility];  Service: Endoscopy;;  colon   CHOLECYSTECTOMY     COLONOSCOPY N/A 06/01/2017   Procedure: COLONOSCOPY;  Surgeon: Corbin Ade, MD;  [facility];  Service: Endoscopy;  Laterality: N/A;  7:30 AM   COLONOSCOPY WITH PROPOFOL N/A 02/27/2021   Procedure: COLONOSCOPY WITH PROPOFOL;  Surgeon: Lanelle Bal, DO;  [facility];  Service: Endoscopy;  Laterality: N/A;  10:30am   CYSTOSCOPY W/ RETROGRADES Bilateral 11/06/2020   Procedure: CYSTOSCOPY WITH RETROGRADE PYELOGRAM;  Surgeon: Malen Gauze, MD;  [facility]: [facility];  Service: Urology;  Laterality: Bilateral;   CYSTOSCOPY W/ URETERAL STENT PLACEMENT Left 11/06/2020   Procedure: CYSTOSCOPY WITH STENT REPLACEMENT;  Surgeon: Malen Gauze, MD;  [facility];  Service: Urology;  Laterality: Left;   FLEXIBLE SIGMOIDOSCOPY N/A 06/03/2021   Procedure: FLEXIBLE SIGMOIDOSCOPY;  Surgeon: Andria Meuse, MD;  [facility]: [facility];  Service: General;  Laterality: N/A;   POLYPECTOMY  06/01/2017   Procedure: POLYPECTOMY;  Surgeon: Corbin Ade, MD;  [facility];  Service: Endoscopy;;  colon    ROBOT ASSISTED PYELOPLASTY Left 11/01/2022   Procedure: XI ROBOTIC ASSISTED  PYELOPLASTY WITH URETERAL STENT PLACEMENT;  Surgeon: Malen Gauze, MD;  [facility];  Service: Urology;  Laterality: Left;   SP DIL URETER Left 2011   URETERAL STENT PLACEMENT Left 2011   URETEROSCOPY Left 11/06/2020   Procedure: URETEROSCOPY- diagnostic;  Surgeon: Malen Gauze, MD;  [facility];  Service: Urology;  Laterality: Left;    Home Medications:  Allergies as of 05/09/2023   No Known Allergies      Medication List        Accurate as of May 09, 2023 11:19 AM. If you have any questions, ask your nurse or doctor.          aspirin 81 MG chewable tablet Chew 81 mg by mouth daily.   Centrum Silver 50+Women Tabs Take 1 tablet by mouth 4 (four) times a week.   cholecalciferol 25 MCG (1000 UNIT) tablet Commonly known as: VITAMIN D3 Take 1,000 Units by mouth 4 (four) times a week.   levothyroxine 75 MCG tablet Commonly known as: SYNTHROID Take 75 mcg by mouth daily before breakfast.   losartan 25 MG tablet Commonly known as: COZAAR Take 50 mg by mouth daily.   metFORMIN 500 MG (MOD) 24 hr tablet Commonly known as: GLUMETZA Take 500 mg by mouth.   ondansetron 4 MG tablet Commonly known as: Zofran Take 1 tablet (4 mg total) by mouth daily as needed for nausea or vomiting.   oxyCODONE-acetaminophen 5-325 MG tablet Commonly  known as: Percocet Take 1 tablet by mouth every 4 (four) hours as needed for severe pain.   Ozempic (1 MG/DOSE) 4 MG/3ML Sopn Generic drug: Semaglutide (1 MG/DOSE) Inject 1 mg into the skin every 7 (seven) days.   pravastatin 40 MG tablet Commonly known as: PRAVACHOL Take 40 mg by mouth daily.   sulfamethoxazole-trimethoprim 800-160 MG tablet Commonly known as: BACTRIM DS Take 1 tablet by mouth every 12 (twelve) hours.   sulfamethoxazole-trimethoprim 800-160 MG tablet Commonly known as: BACTRIM DS Take 1 tablet by mouth daily. To start daily after completing the bactrim twice daily for 1 week.  Please continue  daily until 1 week after surgery.   triamterene-hydrochlorothiazide 37.5-25 MG capsule Commonly known as: DYAZIDE Take 1 capsule by mouth daily.   Xigduo XR 06-999 MG Tb24 Generic drug: Dapagliflozin Pro-metFORMIN ER Take 1 tablet by mouth daily.        Allergies: No Known Allergies  Family History: Family History  Problem Relation Age of Onset   Prostate cancer Brother    Crohn's disease Cousin    Colon cancer Neg Hx     Social History:  reports that she has quit smoking. Her smoking use included cigarettes. She has a 1.5 pack-year smoking history. She has never used smokeless tobacco. She reports that she does not drink alcohol and does not use drugs.  ROS: All other review of systems were reviewed and are negative except what is noted above in HPI  Physical Exam: BP (!) 153/81   Pulse 60   Constitutional:  Alert and oriented, No acute distress. HEENT: [place] AT, moist mucus membranes.  Trachea midline, no masses. Cardiovascular: No clubbing, cyanosis, or edema. Respiratory: Normal respiratory effort, no increased work of breathing. GI: Abdomen is soft, nontender, nondistended, no abdominal masses GU: No CVA tenderness.  Lymph: No cervical or inguinal lymphadenopathy. Skin: No rashes, bruises or suspicious lesions. Neurologic: Grossly intact, no focal deficits, moving all 4 extremities. Psychiatric: Normal mood and affect.  Laboratory Data: Lab Results  Component Value Date   WBC 13.2 (H) 11/02/2022   HGB 11.8 (L) 11/02/2022   HCT 35.5 (L) 11/02/2022   MCV 94.2 11/02/2022   PLT 240 11/02/2022    Lab Results  Component Value Date   CREATININE 0.83 11/02/2022    No results found for: "PSA"  No results found for: "TESTOSTERONE"  Lab Results  Component Value Date   HGBA1C 4.8 10/28/2022    Urinalysis    Component Value Date/Time   COLORURINE BROWN (A) 10/26/2020 1622   APPEARANCEUR Clear 02/02/2023 1111   LABSPEC 1.023 10/26/2020 1622   PHURINE 6.0  10/26/2020 1622   GLUCOSEU Negative 02/02/2023 1111   HGBUR LARGE (A) 10/26/2020 1622   BILIRUBINUR Negative 02/02/2023 1111   KETONESUR NEGATIVE 10/26/2020 1622   PROTEINUR Negative 02/02/2023 1111   PROTEINUR >=300 (A) 10/26/2020 1622   UROBILINOGEN 4.0 (H) 01/22/2010 0859   NITRITE Negative 02/02/2023 1111   NITRITE NEGATIVE 10/26/2020 1622   LEUKOCYTESUR 1+ (A) 02/02/2023 1111   LEUKOCYTESUR MODERATE (A) 10/26/2020 1622    Lab Results  Component Value Date   LABMICR See below: 02/02/2023   WBCUA 0-5 02/02/2023   LABEPIT 0-10 02/02/2023   MUCUS Present 12/19/2020   BACTERIA None seen 02/02/2023    Pertinent Imaging: *** Results for orders placed during the hospital encounter of 02/10/10  DG Abd 1 View  Narrative Clinical Data: Left renal calculus  ABDOMEN - 1 VIEW  Comparison: None Correlation:  CT abdomen  pelvis 01/22/2010  Findings: Surgical clip left pelvis. Elongated calcification left pelvis, corresponding to a vascular calcification seen on preceding CT. Facet degenerative changes lower lumbar spine. No definite urinary tract calcification identified. Bowel gas pattern normal. No acute bony findings.  IMPRESSION: No definite urinary tract calcification identified.  Provider: Wilkie Aye  No results found for this or any previous visit.  No results found for this or any previous visit.  No results found for this or any previous visit.  Results for orders placed during the hospital encounter of 01/17/23  Ultrasound renal complete  Narrative CLINICAL DATA:  Left hydronephrosis.  UPJ obstruction.  EXAM: RENAL / URINARY TRACT ULTRASOUND COMPLETE  COMPARISON:  Ultrasound July 07, 2022  FINDINGS: Right Kidney:  Renal measurements: 11.1 x 5 x 4.8 cm = volume: 139 mL. Echogenicity within normal limits. No mass or hydronephrosis visualized.  Left Kidney:  Renal measurements: 16.5 x 8.4 x 7.8 cm = volume: 571 mL. Moderate to severe  hydronephrosis with cortical thinning is essentially unchanged.  Bladder:  The bladder was empty and could not be evaluated.  Other:  None.  IMPRESSION: 1. Moderate to severe left hydronephrosis with cortical thinning is essentially unchanged. 2. The right kidney is normal. 3. The bladder was empty and could not be evaluated.   Electronically Signed By: Gerome Sam III M.D. On: 01/17/2023 15:18  No valid procedures specified. No results found for this or any previous visit.  No results found for this or any previous visit.   Assessment & Plan:    1. Hydronephrosis, unspecified hydronephrosis type -We discussed observation versus cystoscopy, left retrograde, diagnostic ureteroscopy and possible balloon dilation of ureteral stricture   No follow-ups on file.  Wilkie Aye, MD  [facility] [place]

## 2023-05-09 NOTE — H&P (View-Only) (Signed)
05/09/2023 11:19 AM   Sophia Young 02-03-1956 [id number]  Referring provider: Benita Stabile, MD [address] [place],  [place] [address]  No chief complaint on file.   HPI:    PMH: Past Medical History:  Diagnosis Date   Chronic back pain    Complication of anesthesia    Diabetes mellitus without complication (HCC)    History of kidney stones    Hydronephrosis of left kidney    chronic   Hypercholesteremia    Hypertension    PONV (postoperative nausea and vomiting)    Ureteral stricture, left     Surgical History: Past Surgical History:  Procedure Laterality Date   ABDOMINAL HYSTERECTOMY     BACK SURGERY     BIOPSY  06/01/2017   Procedure: BIOPSY;  Surgeon: Corbin Ade, MD;  [facility];  Service: Endoscopy;;  colon   CHOLECYSTECTOMY     COLONOSCOPY N/A 06/01/2017   Procedure: COLONOSCOPY;  Surgeon: Corbin Ade, MD;  [facility];  Service: Endoscopy;  Laterality: N/A;  7:30 AM   COLONOSCOPY WITH PROPOFOL N/A 02/27/2021   Procedure: COLONOSCOPY WITH PROPOFOL;  Surgeon: Lanelle Bal, DO;  [facility];  Service: Endoscopy;  Laterality: N/A;  10:30am   CYSTOSCOPY W/ RETROGRADES Bilateral 11/06/2020   Procedure: CYSTOSCOPY WITH RETROGRADE PYELOGRAM;  Surgeon: Malen Gauze, MD;  [facility];  Service: Urology;  Laterality: Bilateral;   CYSTOSCOPY W/ URETERAL STENT PLACEMENT Left 11/06/2020   Procedure: CYSTOSCOPY WITH STENT REPLACEMENT;  Surgeon: Malen Gauze, MD;  [facility];  Service: Urology;  Laterality: Left;   FLEXIBLE SIGMOIDOSCOPY N/A 06/03/2021   Procedure: FLEXIBLE SIGMOIDOSCOPY;  Surgeon: Andria Meuse, MD;  [facility];  Service: General;  Laterality: N/A;   POLYPECTOMY  06/01/2017   Procedure: POLYPECTOMY;  Surgeon: Corbin Ade, MD;  [facility];  Service: Endoscopy;;  colon    ROBOT ASSISTED PYELOPLASTY Left 11/01/2022   Procedure: XI ROBOTIC ASSISTED  PYELOPLASTY WITH URETERAL STENT PLACEMENT;  Surgeon: Malen Gauze, MD;  [facility];  Service: Urology;  Laterality: Left;   SP DIL URETER Left 2011   URETERAL STENT PLACEMENT Left 2011   URETEROSCOPY Left 11/06/2020   Procedure: URETEROSCOPY- diagnostic;  Surgeon: Malen Gauze, MD;  [facility];  Service: Urology;  Laterality: Left;    Home Medications:  Allergies as of 05/09/2023   No Known Allergies      Medication List        Accurate as of May 09, 2023 11:19 AM. If you have any questions, ask your nurse or doctor.          aspirin 81 MG chewable tablet Chew 81 mg by mouth daily.   Centrum Silver 50+Women Tabs Take 1 tablet by mouth 4 (four) times a week.   cholecalciferol 25 MCG (1000 UNIT) tablet Commonly known as: VITAMIN D3 Take 1,000 Units by mouth 4 (four) times a week.   levothyroxine 75 MCG tablet Commonly known as: SYNTHROID Take 75 mcg by mouth daily before breakfast.   losartan 25 MG tablet Commonly known as: COZAAR Take 50 mg by mouth daily.   metFORMIN 500 MG (MOD) 24 hr tablet Commonly known as: GLUMETZA Take 500 mg by mouth.   ondansetron 4 MG tablet Commonly known as: Zofran Take 1 tablet (4 mg total) by mouth daily as needed for nausea or vomiting.   oxyCODONE-acetaminophen 5-325 MG tablet Commonly  known as: Percocet Take 1 tablet by mouth every 4 (four) hours as needed for severe pain.   Ozempic (1 MG/DOSE) 4 MG/3ML Sopn Generic drug: Semaglutide (1 MG/DOSE) Inject 1 mg into the skin every 7 (seven) days.   pravastatin 40 MG tablet Commonly known as: PRAVACHOL Take 40 mg by mouth daily.   sulfamethoxazole-trimethoprim 800-160 MG tablet Commonly known as: BACTRIM DS Take 1 tablet by mouth every 12 (twelve) hours.   sulfamethoxazole-trimethoprim 800-160 MG tablet Commonly known as: BACTRIM DS Take 1 tablet by mouth daily. To start daily after completing the bactrim twice daily for 1 week.  Please continue  daily until 1 week after surgery.   triamterene-hydrochlorothiazide 37.5-25 MG capsule Commonly known as: DYAZIDE Take 1 capsule by mouth daily.   Xigduo XR 06-999 MG Tb24 Generic drug: Dapagliflozin Pro-metFORMIN ER Take 1 tablet by mouth daily.        Allergies: No Known Allergies  Family History: Family History  Problem Relation Age of Onset   Prostate cancer Brother    Crohn's disease Cousin    Colon cancer Neg Hx     Social History:  reports that she has quit smoking. Her smoking use included cigarettes. She has a 1.5 pack-year smoking history. She has never used smokeless tobacco. She reports that she does not drink alcohol and does not use drugs.  ROS: All other review of systems were reviewed and are negative except what is noted above in HPI  Physical Exam: BP (!) 153/81   Pulse 60   Constitutional:  Alert and oriented, No acute distress. HEENT: [place] AT, moist mucus membranes.  Trachea midline, no masses. Cardiovascular: No clubbing, cyanosis, or edema. Respiratory: Normal respiratory effort, no increased work of breathing. GI: Abdomen is soft, nontender, nondistended, no abdominal masses GU: No CVA tenderness.  Lymph: No cervical or inguinal lymphadenopathy. Skin: No rashes, bruises or suspicious lesions. Neurologic: Grossly intact, no focal deficits, moving all 4 extremities. Psychiatric: Normal mood and affect.  Laboratory Data: Lab Results  Component Value Date   WBC 13.2 (H) 11/02/2022   HGB 11.8 (L) 11/02/2022   HCT 35.5 (L) 11/02/2022   MCV 94.2 11/02/2022   PLT 240 11/02/2022    Lab Results  Component Value Date   CREATININE 0.83 11/02/2022    No results found for: "PSA"  No results found for: "TESTOSTERONE"  Lab Results  Component Value Date   HGBA1C 4.8 10/28/2022    Urinalysis    Component Value Date/Time   COLORURINE BROWN (A) 10/26/2020 1622   APPEARANCEUR Clear 02/02/2023 1111   LABSPEC 1.023 10/26/2020 1622   PHURINE 6.0  10/26/2020 1622   GLUCOSEU Negative 02/02/2023 1111   HGBUR LARGE (A) 10/26/2020 1622   BILIRUBINUR Negative 02/02/2023 1111   KETONESUR NEGATIVE 10/26/2020 1622   PROTEINUR Negative 02/02/2023 1111   PROTEINUR >=300 (A) 10/26/2020 1622   UROBILINOGEN 4.0 (H) 01/22/2010 0859   NITRITE Negative 02/02/2023 1111   NITRITE NEGATIVE 10/26/2020 1622   LEUKOCYTESUR 1+ (A) 02/02/2023 1111   LEUKOCYTESUR MODERATE (A) 10/26/2020 1622    Lab Results  Component Value Date   LABMICR See below: 02/02/2023   WBCUA 0-5 02/02/2023   LABEPIT 0-10 02/02/2023   MUCUS Present 12/19/2020   BACTERIA None seen 02/02/2023    Pertinent Imaging: *** Results for orders placed during the hospital encounter of 02/10/10  DG Abd 1 View  Narrative Clinical Data: Left renal calculus  ABDOMEN - 1 VIEW  Comparison: None Correlation:  CT abdomen  pelvis 01/22/2010  Findings: Surgical clip left pelvis. Elongated calcification left pelvis, corresponding to a vascular calcification seen on preceding CT. Facet degenerative changes lower lumbar spine. No definite urinary tract calcification identified. Bowel gas pattern normal. No acute bony findings.  IMPRESSION: No definite urinary tract calcification identified.  Provider: Wilkie Aye  No results found for this or any previous visit.  No results found for this or any previous visit.  No results found for this or any previous visit.  Results for orders placed during the hospital encounter of 01/17/23  Ultrasound renal complete  Narrative CLINICAL DATA:  Left hydronephrosis.  UPJ obstruction.  EXAM: RENAL / URINARY TRACT ULTRASOUND COMPLETE  COMPARISON:  Ultrasound July 07, 2022  FINDINGS: Right Kidney:  Renal measurements: 11.1 x 5 x 4.8 cm = volume: 139 mL. Echogenicity within normal limits. No mass or hydronephrosis visualized.  Left Kidney:  Renal measurements: 16.5 x 8.4 x 7.8 cm = volume: 571 mL. Moderate to severe  hydronephrosis with cortical thinning is essentially unchanged.  Bladder:  The bladder was empty and could not be evaluated.  Other:  None.  IMPRESSION: 1. Moderate to severe left hydronephrosis with cortical thinning is essentially unchanged. 2. The right kidney is normal. 3. The bladder was empty and could not be evaluated.   Electronically Signed By: Gerome Sam III M.D. On: 01/17/2023 15:18  No valid procedures specified. No results found for this or any previous visit.  No results found for this or any previous visit.   Assessment & Plan:    1. Hydronephrosis, unspecified hydronephrosis type -We discussed observation versus cystoscopy, left retrograde, diagnostic ureteroscopy and possible balloon dilation of ureteral stricture   No follow-ups on file.  Wilkie Aye, MD  [facility] [place]

## 2023-05-09 NOTE — Patient Instructions (Signed)
Hydronephrosis  Hydronephrosis is the swelling of one or both kidneys due to a blockage that stops urine from flowing out of the body. Kidneys filter waste from the blood and produce urine. This condition can lead to kidney failure and may become life-threatening if not treated promptly. What are the causes? In infants and children, common causes include problems that occur when a baby is developing in the womb. These can include problems in the kidneys or in the tubes that drain urine into the bladder (ureters). In adults, common causes include: Kidney stones. Pregnancy. A tumor or cyst in the abdomen or pelvis. An enlarged prostate gland. Other causes include: Bladder infection. Scar tissue from a previous surgery or injury. A blood clot. Cancer of the prostate, bladder, uterus, ovary, or colon. What are the signs or symptoms? Symptoms of this condition include: Pain or discomfort in your side (flank) or abdomen. Swelling in your abdomen. Nausea and vomiting. Fever. Pain when passing urine. Feelings of urgency when you need to urinate. Urinating more often than normal. In some cases, you may not have any symptoms. How is this diagnosed? This condition may be diagnosed based on: Your symptoms and medical history. A physical exam. Blood and urine tests. Imaging tests, such as an ultrasound, CT scan, or MRI. A procedure to look at your urinary tract and bladder by inserting a scope into the urethra (cystoscopy). How is this treated? Treatment for this condition depends on where the blockage is, how long it has been there, and what caused it. The goal of treatment is to remove the blockage. Treatment may include: Antibiotic medicines to treat or prevent infection. A procedure to place a small, thin tube (stent) into a blocked ureter. The stent will keep the ureter open so that urine can drain through it. A nonsurgical procedure that crushes kidney stones with shock waves  (extracorporeal shock wave lithotripsy). If kidney failure occurs, treatment may include dialysis or a kidney transplant. Follow these instructions at home:  Take over-the-counter and prescription medicines only as told by your health care provider. If you were prescribed an antibiotic medicine, take it exactly as told by your health care provider. Do not stop taking the antibiotic even if you start to feel better. Rest and return to your normal activities as told by your health care provider. Ask your health care provider what activities are safe for you. Drink enough fluid to keep your urine pale yellow. Keep all follow-up visits. This is important. Contact a health care provider if: You continue to have symptoms after treatment. You develop new symptoms. Your urine becomes cloudy or bloody. You have a fever. Get help right away if: You have severe flank or abdominal pain. You cannot drink fluids without vomiting. Summary Hydronephrosis is the swelling of one or both kidneys due to a blockage that stops urine from flowing out of the body. Hydronephrosis can lead to kidney failure and may become life-threatening if not treated promptly. The goal of treatment is to remove the blockage. It may include a procedure to insert a stent into a blocked ureter, a procedure to break up kidney stones, or taking antibiotic medicines. Follow your health care provider's instructions for taking care of yourself at home, including instructions about drinking fluids, taking medicines, and limiting activities. This information is not intended to replace advice given to you by your health care provider. Make sure you discuss any questions you have with your health care provider. Document Revised: 12/18/2019 Document Reviewed: 12/18/2019 Elsevier   Patient Education  2024 [employer]  

## 2023-05-27 NOTE — Patient Instructions (Addendum)
Your procedure is scheduled on: 06/02/2023  Report to [facility] Main Entrance at  9:00   AM.  Call this number if you have problems the morning of surgery: [phone]   Remember:   Do not Eat or Drink after midnight         No Smoking the morning of surgery Hold Ozempic injection 7 days  :  Take these medicines the morning of surgery with A SIP OF WATER: Levothyroxine Percocet and/or Zofran if needed    Do not wear jewelry, make-up or nail polish.  Do not wear lotions, powders, or perfumes. You may wear deodorant.  Do not shave 48 hours prior to surgery. Men may shave face and neck.  Do not bring valuables to the hospital.  Contacts, dentures or bridgework may not be worn into surgery.  Leave suitcase in the car. After surgery it may be brought to your room.  For patients admitted to the hospital, checkout time is 11:00 AM the day of discharge.   Patients discharged the day of surgery will not be allowed to drive home.    Special Instructions: Shower using CHG night before surgery and shower the day of surgery use CHG.  Use special wash - you have one bottle of CHG for all showers.  You should use approximately 1/2 of the bottle for each shower. How to Use Chlorhexidine Before Surgery Chlorhexidine gluconate (CHG) is a germ-killing (antiseptic) solution that is used to clean the skin. It can get rid of the bacteria that normally live on the skin and can keep them away for about 24 hours. To clean your skin with CHG, you may be given: A CHG solution to use in the shower or as part of a sponge bath. A prepackaged cloth that contains CHG. Cleaning your skin with CHG may help lower the risk for infection: While you are staying in the intensive care unit of the hospital. If you have a vascular access, such as a central line, to provide short-term or long-term access to your veins. If you have a catheter to drain urine from your bladder. If you are on a ventilator. A ventilator is a  machine that helps you breathe by moving air in and out of your lungs. After surgery. What are the risks? Risks of using CHG include: A skin reaction. Hearing loss, if CHG gets in your ears and you have a perforated eardrum. Eye injury, if CHG gets in your eyes and is not rinsed out. The CHG product catching fire. Make sure that you avoid smoking and flames after applying CHG to your skin. Do not use CHG: If you have a chlorhexidine allergy or have previously reacted to chlorhexidine. On babies younger than 50 months of age. How to use CHG solution Use CHG only as told by your health care provider, and follow the instructions on the label. Use the full amount of CHG as directed. Usually, this is one bottle. During a shower Follow these steps when using CHG solution during a shower (unless your health care provider gives you different instructions): Start the shower. Use your normal soap and shampoo to wash your face and hair. Turn off the shower or move out of the shower stream. Pour the CHG onto a clean washcloth. Do not use any type of brush or rough-edged sponge. Starting at your neck, lather your body down to your toes. Make sure you follow these instructions: If you will be having surgery, pay special attention to the part  of your body where you will be having surgery. Scrub this area for at least 1 minute. Do not use CHG on your head or face. If the solution gets into your ears or eyes, rinse them well with water. Avoid your genital area. Avoid any areas of skin that have broken skin, cuts, or scrapes. Scrub your back and under your arms. Make sure to wash skin folds. Let the lather sit on your skin for 1-2 minutes or as long as told by your health care provider. Thoroughly rinse your entire body in the shower. Make sure that all body creases and crevices are rinsed well. Dry off with a clean towel. Do not put any substances on your body afterward--such as powder, lotion, or  perfume--unless you are told to do so by your health care provider. Only use lotions that are recommended by the manufacturer. Put on clean clothes or pajamas. If it is the night before your surgery, sleep in clean sheets.  During a sponge bath Follow these steps when using CHG solution during a sponge bath (unless your health care provider gives you different instructions): Use your normal soap and shampoo to wash your face and hair. Pour the CHG onto a clean washcloth. Starting at your neck, lather your body down to your toes. Make sure you follow these instructions: If you will be having surgery, pay special attention to the part of your body where you will be having surgery. Scrub this area for at least 1 minute. Do not use CHG on your head or face. If the solution gets into your ears or eyes, rinse them well with water. Avoid your genital area. Avoid any areas of skin that have broken skin, cuts, or scrapes. Scrub your back and under your arms. Make sure to wash skin folds. Let the lather sit on your skin for 1-2 minutes or as long as told by your health care provider. Using a different clean, wet washcloth, thoroughly rinse your entire body. Make sure that all body creases and crevices are rinsed well. Dry off with a clean towel. Do not put any substances on your body afterward--such as powder, lotion, or perfume--unless you are told to do so by your health care provider. Only use lotions that are recommended by the manufacturer. Put on clean clothes or pajamas. If it is the night before your surgery, sleep in clean sheets. How to use CHG prepackaged cloths Only use CHG cloths as told by your health care provider, and follow the instructions on the label. Use the CHG cloth on clean, dry skin. Do not use the CHG cloth on your head or face unless your health care provider tells you to. When washing with the CHG cloth: Avoid your genital area. Avoid any areas of skin that have broken  skin, cuts, or scrapes. Before surgery Follow these steps when using a CHG cloth to clean before surgery (unless your health care provider gives you different instructions): Using the CHG cloth, vigorously scrub the part of your body where you will be having surgery. Scrub using a back-and-forth motion for 3 minutes. The area on your body should be completely wet with CHG when you are done scrubbing. Do not rinse. Discard the cloth and let the area air-dry. Do not put any substances on the area afterward, such as powder, lotion, or perfume. Put on clean clothes or pajamas. If it is the night before your surgery, sleep in clean sheets.  For general bathing Follow these steps when using  CHG cloths for general bathing (unless your health care provider gives you different instructions). Use a separate CHG cloth for each area of your body. Make sure you wash between any folds of skin and between your fingers and toes. Wash your body in the following order, switching to a new cloth after each step: The front of your neck, shoulders, and chest. Both of your arms, under your arms, and your hands. Your stomach and groin area, avoiding the genitals. Your right leg and foot. Your left leg and foot. The back of your neck, your back, and your buttocks. Do not rinse. Discard the cloth and let the area air-dry. Do not put any substances on your body afterward--such as powder, lotion, or perfume--unless you are told to do so by your health care provider. Only use lotions that are recommended by the manufacturer. Put on clean clothes or pajamas. Contact a health care provider if: Your skin gets irritated after scrubbing. You have questions about using your solution or cloth. You swallow any chlorhexidine. Call your local poison control center ([phone] in the [place] Get help right away if: Your eyes itch badly, or they become very red or swollen. Your skin itches badly and is red or swollen. Your  hearing changes. You have trouble seeing. You have swelling or tingling in your mouth or throat. You have trouble breathing. These symptoms may represent a serious problem that is an emergency. Do not wait to see if the symptoms will go away. Get medical help right away. Call your local emergency services (911 in the [place] Do not drive yourself to the hospital. Summary Chlorhexidine gluconate (CHG) is a germ-killing (antiseptic) solution that is used to clean the skin. Cleaning your skin with CHG may help to lower your risk for infection. You may be given CHG to use for bathing. It may be in a bottle or in a prepackaged cloth to use on your skin. Carefully follow your health care provider's instructions and the instructions on the product label. Do not use CHG if you have a chlorhexidine allergy. Contact your health care provider if your skin gets irritated after scrubbing. This information is not intended to replace advice given to you by your health care provider. Make sure you discuss any questions you have with your health care provider. Document Revised: 12/28/2021 Document Reviewed: 11/10/2020 Elsevier Patient Education  2023 [employer] Ureteral Stent Implantation, Care After The following information offers guidance on how to care for yourself after your procedure. Your health care provider may also give you more specific instructions. If you have problems or questions, contact your health care provider. What can I expect after the procedure? After the procedure, it is common to have: Nausea. Mild pain when you urinate. You may feel this pain in your lower back or lower abdomen. The pain should stop within a few minutes after you urinate. This pattern may last for up to 1 week. A small amount of blood in your urine for several days. Follow these instructions at home: Medicines Take over-the-counter and prescription medicines only as told by your health care provider. If you were  prescribed antibiotics, take them as told by your health care provider. Do not stop using the antibiotic even if you start to feel better. If you were given a sedative during the procedure, it can affect you for several hours. Do not drive or operate machinery until your health care provider says that it is safe. Ask your health care provider if the  medicine prescribed to you: Requires you to avoid driving or using machinery. Can cause constipation. You may need to take these actions to prevent or treat constipation: Take over-the-counter or prescription medicines. Eat foods that are high in fiber, such as beans, whole grains, and fresh fruits and vegetables. Limit foods that are high in fat and processed sugars, such as fried or sweet foods. Activity Rest as told by your health care provider. Do not sit for a long time without moving. Get up to take short walks every 1-2 hours. This will improve blood flow and breathing. Ask for help if you feel weak or unsteady. Return to your normal activities as told by your health care provider. Ask your health care provider what activities are safe for you. General instructions  If you have a catheter: Follow instructions from your health care provider about taking care of your catheter and collection bag. Do not take baths, swim, or use a hot tub until your health care provider approves. Ask your health care provider if you may take showers. You may only be allowed to take sponge baths. Drink enough fluid to keep your urine pale yellow. Do not use any products that contain nicotine or tobacco. These products include cigarettes, chewing tobacco, and vaping devices, such as e-cigarettes. These can delay healing after surgery. If you need help quitting, ask your health care provider. Keep all follow-up visits. Contact a health care provider if: You start passing blood clots, or you have more than a small amount of blood in your urine. You have pain that  gets worse or does not get better with medicine, especially pain when you urinate. You have trouble urinating. You feel nauseous or you vomit again and again during a period of more than 2 days after the procedure. You have a fever. Get help right away if: You are passing blood clots that are 1 inch (2.5 cm) or larger in size. You are leaking urine (have incontinence), or you cannot urinate. The end of the stent comes out of your urethra. You have sudden, sharp, or severe pain in your abdomen or lower back. You have swelling or pain in your legs. You have trouble breathing. These symptoms may be an emergency. Get help right away. Call 911. Do not wait to see if the symptoms will go away. Do not drive yourself to the hospital. Summary After the procedure, it is common to have mild pain when you urinate that goes away within a few minutes after you urinate. This may last for up to 1 week. Take over-the-counter and prescription medicines only as told by your health care provider. Drink enough fluid to keep your urine pale yellow. Call your health care provider if you start passing blood clots, or you have more than a small amount of blood in your urine. This information is not intended to replace advice given to you by your health care provider. Make sure you discuss any questions you have with your health care provider. Document Revised: 10/05/2021 Document Reviewed: 10/05/2021 Elsevier Patient Education  2024 Else[employer] Inc. General Anesthesia, Adult, Care After The following information offers guidance on how to care for yourself after your procedure. Your health care provider may also give you more specific instructions. If you have problems or questions, contact your health care provider. What can I expect after the procedure? After the procedure, it is common for people to: Have pain or discomfort at the IV site. Have nausea or vomiting. Have a sore throat  or hoarseness. Have trouble  concentrating. Feel cold or chills. Feel weak, sleepy, or tired (fatigue). Have soreness and body aches. These can affect parts of the body that were not involved in surgery. Follow these instructions at home: For the time period you were told by your health care provider:  Rest. Do not participate in activities where you could fall or become injured. Do not drive or use machinery. Do not drink alcohol. Do not take sleeping pills or medicines that cause drowsiness. Do not make important decisions or sign legal documents. Do not take care of children on your own. General instructions Drink enough fluid to keep your urine pale yellow. If you have sleep apnea, surgery and certain medicines can increase your risk for breathing problems. Follow instructions from your health care provider about wearing your sleep device: Anytime you are sleeping, including during daytime naps. While taking prescription pain medicines, sleeping medicines, or medicines that make you drowsy. Return to your normal activities as told by your health care provider. Ask your health care provider what activities are safe for you. Take over-the-counter and prescription medicines only as told by your health care provider. Do not use any products that contain nicotine or tobacco. These products include cigarettes, chewing tobacco, and vaping devices, such as e-cigarettes. These can delay incision healing after surgery. If you need help quitting, ask your health care provider. Contact a health care provider if: You have nausea or vomiting that does not get better with medicine. You vomit every time you eat or drink. You have pain that does not get better with medicine. You cannot urinate or have bloody urine. You develop a skin rash. You have a fever. Get help right away if: You have trouble breathing. You have chest pain. You vomit blood. These symptoms may be an emergency. Get help right away. Call 911. Do not wait  to see if the symptoms will go away. Do not drive yourself to the hospital. Summary After the procedure, it is common to have a sore throat, hoarseness, nausea, vomiting, or to feel weak, sleepy, or fatigue. For the time period you were told by your health care provider, do not drive or use machinery. Get help right away if you have difficulty breathing, have chest pain, or vomit blood. These symptoms may be an emergency. This information is not intended to replace advice given to you by your health care provider. Make sure you discuss any questions you have with your health care provider. Document Revised: 11/27/2021 Document Reviewed: 11/27/2021 Elsevier Patient Education  2024 [employer].

## 2023-05-30 ENCOUNTER — Encounter (HOSPITAL_COMMUNITY): Payer: Self-pay

## 2023-05-30 ENCOUNTER — Encounter (HOSPITAL_COMMUNITY)
Admission: RE | Admit: 2023-05-30 | Discharge: 2023-05-30 | Disposition: A | Discharge status: Home / Self Care | Payer: PPO | Source: Ambulatory Visit | Attending: Urology | Admitting: Urology

## 2023-05-30 VITALS — BP 153/81 | HR 60 | Temp 97.8°F | Resp 18 | Ht 63.0 in | Wt 175.0 lb

## 2023-05-30 DIAGNOSIS — E0800 Diabetes mellitus due to underlying condition with hyperosmolarity without nonketotic hyperglycemic-hyperosmolar coma (NKHHC): Secondary | ICD-10-CM | POA: Insufficient documentation

## 2023-05-30 DIAGNOSIS — Z01812 Encounter for preprocedural laboratory examination: Secondary | ICD-10-CM | POA: Insufficient documentation

## 2023-05-30 HISTORY — DX: Hypothyroidism, unspecified: E03.9

## 2023-05-30 LAB — BASIC METABOLIC PANEL
Anion gap: 10 (ref 5–15)
BUN: 24 mg/dL — ABNORMAL HIGH (ref 8–23)
CO2: 27 mmol/L (ref 22–32)
Calcium: 9.5 mg/dL (ref 8.9–10.3)
Chloride: 101 mmol/L (ref 98–111)
Creatinine, Ser: 1.1 mg/dL — ABNORMAL HIGH (ref 0.44–1.00)
GFR, Estimated: 55 mL/min — ABNORMAL LOW (ref >60)
Glucose, Bld: 128 mg/dL — ABNORMAL HIGH (ref 70–99)
Potassium: 3.9 mmol/L (ref 3.5–5.1)
Sodium: 138 mmol/L (ref 135–145)

## 2023-06-02 ENCOUNTER — Encounter (HOSPITAL_COMMUNITY)
Admission: RE | Disposition: A | Discharge status: Home / Self Care | Payer: Self-pay | Source: Ambulatory Visit | Attending: Urology

## 2023-06-02 ENCOUNTER — Ambulatory Visit (HOSPITAL_COMMUNITY): Payer: PPO

## 2023-06-02 ENCOUNTER — Ambulatory Visit (HOSPITAL_BASED_OUTPATIENT_CLINIC_OR_DEPARTMENT_OTHER): Payer: PPO | Admitting: Certified Registered"

## 2023-06-02 ENCOUNTER — Encounter (HOSPITAL_COMMUNITY): Payer: Self-pay | Admitting: Urology

## 2023-06-02 ENCOUNTER — Ambulatory Visit (HOSPITAL_COMMUNITY)
Admission: RE | Admit: 2023-06-02 | Discharge: 2023-06-02 | Disposition: A | Discharge status: Home / Self Care | Payer: PPO | Source: Ambulatory Visit | Attending: Urology | Admitting: Urology

## 2023-06-02 ENCOUNTER — Ambulatory Visit (HOSPITAL_COMMUNITY): Payer: PPO | Admitting: Certified Registered"

## 2023-06-02 DIAGNOSIS — G8929 Other chronic pain: Secondary | ICD-10-CM | POA: Diagnosis not present

## 2023-06-02 DIAGNOSIS — I10 Essential (primary) hypertension: Secondary | ICD-10-CM | POA: Diagnosis not present

## 2023-06-02 DIAGNOSIS — N132 Hydronephrosis with renal and ureteral calculous obstruction: Secondary | ICD-10-CM | POA: Insufficient documentation

## 2023-06-02 DIAGNOSIS — Z7985 Long-term (current) use of injectable non-insulin antidiabetic drugs: Secondary | ICD-10-CM | POA: Insufficient documentation

## 2023-06-02 DIAGNOSIS — Z87891 Personal history of nicotine dependence: Secondary | ICD-10-CM | POA: Diagnosis not present

## 2023-06-02 DIAGNOSIS — Z87442 Personal history of urinary calculi: Secondary | ICD-10-CM | POA: Diagnosis not present

## 2023-06-02 DIAGNOSIS — M549 Dorsalgia, unspecified: Secondary | ICD-10-CM | POA: Insufficient documentation

## 2023-06-02 DIAGNOSIS — N1330 Unspecified hydronephrosis: Secondary | ICD-10-CM

## 2023-06-02 DIAGNOSIS — N135 Crossing vessel and stricture of ureter without hydronephrosis: Secondary | ICD-10-CM | POA: Diagnosis present

## 2023-06-02 DIAGNOSIS — Z7984 Long term (current) use of oral hypoglycemic drugs: Secondary | ICD-10-CM | POA: Insufficient documentation

## 2023-06-02 DIAGNOSIS — E119 Type 2 diabetes mellitus without complications: Secondary | ICD-10-CM | POA: Diagnosis not present

## 2023-06-02 HISTORY — PX: CYSTOSCOPY WITH RETROGRADE PYELOGRAM, URETEROSCOPY AND STENT PLACEMENT: SHX5789

## 2023-06-02 LAB — GLUCOSE, CAPILLARY
Glucose-Capillary: 98 mg/dL (ref 70–99)
Glucose-Capillary: 116 mg/dL — ABNORMAL HIGH (ref 70–99)

## 2023-06-02 SURGERY — CYSTOURETEROSCOPY, WITH RETROGRADE PYELOGRAM AND STENT INSERTION
Anesthesia: General | Site: Ureter | Laterality: Left

## 2023-06-02 MED ORDER — ONDANSETRON HCL 4 MG/2ML IJ SOLN
INTRAMUSCULAR | Status: DC | PRN
  Administered 2023-06-02: 8 mg via INTRAVENOUS

## 2023-06-02 MED ORDER — PROPOFOL 500 MG/50ML IV EMUL
INTRAVENOUS | Status: AC
  Filled 2023-06-02: qty 50

## 2023-06-02 MED ORDER — DEXAMETHASONE SODIUM PHOSPHATE 10 MG/ML IJ SOLN
INTRAMUSCULAR | Status: AC
  Filled 2023-06-02: qty 1

## 2023-06-02 MED ORDER — DEXAMETHASONE SODIUM PHOSPHATE 10 MG/ML IJ SOLN
INTRAMUSCULAR | Status: DC | PRN
  Administered 2023-06-02: 10 mg via INTRAVENOUS

## 2023-06-02 MED ORDER — ONDANSETRON HCL 4 MG/2ML IJ SOLN
INTRAMUSCULAR | Status: AC
  Filled 2023-06-02: qty 4

## 2023-06-02 MED ORDER — CHLORHEXIDINE GLUCONATE 0.12 % MT SOLN
OROMUCOSAL | Status: AC
  Filled 2023-06-02: qty 15

## 2023-06-02 MED ORDER — PROPOFOL 10 MG/ML IV BOLUS
INTRAVENOUS | Status: DC | PRN
  Administered 2023-06-02: 180 mg via INTRAVENOUS

## 2023-06-02 MED ORDER — PROPOFOL 10 MG/ML IV BOLUS
INTRAVENOUS | Status: AC
  Filled 2023-06-02: qty 20

## 2023-06-02 MED ORDER — MIDAZOLAM HCL 2 MG/2ML IJ SOLN
INTRAMUSCULAR | Status: AC
  Filled 2023-06-02: qty 2

## 2023-06-02 MED ORDER — SODIUM CHLORIDE 0.9 % IR SOLN
Status: DC | PRN
  Administered 2023-06-02 (×2): 3000 mL

## 2023-06-02 MED ORDER — WATER FOR IRRIGATION, STERILE IR SOLN
Status: DC | PRN
  Administered 2023-06-02: 500 mL

## 2023-06-02 MED ORDER — LIDOCAINE HCL (PF) 2 % IJ SOLN
INTRAMUSCULAR | Status: AC
  Filled 2023-06-02: qty 5

## 2023-06-02 MED ORDER — PROPOFOL 500 MG/50ML IV EMUL
INTRAVENOUS | Status: DC | PRN
Start: 2023-06-02 — End: 2023-06-02
  Administered 2023-06-02 (×2): 150 ug/kg/min via INTRAVENOUS

## 2023-06-02 MED ORDER — DIATRIZOATE MEGLUMINE 30 % UR SOLN
URETHRAL | Status: DC | PRN
  Administered 2023-06-02: 20 mL via URETHRAL

## 2023-06-02 MED ORDER — MIDAZOLAM HCL 2 MG/2ML IJ SOLN
INTRAMUSCULAR | Status: DC | PRN
  Administered 2023-06-02: 2 mg via INTRAVENOUS

## 2023-06-02 MED ORDER — DIATRIZOATE MEGLUMINE 30 % UR SOLN
URETHRAL | Status: AC
  Filled 2023-06-02: qty 100

## 2023-06-02 MED ORDER — CEFAZOLIN SODIUM-DEXTROSE 2-4 GM/100ML-% IV SOLN
2.0000 g | INTRAVENOUS | Status: AC
  Administered 2023-06-02: 2 g via INTRAVENOUS
  Filled 2023-06-02: qty 100

## 2023-06-02 MED ORDER — OXYCODONE-ACETAMINOPHEN 5-325 MG PO TABS
1.0000 | ORAL_TABLET | ORAL | 0 refills | Status: AC | PRN
Start: 2023-06-02 — End: 2024-06-01

## 2023-06-02 MED ORDER — FENTANYL CITRATE (PF) 100 MCG/2ML IJ SOLN
INTRAMUSCULAR | Status: AC
  Filled 2023-06-02: qty 2

## 2023-06-02 MED ORDER — LIDOCAINE HCL (CARDIAC) PF 100 MG/5ML IV SOSY
PREFILLED_SYRINGE | INTRAVENOUS | Status: DC | PRN
  Administered 2023-06-02: 80 mg via INTRAVENOUS

## 2023-06-02 MED ORDER — FENTANYL CITRATE (PF) 250 MCG/5ML IJ SOLN
INTRAMUSCULAR | Status: DC | PRN
  Administered 2023-06-02 (×2): 25 ug via INTRAVENOUS

## 2023-06-02 MED ORDER — LACTATED RINGERS IV SOLN
INTRAVENOUS | Status: DC | PRN
Start: 2023-06-02 — End: 2023-06-02

## 2023-06-02 SURGICAL SUPPLY — 23 items
BAG DRAIN URO TABLE W/ADPT NS (BAG) ×2 IMPLANT
BAG DRN 8 ADPR NS SKTRN CSTL (BAG) ×2
BAG HAMPER (MISCELLANEOUS) ×2 IMPLANT
CATH INTERMIT  6FR 70CM (CATHETERS) ×2 IMPLANT
CLOTH BEACON ORANGE TIMEOUT ST (SAFETY) ×2 IMPLANT
GLOVE BIO SURGEON STRL SZ8 (GLOVE) ×2 IMPLANT
GLOVE BIOGEL PI IND STRL 6.5 (GLOVE) ×1 IMPLANT
GLOVE BIOGEL PI IND STRL 7.0 (GLOVE) ×3 IMPLANT
GOWN STRL REUS W/TWL LRG LVL3 (GOWN DISPOSABLE) ×2 IMPLANT
GOWN STRL REUS W/TWL XL LVL3 (GOWN DISPOSABLE) ×2 IMPLANT
GUIDEWIRE STR DUAL SENSOR (WIRE) ×2 IMPLANT
GUIDEWIRE STR ZIPWIRE 035X150 (MISCELLANEOUS) ×2 IMPLANT
IV NS IRRIG 3000ML ARTHROMATIC (IV SOLUTION) ×4 IMPLANT
KIT TURNOVER CYSTO (KITS) ×2 IMPLANT
MANIFOLD NEPTUNE II (INSTRUMENTS) ×2 IMPLANT
PACK CYSTO (CUSTOM PROCEDURE TRAY) ×2 IMPLANT
PAD ARMBOARD 7.5X6 YLW CONV (MISCELLANEOUS) ×2 IMPLANT
POSITIONER HEAD 8X9X4 ADT (SOFTGOODS) ×2 IMPLANT
STENT CONTOUR 7FRX26 (STENTS) ×1 IMPLANT
SYR 10ML LL (SYRINGE) ×2 IMPLANT
SYR CONTROL 10ML LL (SYRINGE) ×2 IMPLANT
TOWEL OR 17X26 4PK STRL BLUE (TOWEL DISPOSABLE) ×2 IMPLANT
WATER STERILE IRR 500ML POUR (IV SOLUTION) ×2 IMPLANT

## 2023-06-02 NOTE — Anesthesia Preprocedure Evaluation (Signed)
Anesthesia Evaluation  Patient identified by MRN, date of birth, ID band Patient awake    Reviewed: Allergy & Precautions, H&P , NPO status , Patient's Chart, lab work & pertinent test results, reviewed documented beta blocker date and time   History of Anesthesia Complications (+) PONV and history of anesthetic complications  Airway Mallampati: II  TM Distance: >3 FB Neck ROM: full    Dental no notable dental hx.    Pulmonary neg pulmonary ROS, former smoker   Pulmonary exam normal breath sounds clear to auscultation       Cardiovascular Exercise Tolerance: Good hypertension, negative cardio ROS  Rhythm:regular Rate:Normal     Neuro/Psych negative neurological ROS  negative psych ROS   GI/Hepatic negative GI ROS, Neg liver ROS,,,  Endo/Other  negative endocrine ROSdiabetesHypothyroidism    Renal/GU Renal diseasenegative Renal ROS  negative genitourinary   Musculoskeletal   Abdominal   Peds  Hematology negative hematology ROS (+)   Anesthesia Other Findings   Reproductive/Obstetrics negative OB ROS                             Anesthesia Physical Anesthesia Plan  ASA: 2  Anesthesia Plan: General and General LMA   Post-op Pain Management:    Induction:   PONV Risk Score and Plan: Ondansetron  Airway Management Planned:   Additional Equipment:   Intra-op Plan:   Post-operative Plan:   Informed Consent: I have reviewed the patients History and Physical, chart, labs and discussed the procedure including the risks, benefits and alternatives for the proposed anesthesia with the patient or authorized representative who has indicated his/her understanding and acceptance.     Dental Advisory Given  Plan Discussed with: CRNA  Anesthesia Plan Comments:        Anesthesia Quick Evaluation

## 2023-06-02 NOTE — Op Note (Addendum)
Preoperative diagnosis: Left hydronephrosis  Postoperative diagnosis: Same  Procedure: 1 cystoscopy 2.  left retrograde pyelography 3.  Intraoperative fluoroscopy, under one hour, with interpretation 4.  Left diagnostic ureteroscopy 5. Left 7x26 JJ ureteral stent placement  Attending: Wilkie Aye  Anesthesia: General  Estimated blood loss: None  Drains: left 7x26 UU ureteral stent  Specimens: none  Antibiotics: ancef  Findings: Severe left hydronephrosis to the UPJ. 8 Fredlund opening at UPJ on ureteroscopy.  Indications: Patient is a 67 year old female with a history of worsening hydronephrosis after left pyeloplasty.  After discussing treatment options, she decided proceed with left diagnostic ureteroscopy  Procedure in detail: The patient was brought to the operating room and a brief timeout was done to ensure correct patient, correct procedure, correct site.  General anesthesia was administered patient was placed in dorsal lithotomy position.  Her genitalia was then prepped and draped in usual sterile fashion.  A rigid 22 Stoklosa cystoscope was passed in the urethra and the bladder.  Bladder was inspected free masses or lesions.  the right ureteral orifices were in the normal orthotopic locations.  a 6 Rallis ureteral catheter was then instilled into the left ureter orifice.  a gentle retrograde was obtained and findings noted above.  we then placed a zip wire through the ureteral catheter and advanced up to the renal pelvis.  we then removed the cystoscope and cannulated the left ureteral orifice with a semirigid ureteroscope.  we then performed ureteroscopy up to the level of the UPJ and into the renal pelvis. We encountered an 8 Atkins UPJ with mild scarring. We then elected to place a stent over the original zipwire. We advanced a 7x26 Ureteral stent up the the renal pelvis. The wire was removed and good coiling was not in the renal pelvis under fluoroscopy and the bladder under  direct vision.  the bladder was then drained and this concluded the procedure which was well tolerated by patient.  Complications: None  Condition: Stable, extubated, transferred to PACU  Plan: Pt is to followup in 4 weeks with a repeat lasix renogram

## 2023-06-02 NOTE — Anesthesia Procedure Notes (Addendum)
Procedure Name: LMA Insertion Date/Time: 06/02/2023 9:06 AM  Performed by: Oletha Cruel, CRNAPre-anesthesia Checklist: Patient identified, Emergency Drugs available, Suction available and Patient being monitored Patient Re-evaluated:Patient Re-evaluated prior to induction Oxygen Delivery Method: Circle System Utilized Preoxygenation: Pre-oxygenation with 100% oxygen Induction Type: IV induction Ventilation: Mask ventilation without difficulty LMA: LMA inserted LMA Size: 4.0 Number of attempts: 1 Placement Confirmation: positive ETCO2 Tube secured with: Tape Dental Injury: Teeth and Oropharynx as per pre-operative assessment

## 2023-06-02 NOTE — Transfer of Care (Signed)
Immediate Anesthesia Transfer of Care Note  Patient: Sophia Young  Procedure(s) Performed: CYSTOSCOPY WITH RETROGRADE PYELOGRAM, DIAGNOSTIC URETEROSCOPY AND STENT PLACEMENT (Left: Ureter)  Patient Location: PACU  Anesthesia Type:General  Level of Consciousness: drowsy and patient cooperative  Airway & Oxygen Therapy: Patient Spontanous Breathing and Patient connected to face mask oxygen  Post-op Assessment: Report given to RN and Post -op Vital signs reviewed and stable  Post vital signs: Reviewed and stable  Last Vitals:  Vitals Value Taken Time  BP 128/67 06/02/23 0948  Temp 97.5 06/02/23  0948  Pulse 64 06/02/23 0951  Resp 18 06/02/23 0951  SpO2 100 % 06/02/23 0951  Vitals shown include unfiled device data.  Last Pain:  Vitals:   06/02/23 0654  TempSrc: Oral  PainSc: 0-No pain         Complications: No notable events documented.

## 2023-06-02 NOTE — Interval H&P Note (Signed)
History and Physical Interval Note:  06/02/2023 8:42 AM  Sophia Young  has presented today for surgery, with the diagnosis of Left UPJ Stone.  The various methods of treatment have been discussed with the patient and family. After consideration of risks, benefits and other options for treatment, the patient has consented to  Procedure(s) with comments: CYSTOSCOPY WITH RETROGRADE PYELOGRAM, URETEROSCOPY AND STENT PLACEMENT (Left) - pt knows to arrive at 6:00 BALLOON DILATION- possible dilation (Left) as a surgical intervention.  The patient's history has been reviewed, patient examined, no change in status, stable for surgery.  I have reviewed the patient's chart and labs.  Questions were answered to the patient's satisfaction.     Wilkie Aye

## 2023-06-03 ENCOUNTER — Encounter (HOSPITAL_COMMUNITY): Payer: Self-pay | Admitting: Urology

## 2023-06-03 NOTE — Anesthesia Postprocedure Evaluation (Signed)
Anesthesia Post Note  Patient: Sophia Young  Procedure(s) Performed: CYSTOSCOPY WITH RETROGRADE PYELOGRAM, DIAGNOSTIC URETEROSCOPY AND STENT PLACEMENT (Left: Ureter)  Patient location during evaluation: Phase II Anesthesia Type: General Level of consciousness: awake Pain management: pain level controlled Vital Signs Assessment: post-procedure vital signs reviewed and stable Respiratory status: spontaneous breathing and respiratory function stable Cardiovascular status: blood pressure returned to baseline and stable Postop Assessment: no headache and no apparent nausea or vomiting Anesthetic complications: no Comments: Late entry   No notable events documented.   Last Vitals:  Vitals:   06/02/23 1000 06/02/23 1015  BP: 129/62 (!) 150/62  Pulse: 63 60  Resp: 20 20  Temp:  (!) 36.3 C  SpO2: 100% 100%    Last Pain:  Vitals:   06/02/23 1015  TempSrc: Oral  PainSc: 0-No pain                 Windell Norfolk

## 2023-06-07 ENCOUNTER — Telehealth: Payer: Self-pay

## 2023-06-07 NOTE — Telephone Encounter (Addendum)
Patient is asking if she needs to have an abx called in after surgery?  She never received the abx if so can you send it to [facility] on [address]

## 2023-06-07 NOTE — Telephone Encounter (Signed)
Patient left a voice message 06-07-2023.  Had a recent procedure with stent placement. Wants to know if she will need to take antibiotics.  Also, needing a call back to discuss post care.  Please advise.  Call:  [phone]

## 2023-06-27 ENCOUNTER — Ambulatory Visit: Payer: PPO | Admitting: Urology

## 2023-06-27 VITALS — BP 146/72 | HR 71

## 2023-06-27 DIAGNOSIS — Q6211 Congenital occlusion of ureteropelvic junction: Secondary | ICD-10-CM

## 2023-06-27 DIAGNOSIS — N130 Hydronephrosis with ureteropelvic junction obstruction: Secondary | ICD-10-CM

## 2023-06-27 NOTE — Progress Notes (Signed)
06/27/2023 4:06 PM   Sophia Young June 03, 1956 [id number]  Referring provider: Benita Stabile, MD [address] [name],  [place] [address]  No chief complaint on file.   HPI: Sophia Young is a 67yo here for followup for left UPJ obstruction. She underwent left ureteral stent placement 3 weeks ago. She denies any flank pain. Diagnostic ureteroscopy showed 8 Folz UPJ opening.    PMH: Past Medical History:  Diagnosis Date   Chronic back pain    Complication of anesthesia    Diabetes mellitus without complication (HCC)    History of kidney stones    Hydronephrosis of left kidney    chronic   Hypercholesteremia    Hypertension    Hypothyroidism    PONV (postoperative nausea and vomiting)    Ureteral stricture, left     Surgical History: Past Surgical History:  Procedure Laterality Date   ABDOMINAL HYSTERECTOMY     BACK SURGERY     BIOPSY  06/01/2017   Procedure: BIOPSY;  Surgeon: Corbin Ade, MD;  [facility];  Service: Endoscopy;;  colon   CHOLECYSTECTOMY     COLONOSCOPY N/A 06/01/2017   Procedure: COLONOSCOPY;  Surgeon: Corbin Ade, MD;  [facility];  Service: Endoscopy;  Laterality: N/A;  7:30 AM   COLONOSCOPY WITH PROPOFOL N/A 02/27/2021   Procedure: COLONOSCOPY WITH PROPOFOL;  Surgeon: Lanelle Bal, DO;  [facility];  Service: Endoscopy;  Laterality: N/A;  10:30am   CYSTOSCOPY W/ RETROGRADES Bilateral 11/06/2020   Procedure: CYSTOSCOPY WITH RETROGRADE PYELOGRAM;  Surgeon: Malen Gauze, MD;  [facility]: [facility];  Service: Urology;  Laterality: Bilateral;   CYSTOSCOPY W/ URETERAL STENT PLACEMENT Left 11/06/2020   Procedure: CYSTOSCOPY WITH STENT REPLACEMENT;  Surgeon: Malen Gauze, MD;  [facility];  Service: Urology;  Laterality: Left;   CYSTOSCOPY WITH RETROGRADE PYELOGRAM, URETEROSCOPY AND STENT PLACEMENT Left 06/02/2023   Procedure: CYSTOSCOPY WITH RETROGRADE PYELOGRAM, DIAGNOSTIC URETEROSCOPY AND STENT  PLACEMENT;  Surgeon: Malen Gauze, MD;  [facility];  Service: Urology;  Laterality: Left;  pt knows to arrive at 6:00   FLEXIBLE SIGMOIDOSCOPY N/A 06/03/2021   Procedure: FLEXIBLE SIGMOIDOSCOPY;  Surgeon: Andria Meuse, MD;  [facility];  Service: General;  Laterality: N/A;   POLYPECTOMY  06/01/2017   Procedure: POLYPECTOMY;  Surgeon: Corbin Ade, MD;  [facility];  Service: Endoscopy;;  colon    ROBOT ASSISTED PYELOPLASTY Left 11/01/2022   Procedure: XI ROBOTIC ASSISTED PYELOPLASTY WITH URETERAL STENT PLACEMENT;  Surgeon: Malen Gauze, MD;  [facility];  Service: Urology;  Laterality: Left;   SP DIL URETER Left 2011   URETERAL STENT PLACEMENT Left 2011   URETEROSCOPY Left 11/06/2020   Procedure: URETEROSCOPY- diagnostic;  Surgeon: Malen Gauze, MD;  [facility];  Service: Urology;  Laterality: Left;    Home Medications:  Allergies as of 06/27/2023   No Known Allergies      Medication List        Accurate as of June 27, 2023  4:06 PM. If you have any questions, ask your nurse or doctor.          aspirin 81 MG chewable tablet Chew 81 mg by mouth every other day.   Centrum Silver 50+Women Tabs Take 1 tablet by mouth 4 (four) times a week.   cholecalciferol 25 MCG (1000 UNIT) tablet Commonly known as: VITAMIN D3 Take 1,000 Units by mouth 4 (four)  times a week.   levothyroxine 75 MCG tablet Commonly known as: SYNTHROID Take 75 mcg by mouth daily before breakfast.   losartan 25 MG tablet Commonly known as: COZAAR Take 50 mg by mouth daily.   ondansetron 4 MG tablet Commonly known as: Zofran Take 1 tablet (4 mg total) by mouth daily as needed for nausea or vomiting.   oxyCODONE-acetaminophen 5-325 MG tablet Commonly known as: Percocet Take 1 tablet by mouth every 4 (four) hours as needed for severe pain.   Ozempic (1 MG/DOSE) 4 MG/3ML Sopn Generic drug: Semaglutide (1 MG/DOSE) Inject 1 mg into the skin  every Thursday.   pravastatin 40 MG tablet Commonly known as: PRAVACHOL Take 40 mg by mouth daily.   sulfamethoxazole-trimethoprim 800-160 MG tablet Commonly known as: BACTRIM DS Take 1 tablet by mouth every 12 (twelve) hours.   sulfamethoxazole-trimethoprim 800-160 MG tablet Commonly known as: BACTRIM DS Take 1 tablet by mouth daily. To start daily after completing the bactrim twice daily for 1 week.  Please continue daily until 1 week after surgery.   triamterene-hydrochlorothiazide 37.5-25 MG capsule Commonly known as: DYAZIDE Take 1 capsule by mouth daily.        Allergies: No Known Allergies  Family History: Family History  Problem Relation Age of Onset   Prostate cancer Brother    Crohn's disease Cousin    Colon cancer Neg Hx     Social History:  reports that she has quit smoking. Her smoking use included cigarettes. She has a 1.5 pack-year smoking history. She has never used smokeless tobacco. She reports that she does not drink alcohol and does not use drugs.  ROS: All other review of systems were reviewed and are negative except what is noted above in HPI  Physical Exam: BP (!) 146/72   Pulse 71   Constitutional:  Alert and oriented, No acute distress. HEENT: [place] AT, moist mucus membranes.  Trachea midline, no masses. Cardiovascular: No clubbing, cyanosis, or edema. Respiratory: Normal respiratory effort, no increased work of breathing. GI: Abdomen is soft, nontender, nondistended, no abdominal masses GU: No CVA tenderness.  Lymph: No cervical or inguinal lymphadenopathy. Skin: No rashes, bruises or suspicious lesions. Neurologic: Grossly intact, no focal deficits, moving all 4 extremities. Psychiatric: Normal mood and affect.  Laboratory Data: Lab Results  Component Value Date   WBC 13.2 (H) 11/02/2022   HGB 11.8 (L) 11/02/2022   HCT 35.5 (L) 11/02/2022   MCV 94.2 11/02/2022   PLT 240 11/02/2022    Lab Results  Component Value Date   CREATININE  1.10 (H) 05/30/2023    No results found for: "PSA"  No results found for: "TESTOSTERONE"  Lab Results  Component Value Date   HGBA1C 4.8 10/28/2022    Urinalysis    Component Value Date/Time   COLORURINE BROWN (A) 10/26/2020 1622   APPEARANCEUR Clear 05/09/2023 1135   LABSPEC 1.023 10/26/2020 1622   PHURINE 6.0 10/26/2020 1622   GLUCOSEU Negative 05/09/2023 1135   HGBUR LARGE (A) 10/26/2020 1622   BILIRUBINUR Negative 05/09/2023 1135   KETONESUR NEGATIVE 10/26/2020 1622   PROTEINUR Negative 05/09/2023 1135   PROTEINUR >=300 (A) 10/26/2020 1622   UROBILINOGEN 4.0 (H) 01/22/2010 0859   NITRITE Negative 05/09/2023 1135   NITRITE NEGATIVE 10/26/2020 1622   LEUKOCYTESUR 2+ (A) 05/09/2023 1135   LEUKOCYTESUR MODERATE (A) 10/26/2020 1622    Lab Results  Component Value Date   LABMICR See below: 05/09/2023   WBCUA >30 (A) 05/09/2023   LABEPIT >10 (A)  05/09/2023   MUCUS Present 12/19/2020   BACTERIA Few (A) 05/09/2023    Pertinent Imaging: *** Results for orders placed during the hospital encounter of 02/10/10  DG Abd 1 View  Narrative Clinical Data: Left renal calculus  ABDOMEN - 1 VIEW  Comparison: None Correlation:  CT abdomen pelvis 01/22/2010  Findings: Surgical clip left pelvis. Elongated calcification left pelvis, corresponding to a vascular calcification seen on preceding CT. Facet degenerative changes lower lumbar spine. No definite urinary tract calcification identified. Bowel gas pattern normal. No acute bony findings.  IMPRESSION: No definite urinary tract calcification identified.  Provider: Wilkie Aye  No results found for this or any previous visit.  No results found for this or any previous visit.  No results found for this or any previous visit.  Results for orders placed during the hospital encounter of 01/17/23  Ultrasound renal complete  Narrative CLINICAL DATA:  Left hydronephrosis.  UPJ obstruction.  EXAM: RENAL /  URINARY TRACT ULTRASOUND COMPLETE  COMPARISON:  Ultrasound July 07, 2022  FINDINGS: Right Kidney:  Renal measurements: 11.1 x 5 x 4.8 cm = volume: 139 mL. Echogenicity within normal limits. No mass or hydronephrosis visualized.  Left Kidney:  Renal measurements: 16.5 x 8.4 x 7.8 cm = volume: 571 mL. Moderate to severe hydronephrosis with cortical thinning is essentially unchanged.  Bladder:  The bladder was empty and could not be evaluated.  Other:  None.  IMPRESSION: 1. Moderate to severe left hydronephrosis with cortical thinning is essentially unchanged. 2. The right kidney is normal. 3. The bladder was empty and could not be evaluated.   Electronically Signed By: Gerome Sam III M.D. On: 01/17/2023 15:18  No valid procedures specified. No results found for this or any previous visit.  No results found for this or any previous visit.   Assessment & Plan:    1. Hydronephrosis with ureteropelvic junction (UPJ) obstruction *** - NM Renal Imaging Flow W/Pharm   No follow-ups on file.  Wilkie Aye, MD  [facility] [place]

## 2023-06-28 ENCOUNTER — Other Ambulatory Visit: Payer: Self-pay | Admitting: Family Medicine

## 2023-06-28 ENCOUNTER — Ambulatory Visit
Admission: RE | Admit: 2023-06-28 | Discharge: 2023-06-28 | Disposition: A | Discharge status: Home / Self Care | Payer: PPO | Source: Ambulatory Visit | Attending: Family Medicine | Admitting: Family Medicine

## 2023-06-28 DIAGNOSIS — N63 Unspecified lump in unspecified breast: Secondary | ICD-10-CM

## 2023-06-28 DIAGNOSIS — N6320 Unspecified lump in the left breast, unspecified quadrant: Secondary | ICD-10-CM | POA: Diagnosis not present

## 2023-07-01 DIAGNOSIS — E1165 Type 2 diabetes mellitus with hyperglycemia: Secondary | ICD-10-CM | POA: Diagnosis not present

## 2023-07-01 DIAGNOSIS — N1831 Chronic kidney disease, stage 3a: Secondary | ICD-10-CM | POA: Diagnosis not present

## 2023-07-01 DIAGNOSIS — E039 Hypothyroidism, unspecified: Secondary | ICD-10-CM | POA: Diagnosis not present

## 2023-07-01 DIAGNOSIS — E559 Vitamin D deficiency, unspecified: Secondary | ICD-10-CM | POA: Diagnosis not present

## 2023-07-04 ENCOUNTER — Ambulatory Visit (HOSPITAL_COMMUNITY)
Admission: RE | Admit: 2023-07-04 | Discharge: 2023-07-04 | Disposition: A | Discharge status: Home / Self Care | Payer: PPO | Source: Ambulatory Visit | Attending: Urology | Admitting: Urology

## 2023-07-04 ENCOUNTER — Encounter (HOSPITAL_COMMUNITY): Payer: Self-pay

## 2023-07-04 DIAGNOSIS — Q6211 Congenital occlusion of ureteropelvic junction: Secondary | ICD-10-CM | POA: Insufficient documentation

## 2023-07-04 DIAGNOSIS — R339 Retention of urine, unspecified: Secondary | ICD-10-CM | POA: Diagnosis not present

## 2023-07-04 DIAGNOSIS — N130 Hydronephrosis with ureteropelvic junction obstruction: Secondary | ICD-10-CM | POA: Diagnosis not present

## 2023-07-04 MED ORDER — FUROSEMIDE 10 MG/ML IJ SOLN
40.0000 mg | Freq: Once | INTRAMUSCULAR | Status: AC
  Administered 2023-07-04: 40 mg via INTRAVENOUS

## 2023-07-04 MED ORDER — TECHNETIUM TC 99M MERTIATIDE
5.0000 | Freq: Once | INTRAVENOUS | Status: AC | PRN
  Administered 2023-07-04: 5.5 via INTRAVENOUS

## 2023-07-04 MED ORDER — FUROSEMIDE 10 MG/ML IJ SOLN
INTRAMUSCULAR | Status: AC
  Filled 2023-07-04: qty 4

## 2023-07-05 ENCOUNTER — Encounter: Payer: Self-pay | Admitting: Urology

## 2023-07-05 NOTE — Patient Instructions (Signed)
Hydronephrosis  Hydronephrosis is the swelling of one or both kidneys due to a blockage that stops urine from flowing out of the body. Kidneys filter waste from the blood and produce urine. This condition can lead to kidney failure and may become life-threatening if not treated promptly. What are the causes? In infants and children, common causes include problems that occur when a baby is developing in the womb. These can include problems in the kidneys or in the tubes that drain urine into the bladder (ureters). In adults, common causes include: Kidney stones. Pregnancy. A tumor or cyst in the abdomen or pelvis. An enlarged prostate gland. Other causes include: Bladder infection. Scar tissue from a previous surgery or injury. A blood clot. Cancer of the prostate, bladder, uterus, ovary, or colon. What are the signs or symptoms? Symptoms of this condition include: Pain or discomfort in your side (flank) or abdomen. Swelling in your abdomen. Nausea and vomiting. Fever. Pain when passing urine. Feelings of urgency when you need to urinate. Urinating more often than normal. In some cases, you may not have any symptoms. How is this diagnosed? This condition may be diagnosed based on: Your symptoms and medical history. A physical exam. Blood and urine tests. Imaging tests, such as an ultrasound, CT scan, or MRI. A procedure to look at your urinary tract and bladder by inserting a scope into the urethra (cystoscopy). How is this treated? Treatment for this condition depends on where the blockage is, how long it has been there, and what caused it. The goal of treatment is to remove the blockage. Treatment may include: Antibiotic medicines to treat or prevent infection. A procedure to place a small, thin tube (stent) into a blocked ureter. The stent will keep the ureter open so that urine can drain through it. A nonsurgical procedure that crushes kidney stones with shock waves  (extracorporeal shock wave lithotripsy). If kidney failure occurs, treatment may include dialysis or a kidney transplant. Follow these instructions at home:  Take over-the-counter and prescription medicines only as told by your health care provider. If you were prescribed an antibiotic medicine, take it exactly as told by your health care provider. Do not stop taking the antibiotic even if you start to feel better. Rest and return to your normal activities as told by your health care provider. Ask your health care provider what activities are safe for you. Drink enough fluid to keep your urine pale yellow. Keep all follow-up visits. This is important. Contact a health care provider if: You continue to have symptoms after treatment. You develop new symptoms. Your urine becomes cloudy or bloody. You have a fever. Get help right away if: You have severe flank or abdominal pain. You cannot drink fluids without vomiting. Summary Hydronephrosis is the swelling of one or both kidneys due to a blockage that stops urine from flowing out of the body. Hydronephrosis can lead to kidney failure and may become life-threatening if not treated promptly. The goal of treatment is to remove the blockage. It may include a procedure to insert a stent into a blocked ureter, a procedure to break up kidney stones, or taking antibiotic medicines. Follow your health care provider's instructions for taking care of yourself at home, including instructions about drinking fluids, taking medicines, and limiting activities. This information is not intended to replace advice given to you by your health care provider. Make sure you discuss any questions you have with your health care provider. Document Revised: 12/18/2019 Document Reviewed: 12/18/2019 Elsevier   Patient Education  2024 [employer]  

## 2023-07-07 DIAGNOSIS — I10 Essential (primary) hypertension: Secondary | ICD-10-CM | POA: Diagnosis not present

## 2023-07-07 DIAGNOSIS — E1165 Type 2 diabetes mellitus with hyperglycemia: Secondary | ICD-10-CM | POA: Diagnosis not present

## 2023-07-07 DIAGNOSIS — E559 Vitamin D deficiency, unspecified: Secondary | ICD-10-CM | POA: Diagnosis not present

## 2023-07-07 DIAGNOSIS — I129 Hypertensive chronic kidney disease with stage 1 through stage 4 chronic kidney disease, or unspecified chronic kidney disease: Secondary | ICD-10-CM | POA: Diagnosis not present

## 2023-07-07 DIAGNOSIS — N823 Fistula of vagina to large intestine: Secondary | ICD-10-CM | POA: Diagnosis not present

## 2023-07-07 DIAGNOSIS — N1831 Chronic kidney disease, stage 3a: Secondary | ICD-10-CM | POA: Diagnosis not present

## 2023-07-07 DIAGNOSIS — N1330 Unspecified hydronephrosis: Secondary | ICD-10-CM | POA: Diagnosis not present

## 2023-07-07 DIAGNOSIS — E039 Hypothyroidism, unspecified: Secondary | ICD-10-CM | POA: Diagnosis not present

## 2023-07-07 DIAGNOSIS — F411 Generalized anxiety disorder: Secondary | ICD-10-CM | POA: Diagnosis not present

## 2023-07-07 DIAGNOSIS — E782 Mixed hyperlipidemia: Secondary | ICD-10-CM | POA: Diagnosis not present

## 2023-07-07 DIAGNOSIS — E1122 Type 2 diabetes mellitus with diabetic chronic kidney disease: Secondary | ICD-10-CM | POA: Diagnosis not present

## 2023-07-20 ENCOUNTER — Ambulatory Visit (INDEPENDENT_AMBULATORY_CARE_PROVIDER_SITE_OTHER): Payer: PPO | Admitting: Urology

## 2023-07-20 VITALS — BP 153/66 | HR 58

## 2023-07-20 DIAGNOSIS — Q6211 Congenital occlusion of ureteropelvic junction: Secondary | ICD-10-CM

## 2023-07-20 DIAGNOSIS — Z466 Encounter for fitting and adjustment of urinary device: Secondary | ICD-10-CM | POA: Diagnosis not present

## 2023-07-20 NOTE — Progress Notes (Signed)
   07/20/23  CC: followup left UPJ obstruction   HPI: Ms Taucher is here for followup for a left UPJ obstruction. Lasix renogram showed a poorly functioning left kidney. We have elected to proceed with stent removal Blood pressure (!) 153/66, pulse (!) 58. NED. A&Ox3.   No respiratory distress   Abd soft, NT, ND Normal external genitalia with patent urethral meatus  Cystoscopy Procedure Note  Patient identification was confirmed, informed consent was obtained, and patient was prepped using Betadine solution.  Lidocaine jelly was administered per urethral meatus.    Procedure: - Flexible cystoscope introduced, without any difficulty.   - Thorough search of the bladder revealed:    normal urethral meatus    normal urothelium    no stones    no ulcers     no tumors    no urethral polyps    no trabeculation  - Ureteral orifices were normal in position and appearance. -using a grasper the left ureteral stent was removed intact  Post-Procedure: - Patient tolerated the procedure well  Assessment/ Plan: Followup 6 weeks with a renal US   No follow-ups on file.  Wilkie Aye, MD

## 2023-07-24 ENCOUNTER — Encounter: Payer: Self-pay | Admitting: Urology

## 2023-07-24 NOTE — Patient Instructions (Signed)
Hydronephrosis  Hydronephrosis is the swelling of one or both kidneys due to a blockage that stops urine from flowing out of the body. Kidneys filter waste from the blood and produce urine. This condition can lead to kidney failure and may become life-threatening if not treated promptly. What are the causes? In infants and children, common causes include problems that occur when a baby is developing in the womb. These can include problems in the kidneys or in the tubes that drain urine into the bladder (ureters). In adults, common causes include: Kidney stones. Pregnancy. A tumor or cyst in the abdomen or pelvis. An enlarged prostate gland. Other causes include: Bladder infection. Scar tissue from a previous surgery or injury. A blood clot. Cancer of the prostate, bladder, uterus, ovary, or colon. What are the signs or symptoms? Symptoms of this condition include: Pain or discomfort in your side (flank) or abdomen. Swelling in your abdomen. Nausea and vomiting. Fever. Pain when passing urine. Feelings of urgency when you need to urinate. Urinating more often than normal. In some cases, you may not have any symptoms. How is this diagnosed? This condition may be diagnosed based on: Your symptoms and medical history. A physical exam. Blood and urine tests. Imaging tests, such as an ultrasound, CT scan, or MRI. A procedure to look at your urinary tract and bladder by inserting a scope into the urethra (cystoscopy). How is this treated? Treatment for this condition depends on where the blockage is, how long it has been there, and what caused it. The goal of treatment is to remove the blockage. Treatment may include: Antibiotic medicines to treat or prevent infection. A procedure to place a small, thin tube (stent) into a blocked ureter. The stent will keep the ureter open so that urine can drain through it. A nonsurgical procedure that crushes kidney stones with shock waves  (extracorporeal shock wave lithotripsy). If kidney failure occurs, treatment may include dialysis or a kidney transplant. Follow these instructions at home:  Take over-the-counter and prescription medicines only as told by your health care provider. If you were prescribed an antibiotic medicine, take it exactly as told by your health care provider. Do not stop taking the antibiotic even if you start to feel better. Rest and return to your normal activities as told by your health care provider. Ask your health care provider what activities are safe for you. Drink enough fluid to keep your urine pale yellow. Keep all follow-up visits. This is important. Contact a health care provider if: You continue to have symptoms after treatment. You develop new symptoms. Your urine becomes cloudy or bloody. You have a fever. Get help right away if: You have severe flank or abdominal pain. You cannot drink fluids without vomiting. Summary Hydronephrosis is the swelling of one or both kidneys due to a blockage that stops urine from flowing out of the body. Hydronephrosis can lead to kidney failure and may become life-threatening if not treated promptly. The goal of treatment is to remove the blockage. It may include a procedure to insert a stent into a blocked ureter, a procedure to break up kidney stones, or taking antibiotic medicines. Follow your health care provider's instructions for taking care of yourself at home, including instructions about drinking fluids, taking medicines, and limiting activities. This information is not intended to replace advice given to you by your health care provider. Make sure you discuss any questions you have with your health care provider. Document Revised: 12/18/2019 Document Reviewed: 12/18/2019 Elsevier   Patient Education  2024 [employer]  

## 2023-08-29 DIAGNOSIS — H6123 Impacted cerumen, bilateral: Secondary | ICD-10-CM | POA: Diagnosis not present

## 2023-08-29 DIAGNOSIS — H900 Conductive hearing loss, bilateral: Secondary | ICD-10-CM | POA: Diagnosis not present

## 2023-09-15 ENCOUNTER — Other Ambulatory Visit: Payer: Self-pay

## 2023-09-15 DIAGNOSIS — Q6211 Congenital occlusion of ureteropelvic junction: Secondary | ICD-10-CM

## 2023-09-16 ENCOUNTER — Ambulatory Visit: Payer: PPO | Admitting: Urology

## 2023-09-29 ENCOUNTER — Ambulatory Visit (HOSPITAL_COMMUNITY)
Admission: RE | Admit: 2023-09-29 | Discharge: 2023-09-29 | Disposition: A | Discharge status: Home / Self Care | Payer: HMO | Source: Ambulatory Visit | Attending: Urology | Admitting: Urology

## 2023-09-29 DIAGNOSIS — Q6211 Congenital occlusion of ureteropelvic junction: Secondary | ICD-10-CM | POA: Insufficient documentation

## 2023-09-29 DIAGNOSIS — N1330 Unspecified hydronephrosis: Secondary | ICD-10-CM | POA: Diagnosis not present

## 2023-10-24 ENCOUNTER — Ambulatory Visit: Payer: HMO | Admitting: Urology

## 2023-10-24 VITALS — BP 165/77 | HR 62

## 2023-10-24 DIAGNOSIS — N1330 Unspecified hydronephrosis: Secondary | ICD-10-CM

## 2023-10-24 NOTE — Progress Notes (Signed)
10/24/2023 3:39 PM   Sophia Young Nov 22, 1955 [id number]  Referring provider: Benita Stabile, MD [address] [name],  [place] [address]  Followup UPJ obstruction   HPI: Sophia Young is a 68yo here for followup for hydronephrosis and left UPj obstruction. She denies nay flank pain. No hematuria since last visit. Renal US shows stable/slightly improved left hydronephrosis. She denies any worsening LUTS. NO other complaints today.    PMH: Past Medical History:  Diagnosis Date   Chronic back pain    Complication of anesthesia    Diabetes mellitus without complication (HCC)    History of kidney stones    Hydronephrosis of left kidney    chronic   Hypercholesteremia    Hypertension    Hypothyroidism    PONV (postoperative nausea and vomiting)    Ureteral stricture, left     Surgical History: Past Surgical History:  Procedure Laterality Date   ABDOMINAL HYSTERECTOMY     BACK SURGERY     BIOPSY  06/01/2017   Procedure: BIOPSY;  Surgeon: Corbin Ade, MD;  [facility];  Service: Endoscopy;;  colon   CHOLECYSTECTOMY     COLONOSCOPY N/A 06/01/2017   Procedure: COLONOSCOPY;  Surgeon: Corbin Ade, MD;  [facility];  Service: Endoscopy;  Laterality: N/A;  7:30 AM   COLONOSCOPY WITH PROPOFOL N/A 02/27/2021   Procedure: COLONOSCOPY WITH PROPOFOL;  Surgeon: Lanelle Bal, DO;  [facility];  Service: Endoscopy;  Laterality: N/A;  10:30am   CYSTOSCOPY W/ RETROGRADES Bilateral 11/06/2020   Procedure: CYSTOSCOPY WITH RETROGRADE PYELOGRAM;  Surgeon: Malen Gauze, MD;  [facility];  Service: Urology;  Laterality: Bilateral;   CYSTOSCOPY W/ URETERAL STENT PLACEMENT Left 11/06/2020   Procedure: CYSTOSCOPY WITH STENT REPLACEMENT;  Surgeon: Malen Gauze, MD;  [facility];  Service: Urology;  Laterality: Left;   CYSTOSCOPY WITH RETROGRADE PYELOGRAM, URETEROSCOPY AND STENT PLACEMENT Left 06/02/2023   Procedure: CYSTOSCOPY WITH  RETROGRADE PYELOGRAM, DIAGNOSTIC URETEROSCOPY AND STENT PLACEMENT;  Surgeon: Malen Gauze, MD;  [facility];  Service: Urology;  Laterality: Left;  pt knows to arrive at 6:00   FLEXIBLE SIGMOIDOSCOPY N/A 06/03/2021   Procedure: FLEXIBLE SIGMOIDOSCOPY;  Surgeon: Andria Meuse, MD;  [facility];  Service: General;  Laterality: N/A;   POLYPECTOMY  06/01/2017   Procedure: POLYPECTOMY;  Surgeon: Corbin Ade, MD;  [facility];  Service: Endoscopy;;  colon    ROBOT ASSISTED PYELOPLASTY Left 11/01/2022   Procedure: XI ROBOTIC ASSISTED PYELOPLASTY WITH URETERAL STENT PLACEMENT;  Surgeon: Malen Gauze, MD;  [facility];  Service: Urology;  Laterality: Left;   SP DIL URETER Left 2011   URETERAL STENT PLACEMENT Left 2011   URETEROSCOPY Left 11/06/2020   Procedure: URETEROSCOPY- diagnostic;  Surgeon: Malen Gauze, MD;  [facility];  Service: Urology;  Laterality: Left;    Home Medications:  Allergies as of 10/24/2023   No Known Allergies      Medication List        Accurate as of October 24, 2023  3:39 PM. If you have any questions, ask your nurse or doctor.          aspirin 81 MG chewable tablet Chew 81 mg by mouth every other day.   Centrum Silver 50+Women Tabs Take 1 tablet by mouth 4 (four) times a week.   cholecalciferol 25 MCG (1000 UNIT) tablet Commonly known as: VITAMIN D3 Take 1,000  Units by mouth 4 (four) times a week.   levothyroxine 75 MCG tablet Commonly known as: SYNTHROID Take 75 mcg by mouth daily before breakfast.   losartan 25 MG tablet Commonly known as: COZAAR Take 50 mg by mouth daily.   oxyCODONE-acetaminophen 5-325 MG tablet Commonly known as: Percocet Take 1 tablet by mouth every 4 (four) hours as needed for severe pain.   Ozempic (1 MG/DOSE) 4 MG/3ML Sopn Generic drug: Semaglutide (1 MG/DOSE) Inject 1 mg into the skin every Thursday.   pravastatin 40 MG tablet Commonly known as:  PRAVACHOL Take 40 mg by mouth daily.   triamterene-hydrochlorothiazide 37.5-25 MG capsule Commonly known as: DYAZIDE Take 1 capsule by mouth daily.        Allergies: No Known Allergies  Family History: Family History  Problem Relation Age of Onset   Prostate cancer Brother    Crohn's disease Cousin    Colon cancer Neg Hx     Social History:  reports that she has quit smoking. Her smoking use included cigarettes. She has a 1.5 pack-year smoking history. She has never used smokeless tobacco. She reports that she does not drink alcohol and does not use drugs.  ROS: All other review of systems were reviewed and are negative except what is noted above in HPI  Physical Exam: BP (!) 165/77   Pulse 62   Constitutional:  Alert and oriented, No acute distress. HEENT: [place] AT, moist mucus membranes.  Trachea midline, no masses. Cardiovascular: No clubbing, cyanosis, or edema. Respiratory: Normal respiratory effort, no increased work of breathing. GI: Abdomen is soft, nontender, nondistended, no abdominal masses GU: No CVA tenderness.  Lymph: No cervical or inguinal lymphadenopathy. Skin: No rashes, bruises or suspicious lesions. Neurologic: Grossly intact, no focal deficits, moving all 4 extremities. Psychiatric: Normal mood and affect.  Laboratory Data: Lab Results  Component Value Date   WBC 13.2 (H) 11/02/2022   HGB 11.8 (L) 11/02/2022   HCT 35.5 (L) 11/02/2022   MCV 94.2 11/02/2022   PLT 240 11/02/2022    Lab Results  Component Value Date   CREATININE 1.10 (H) 05/30/2023    No results found for: "PSA"  No results found for: "TESTOSTERONE"  Lab Results  Component Value Date   HGBA1C 4.8 10/28/2022    Urinalysis    Component Value Date/Time   COLORURINE BROWN (A) 10/26/2020 1622   APPEARANCEUR Clear 05/09/2023 1135   LABSPEC 1.023 10/26/2020 1622   PHURINE 6.0 10/26/2020 1622   GLUCOSEU Negative 05/09/2023 1135   HGBUR LARGE (A) 10/26/2020 1622    BILIRUBINUR Negative 05/09/2023 1135   KETONESUR NEGATIVE 10/26/2020 1622   PROTEINUR Negative 05/09/2023 1135   PROTEINUR >=300 (A) 10/26/2020 1622   UROBILINOGEN 4.0 (H) 01/22/2010 0859   NITRITE Negative 05/09/2023 1135   NITRITE NEGATIVE 10/26/2020 1622   LEUKOCYTESUR 2+ (A) 05/09/2023 1135   LEUKOCYTESUR MODERATE (A) 10/26/2020 1622    Lab Results  Component Value Date   LABMICR See below: 05/09/2023   WBCUA >30 (A) 05/09/2023   LABEPIT >10 (A) 05/09/2023   MUCUS Present 12/19/2020   BACTERIA Few (A) 05/09/2023    Pertinent Imaging: Renal US 09/29/2023: Images reviewed and discussed with the patient  Results for orders placed during the hospital encounter of 02/10/10  DG Abd 1 View  Narrative Clinical Data: Left renal calculus  ABDOMEN - 1 VIEW  Comparison: None Correlation:  CT abdomen pelvis 01/22/2010  Findings: Surgical clip left pelvis. Elongated calcification left pelvis, corresponding to a vascular  calcification seen on preceding CT. Facet degenerative changes lower lumbar spine. No definite urinary tract calcification identified. Bowel gas pattern normal. No acute bony findings.  IMPRESSION: No definite urinary tract calcification identified.  Provider: Wilkie Aye  No results found for this or any previous visit.  No results found for this or any previous visit.  No results found for this or any previous visit.  Results for orders placed during the hospital encounter of 09/29/23  US RENAL  Narrative CLINICAL DATA:  Left ureteropelvic junction obstruction.  EXAM: RENAL / URINARY TRACT ULTRASOUND COMPLETE  COMPARISON:  Jan 17, 2023  FINDINGS: Right Kidney:  Renal measurements: 12.5 x 5.8 x 5.4 cm = volume: 204.82 mL. Normal echotexture. There is a 0.8 x 0.6 x 0.7 cm simple cyst. No follow-up is recommended. No hydronephrosis.  Left Kidney:  Renal measurements: 13.9 x 5.7 x 5.7 cm = volume: 234.8 mL. Echogenicity within normal  limits. No mass visualized. Moderate left hydronephrosis slightly less prominent compared to prior exam.  Bladder:  Appears normal for degree of bladder distention. Right ureteral jet is noted. Postvoid volume of 40.7cc.  Other:  None.  IMPRESSION: Moderate left hydronephrosis slightly less prominent compared to prior exam.   Electronically Signed By: Sherian Rein M.D. On: 09/29/2023 11:51  No results found for this or any previous visit.  No results found for this or any previous visit.  No results found for this or any previous visit.   Assessment & Plan:    1. Hydronephrosis, unspecified hydronephrosis type (Primary) Followup 6 months with a renal US - Urinalysis, Routine w reflex microscopic   No follow-ups on file.  Wilkie Aye, MD  [facility] [place]

## 2023-10-25 LAB — URINALYSIS, ROUTINE W REFLEX MICROSCOPIC
Bilirubin, UA: NEGATIVE
Glucose, UA: NEGATIVE
Ketones, UA: NEGATIVE
Nitrite, UA: NEGATIVE
Protein,UA: NEGATIVE
RBC, UA: NEGATIVE
Specific Gravity, UA: 1.01 (ref 1.005–1.030)
Urobilinogen, Ur: 0.2 mg/dL (ref 0.2–1.0)
pH, UA: 7 (ref 5.0–7.5)

## 2023-10-25 LAB — MICROSCOPIC EXAMINATION: Bacteria, UA: NONE SEEN

## 2023-10-26 IMAGING — MG MM DIGITAL SCREENING BILAT W/ TOMO AND CAD
6 of 10 series · 6 of 30 positions shown · non-contrast
Comparison: Previous exam(s).

ACR Breast Density Category a: The breast tissue is almost entirely
fatty.

CLINICAL DATA: Screening.

EXAM:
DIGITAL SCREENING BILATERAL MAMMOGRAM WITH TOMOSYNTHESIS AND CAD
TECHNIQUE: Bilateral screening digital craniocaudal and mediolateral oblique
mammograms were obtained. Bilateral screening digital breast
tomosynthesis was performed. The images were evaluated with
computer-aided detection.

[L MLO synth-2D]
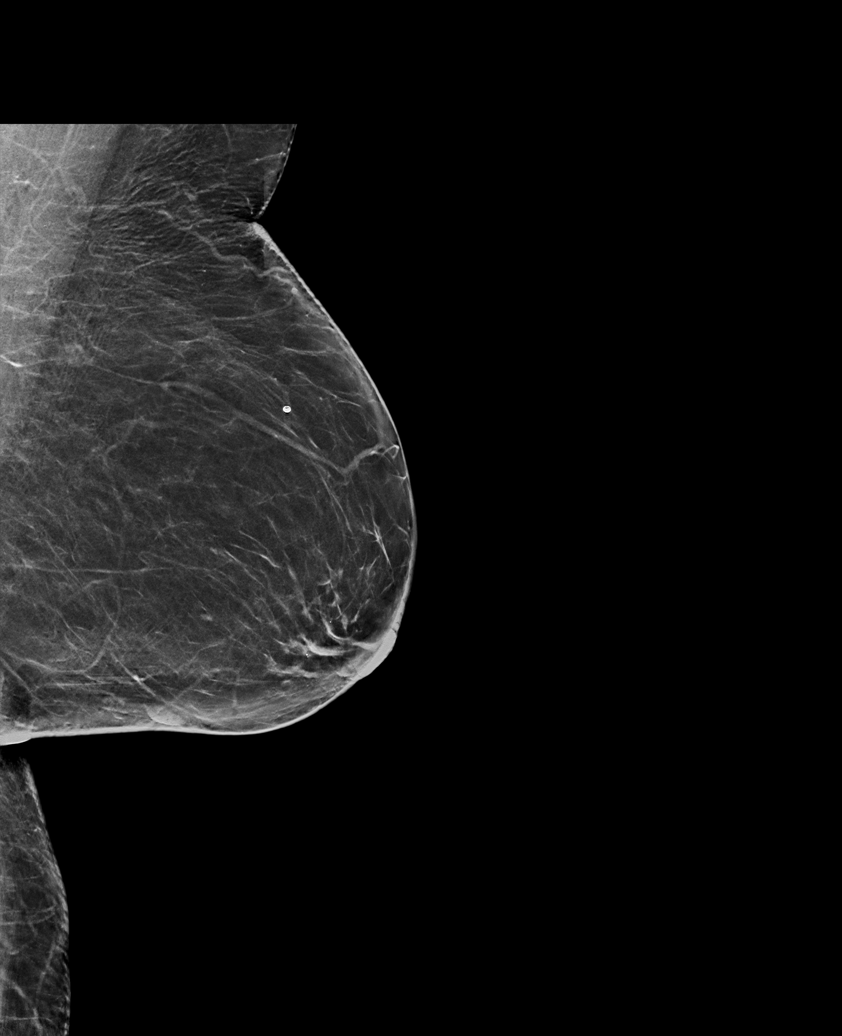

[R MLO synth-2D]
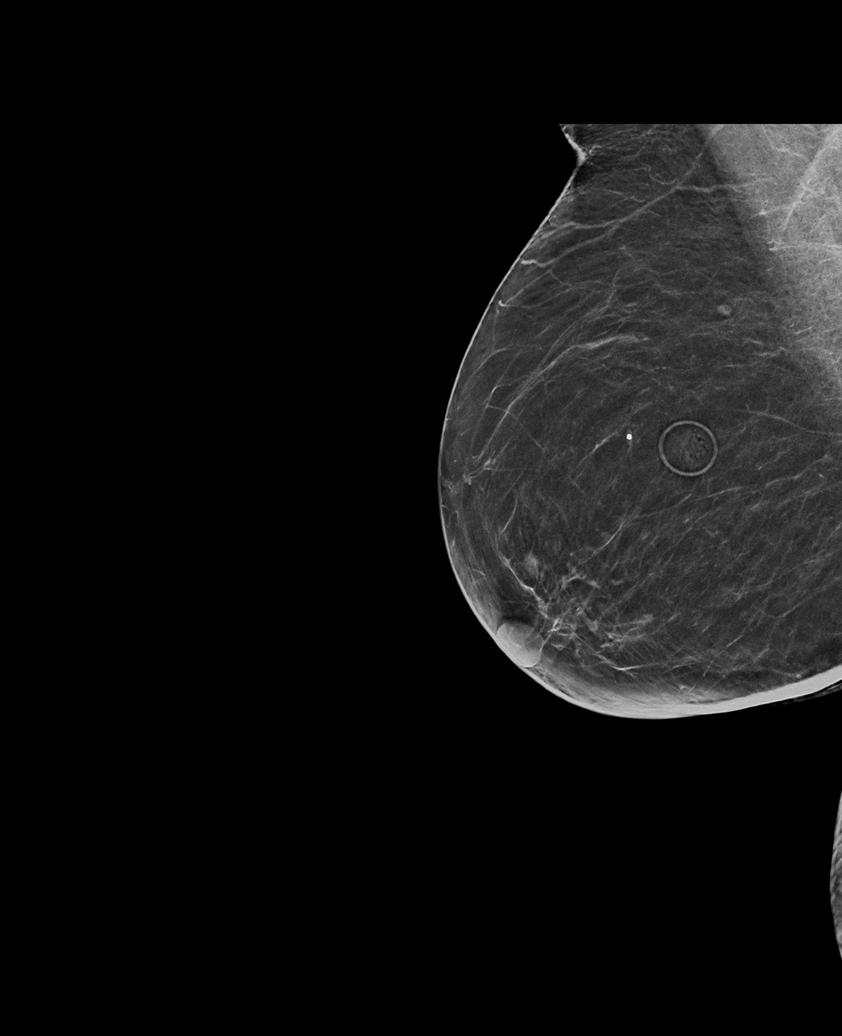

[R CC synth-2D (1 of 2)]
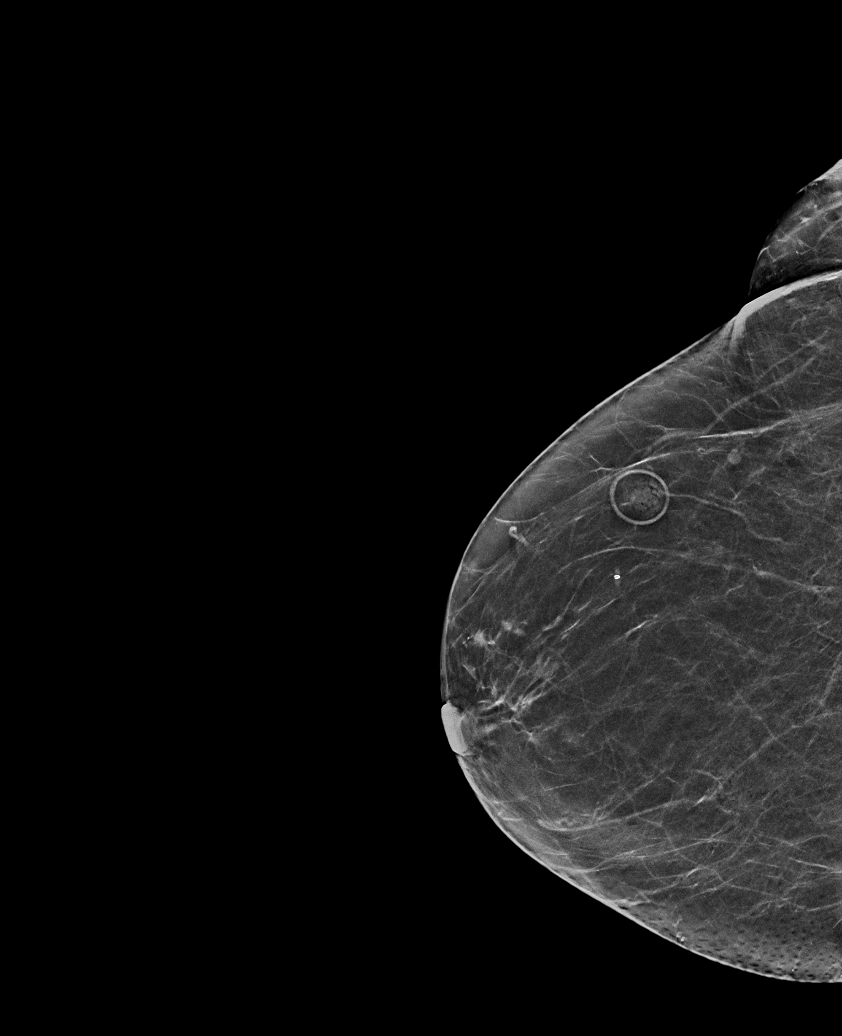

[L CC synth-2D]
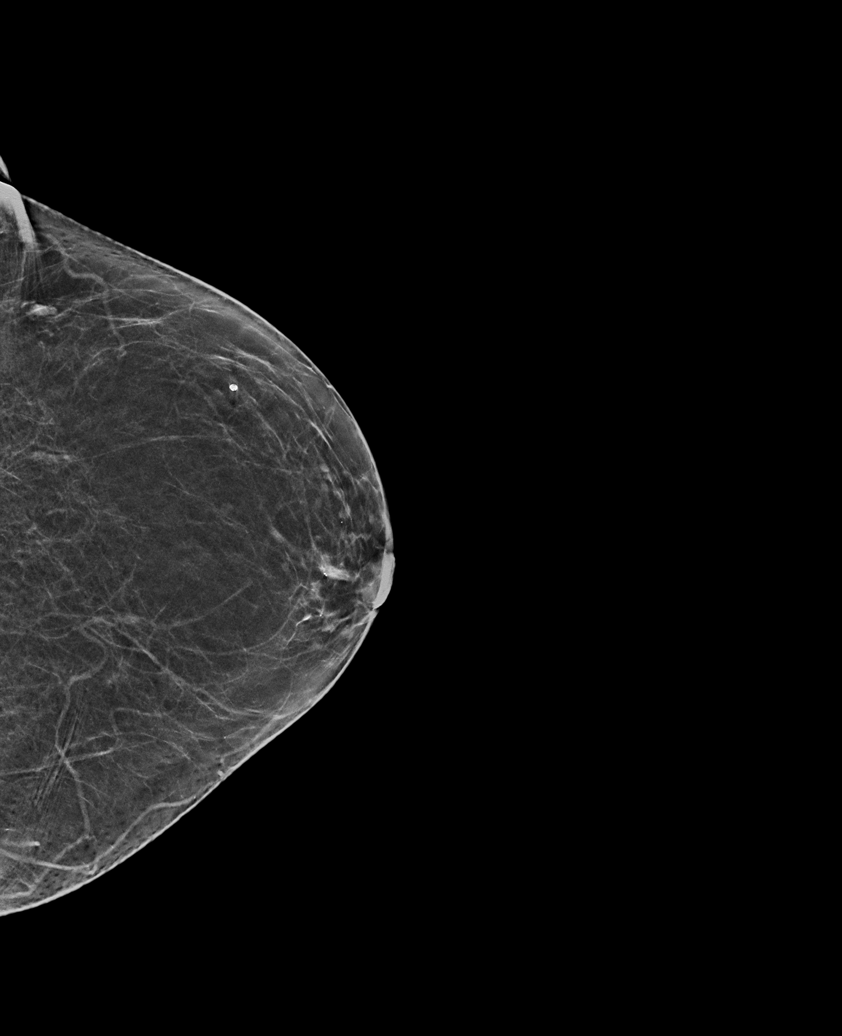

[R CC synth-2D (2 of 2)]
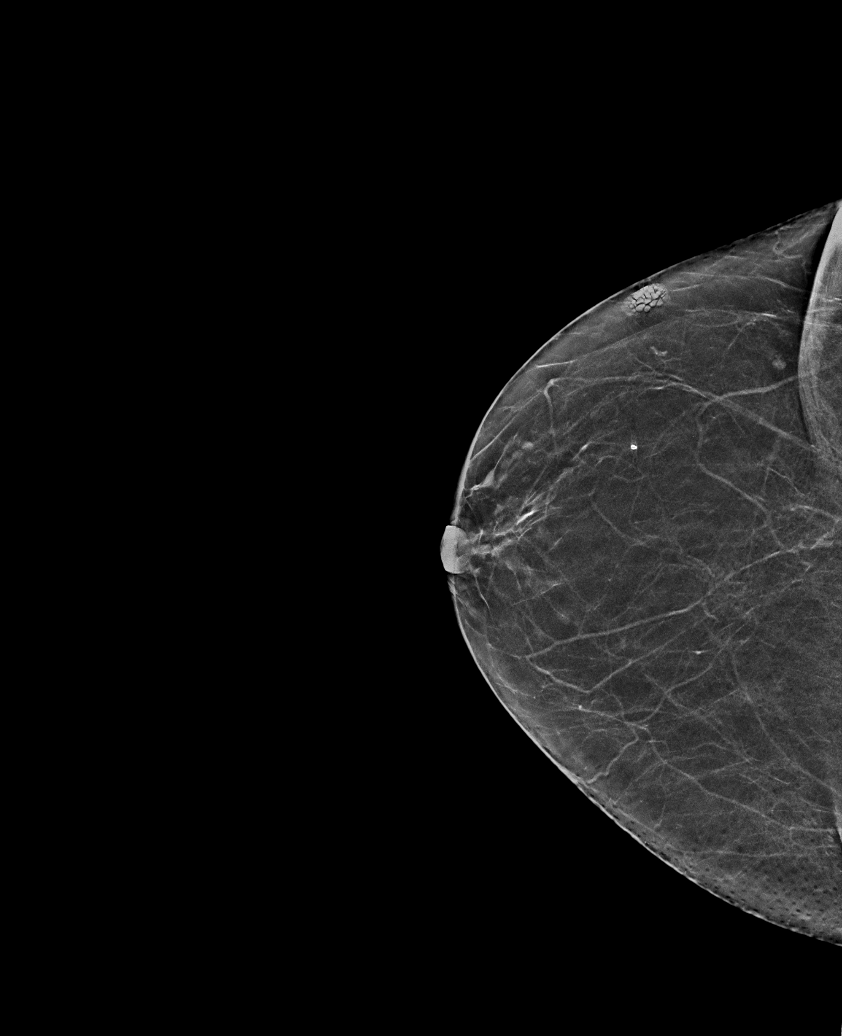

[R CC tomo · tomo slice 27/52.0]
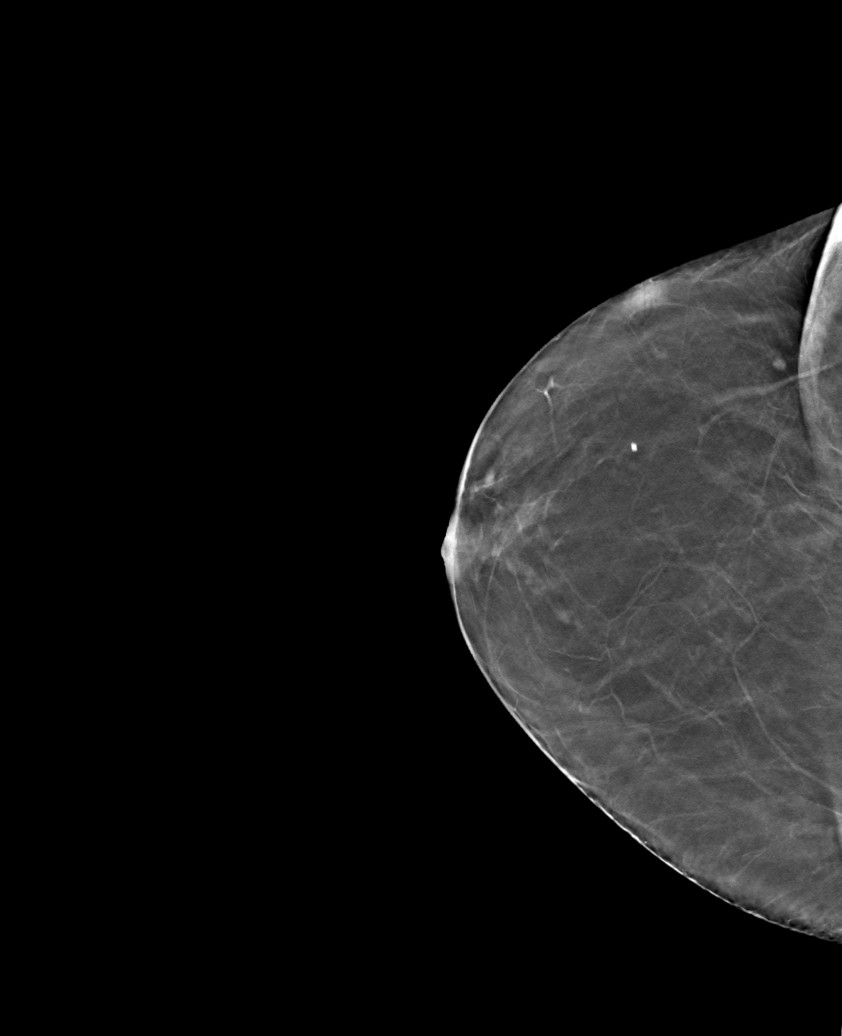

[6 of 30 positions shown; findings below may reference images not displayed]

FINDINGS: There are no findings suspicious for malignancy.
IMPRESSION: No mammographic evidence of malignancy. A result letter of this
screening mammogram will be mailed directly to the patient.

RECOMMENDATION:
Screening mammogram in one year. (Code:0E-3-N98)

BI-RADS CATEGORY  1: Negative.

## 2023-11-01 ENCOUNTER — Encounter: Payer: Self-pay | Admitting: Urology

## 2023-11-01 NOTE — Patient Instructions (Signed)
 Hydronephrosis  Hydronephrosis is the swelling of one or both kidneys due to a blockage that stops urine from flowing out of the body. Kidneys filter waste from the blood and produce urine. This condition can lead to kidney failure and may become life-threatening if not treated promptly. What are the causes? In infants and children, common causes include problems that occur when a baby is developing in the womb. These can include problems in the kidneys or in the tubes that drain urine into the bladder (ureters). In adults, common causes include: Kidney stones. Pregnancy. A tumor or cyst in the abdomen or pelvis. An enlarged prostate gland. Other causes include: Bladder infection. Scar tissue from a previous surgery or injury. A blood clot. Cancer of the prostate, bladder, uterus, ovary, or colon. What are the signs or symptoms? Symptoms of this condition include: Pain or discomfort in your side (flank) or abdomen. Swelling in your abdomen. Nausea and vomiting. Fever. Pain when passing urine. Feelings of urgency when you need to urinate. Urinating more often than normal. In some cases, you may not have any symptoms. How is this diagnosed? This condition may be diagnosed based on: Your symptoms and medical history. A physical exam. Blood and urine tests. Imaging tests, such as an ultrasound, CT scan, or MRI. A procedure to look at your urinary tract and bladder by inserting a scope into the urethra (cystoscopy). How is this treated? Treatment for this condition depends on where the blockage is, how long it has been there, and what caused it. The goal of treatment is to remove the blockage. Treatment may include: Antibiotic medicines to treat or prevent infection. A procedure to place a small, thin tube (stent) into a blocked ureter. The stent will keep the ureter open so that urine can drain through it. A nonsurgical procedure that crushes kidney stones with shock waves  (extracorporeal shock wave lithotripsy). If kidney failure occurs, treatment may include dialysis or a kidney transplant. Follow these instructions at home:  Take over-the-counter and prescription medicines only as told by your health care provider. If you were prescribed an antibiotic medicine, take it exactly as told by your health care provider. Do not stop taking the antibiotic even if you start to feel better. Rest and return to your normal activities as told by your health care provider. Ask your health care provider what activities are safe for you. Drink enough fluid to keep your urine pale yellow. Keep all follow-up visits. This is important. Contact a health care provider if: You continue to have symptoms after treatment. You develop new symptoms. Your urine becomes cloudy or bloody. You have a fever. Get help right away if: You have severe flank or abdominal pain. You cannot drink fluids without vomiting. Summary Hydronephrosis is the swelling of one or both kidneys due to a blockage that stops urine from flowing out of the body. Hydronephrosis can lead to kidney failure and may become life-threatening if not treated promptly. The goal of treatment is to remove the blockage. It may include a procedure to insert a stent into a blocked ureter, a procedure to break up kidney stones, or taking antibiotic medicines. Follow your health care provider's instructions for taking care of yourself at home, including instructions about drinking fluids, taking medicines, and limiting activities. This information is not intended to replace advice given to you by your health care provider. Make sure you discuss any questions you have with your health care provider. Document Revised: 05/31/2023 Document Reviewed: 05/31/2023 Elsevier  Patient Education  2024 [employer].

## 2023-12-29 DIAGNOSIS — E039 Hypothyroidism, unspecified: Secondary | ICD-10-CM | POA: Diagnosis not present

## 2023-12-29 DIAGNOSIS — E559 Vitamin D deficiency, unspecified: Secondary | ICD-10-CM | POA: Diagnosis not present

## 2023-12-29 DIAGNOSIS — N1831 Chronic kidney disease, stage 3a: Secondary | ICD-10-CM | POA: Diagnosis not present

## 2023-12-29 DIAGNOSIS — E1165 Type 2 diabetes mellitus with hyperglycemia: Secondary | ICD-10-CM | POA: Diagnosis not present

## 2024-01-05 DIAGNOSIS — I129 Hypertensive chronic kidney disease with stage 1 through stage 4 chronic kidney disease, or unspecified chronic kidney disease: Secondary | ICD-10-CM | POA: Diagnosis not present

## 2024-01-05 DIAGNOSIS — E782 Mixed hyperlipidemia: Secondary | ICD-10-CM | POA: Diagnosis not present

## 2024-01-05 DIAGNOSIS — N1330 Unspecified hydronephrosis: Secondary | ICD-10-CM | POA: Diagnosis not present

## 2024-01-05 DIAGNOSIS — E559 Vitamin D deficiency, unspecified: Secondary | ICD-10-CM | POA: Diagnosis not present

## 2024-01-05 DIAGNOSIS — E1165 Type 2 diabetes mellitus with hyperglycemia: Secondary | ICD-10-CM | POA: Diagnosis not present

## 2024-01-05 DIAGNOSIS — R809 Proteinuria, unspecified: Secondary | ICD-10-CM | POA: Diagnosis not present

## 2024-01-05 DIAGNOSIS — N1831 Chronic kidney disease, stage 3a: Secondary | ICD-10-CM | POA: Diagnosis not present

## 2024-01-05 DIAGNOSIS — E039 Hypothyroidism, unspecified: Secondary | ICD-10-CM | POA: Diagnosis not present

## 2024-01-05 DIAGNOSIS — I10 Essential (primary) hypertension: Secondary | ICD-10-CM | POA: Diagnosis not present

## 2024-01-05 DIAGNOSIS — B029 Zoster without complications: Secondary | ICD-10-CM | POA: Diagnosis not present

## 2024-01-05 DIAGNOSIS — N823 Fistula of vagina to large intestine: Secondary | ICD-10-CM | POA: Diagnosis not present

## 2024-04-16 ENCOUNTER — Ambulatory Visit (HOSPITAL_COMMUNITY)
Admission: RE | Admit: 2024-04-16 | Discharge: 2024-04-16 | Disposition: A | Discharge status: Home / Self Care | Payer: HMO | Source: Ambulatory Visit | Attending: Urology | Admitting: Urology

## 2024-04-16 DIAGNOSIS — N1330 Unspecified hydronephrosis: Secondary | ICD-10-CM | POA: Insufficient documentation

## 2024-04-16 DIAGNOSIS — N281 Cyst of kidney, acquired: Secondary | ICD-10-CM | POA: Diagnosis not present

## 2024-04-23 ENCOUNTER — Ambulatory Visit: Payer: HMO | Admitting: Urology

## 2024-05-01 ENCOUNTER — Ambulatory Visit: Admitting: Urology

## 2024-05-21 ENCOUNTER — Ambulatory Visit: Admitting: Urology

## 2024-05-30 ENCOUNTER — Encounter: Payer: Self-pay | Admitting: Urology

## 2024-05-30 ENCOUNTER — Ambulatory Visit: Admitting: Urology

## 2024-05-30 VITALS — BP 154/74 | HR 62 | Temp 98.2°F | Resp 20 | Wt 141.0 lb

## 2024-05-30 DIAGNOSIS — N1330 Unspecified hydronephrosis: Secondary | ICD-10-CM

## 2024-05-30 DIAGNOSIS — N390 Urinary tract infection, site not specified: Secondary | ICD-10-CM

## 2024-05-30 LAB — MICROSCOPIC EXAMINATION: Epithelial Cells (non renal): >10 /HPF — AB (ref 0–10)

## 2024-05-30 LAB — URINALYSIS, ROUTINE W REFLEX MICROSCOPIC
Bilirubin, UA: NEGATIVE
Glucose, UA: NEGATIVE
Ketones, UA: NEGATIVE
Nitrite, UA: NEGATIVE
Protein,UA: NEGATIVE
RBC, UA: NEGATIVE
Specific Gravity, UA: 1.02 (ref 1.005–1.030)
Urobilinogen, Ur: 0.2 mg/dL (ref 0.2–1.0)
pH, UA: 6 (ref 5.0–7.5)

## 2024-05-30 NOTE — Patient Instructions (Signed)
 Hydronephrosis  Hydronephrosis is the swelling of one or both kidneys due to a blockage that stops urine from flowing out of the body. Kidneys filter waste from the blood and produce urine. This condition can lead to kidney failure and may become life-threatening if not treated promptly. What are the causes? In infants and children, common causes include problems that occur when a baby is developing in the womb. These can include problems in the kidneys or in the tubes that drain urine into the bladder (ureters). In adults, common causes include: Kidney stones. Pregnancy. A tumor or cyst in the abdomen or pelvis. An enlarged prostate gland. Other causes include: Bladder infection. Scar tissue from a previous surgery or injury. A blood clot. Cancer of the prostate, bladder, uterus, ovary, or colon. What are the signs or symptoms? Symptoms of this condition include: Pain or discomfort in your side (flank) or abdomen. Swelling in your abdomen. Nausea and vomiting. Fever. Pain when passing urine. Feelings of urgency when you need to urinate. Urinating more often than normal. In some cases, you may not have any symptoms. How is this diagnosed? This condition may be diagnosed based on: Your symptoms and medical history. A physical exam. Blood and urine tests. Imaging tests, such as an ultrasound, CT scan, or MRI. A procedure to look at your urinary tract and bladder by inserting a scope into the urethra (cystoscopy). How is this treated? Treatment for this condition depends on where the blockage is, how long it has been there, and what caused it. The goal of treatment is to remove the blockage. Treatment may include: Antibiotic medicines to treat or prevent infection. A procedure to place a small, thin tube (stent) into a blocked ureter. The stent will keep the ureter open so that urine can drain through it. A nonsurgical procedure that crushes kidney stones with shock waves  (extracorporeal shock wave lithotripsy). If kidney failure occurs, treatment may include dialysis or a kidney transplant. Follow these instructions at home:  Take over-the-counter and prescription medicines only as told by your health care provider. If you were prescribed an antibiotic medicine, take it exactly as told by your health care provider. Do not stop taking the antibiotic even if you start to feel better. Rest and return to your normal activities as told by your health care provider. Ask your health care provider what activities are safe for you. Drink enough fluid to keep your urine pale yellow. Keep all follow-up visits. This is important. Contact a health care provider if: You continue to have symptoms after treatment. You develop new symptoms. Your urine becomes cloudy or bloody. You have a fever. Get help right away if: You have severe flank or abdominal pain. You cannot drink fluids without vomiting. Summary Hydronephrosis is the swelling of one or both kidneys due to a blockage that stops urine from flowing out of the body. Hydronephrosis can lead to kidney failure and may become life-threatening if not treated promptly. The goal of treatment is to remove the blockage. It may include a procedure to insert a stent into a blocked ureter, a procedure to break up kidney stones, or taking antibiotic medicines. Follow your health care provider's instructions for taking care of yourself at home, including instructions about drinking fluids, taking medicines, and limiting activities. This information is not intended to replace advice given to you by your health care provider. Make sure you discuss any questions you have with your health care provider. Document Revised: 05/31/2023 Document Reviewed: 05/31/2023 Elsevier  Patient Education  2024 [employer].

## 2024-05-30 NOTE — Progress Notes (Signed)
 05/30/2024 10:49 AM   Sophia Young 05-13-1956 [id number]  Referring provider: Shona Norleen PEDLAR, MD [address] [place],  [place] [address]     HPI: [name] is a [phone] here for followup for left UPJ obstruction. She denies any flank pain. She denies any significant LUTS. Renal US  04/16/24 shows stable mild left hydronephrosis. No other complaints today   PMH: Past Medical History:  Diagnosis Date   Chronic back pain    Complication of anesthesia    Diabetes mellitus without complication (HCC)    History of kidney stones    Hydronephrosis of left kidney    chronic   Hypercholesteremia    Hypertension    Hypothyroidism    PONV (postoperative nausea and vomiting)    Ureteral stricture, left     Surgical History: Past Surgical History:  Procedure Laterality Date   ABDOMINAL HYSTERECTOMY     BACK SURGERY     BIOPSY  06/01/2017   Procedure: BIOPSY;  Surgeon: Shaaron Lamar HERO, MD;  [facility]: [facility];  Service: Endoscopy;;  colon   CHOLECYSTECTOMY     COLONOSCOPY N/A 06/01/2017   Procedure: COLONOSCOPY;  Surgeon: Shaaron Lamar HERO, MD;  [facility]: [facility];  Service: Endoscopy;  Laterality: N/A;  7:30 AM   COLONOSCOPY WITH PROPOFOL  N/A 02/27/2021   Procedure: COLONOSCOPY WITH PROPOFOL ;  Surgeon: Cindie Carlin POUR, DO;  [facility]: [facility];  Service: Endoscopy;  Laterality: N/A;  10:30am   CYSTOSCOPY W/ RETROGRADES Bilateral 11/06/2020   Procedure: CYSTOSCOPY WITH RETROGRADE PYELOGRAM;  Surgeon: Sherrilee Belvie CROME, MD;  Location: [facility];  Service: Urology;  Laterality: Bilateral;   CYSTOSCOPY W/ URETERAL STENT PLACEMENT Left 11/06/2020   Procedure: CYSTOSCOPY WITH STENT REPLACEMENT;  Surgeon: Sherrilee Belvie CROME, MD;  Location: [facility];  Service: Urology;  Laterality: Left;   CYSTOSCOPY WITH RETROGRADE PYELOGRAM, URETEROSCOPY AND STENT PLACEMENT Left 06/02/2023   Procedure: CYSTOSCOPY WITH RETROGRADE PYELOGRAM, DIAGNOSTIC URETEROSCOPY AND STENT PLACEMENT;  Surgeon:  Sherrilee Belvie CROME, MD;  Location: [facility];  Service: Urology;  Laterality: Left;  pt knows to arrive at 6:00   FLEXIBLE SIGMOIDOSCOPY N/A 06/03/2021   Procedure: FLEXIBLE SIGMOIDOSCOPY;  Surgeon: Teresa Lonni HERO, MD;  Location: [facility];  Service: General;  Laterality: N/A;   POLYPECTOMY  06/01/2017   Procedure: POLYPECTOMY;  Surgeon: Shaaron Lamar HERO, MD;  [facility];  Service: Endoscopy;;  colon    ROBOT ASSISTED PYELOPLASTY Left 11/01/2022   Procedure: XI ROBOTIC ASSISTED PYELOPLASTY WITH URETERAL STENT PLACEMENT;  Surgeon: Sherrilee Belvie CROME, MD;  Location: [facility];  Service: Urology;  Laterality: Left;   SP DIL URETER Left 2011   URETERAL STENT PLACEMENT Left 2011   URETEROSCOPY Left 11/06/2020   Procedure: URETEROSCOPY- diagnostic;  Surgeon: Sherrilee Belvie CROME, MD;  Location: [facility];  Service: Urology;  Laterality: Left;    Home Medications:  Allergies as of 05/30/2024   No Known Allergies      Medication List        Accurate as of May 30, 2024 10:49 AM. If you have any questions, ask your nurse or doctor.          aspirin  81 MG chewable tablet Chew 81 mg by mouth every other day.   Centrum Silver 50+Women Tabs Take 1 tablet by mouth 4 (four) times a week.   cholecalciferol 25 MCG (1000 UNIT) tablet Commonly known as: VITAMIN D3 Take 1,000 Units by mouth 4 (four) times a week.  levothyroxine  75 MCG tablet Commonly known as: SYNTHROID  Take 75 mcg by mouth daily before breakfast.   losartan  25 MG tablet Commonly known as: COZAAR  Take 50 mg by mouth daily.   oxyCODONE -acetaminophen  5-325 MG tablet Commonly known as: Percocet Take 1 tablet by mouth every 4 (four) hours as needed for severe pain.   Ozempic (1 MG/DOSE) 4 MG/3ML Sopn Generic drug: Semaglutide (1 MG/DOSE) Inject 1 mg into the skin every Thursday.   pravastatin  40 MG tablet Commonly known as: PRAVACHOL  Take 40 mg by mouth daily.   triamterene -hydrochlorothiazide  37.5-25 MG  capsule Commonly known as: DYAZIDE  Take 1 capsule by mouth daily.        Allergies: No Known Allergies  Family History: Family History  Problem Relation Age of Onset   Prostate cancer Brother    Crohn's disease Cousin    Colon cancer Neg Hx     Social History:  reports that she has quit smoking. Her smoking use included cigarettes. She has a 1.5 pack-year smoking history. She has never used smokeless tobacco. She reports that she does not drink alcohol and does not use drugs.  ROS: All other review of systems were reviewed and are negative except what is noted above in HPI  Physical Exam: BP (!) 154/74   Pulse 62   Constitutional:  Alert and oriented, No acute distress. HEENT: [place] AT, moist mucus membranes.  Trachea midline, no masses. Cardiovascular: No clubbing, cyanosis, or edema. Respiratory: Normal respiratory effort, no increased work of breathing. GI: Abdomen is soft, nontender, nondistended, no abdominal masses GU: No CVA tenderness.  Lymph: No cervical or inguinal lymphadenopathy. Skin: No rashes, bruises or suspicious lesions. Neurologic: Grossly intact, no focal deficits, moving all 4 extremities. Psychiatric: Normal mood and affect.  Laboratory Data: Lab Results  Component Value Date   WBC 13.2 (H) 11/02/2022   HGB 11.8 (L) 11/02/2022   HCT 35.5 (L) 11/02/2022   MCV 94.2 11/02/2022   PLT 240 11/02/2022    Lab Results  Component Value Date   CREATININE 1.10 (H) 05/30/2023    No results found for: PSA  No results found for: TESTOSTERONE  Lab Results  Component Value Date   HGBA1C 4.8 10/28/2022    Urinalysis    Component Value Date/Time   COLORURINE BROWN (A) 10/26/2020 1622   APPEARANCEUR Clear 10/24/2023 1532   LABSPEC 1.023 10/26/2020 1622   PHURINE 6.0 10/26/2020 1622   GLUCOSEU Negative 10/24/2023 1532   HGBUR LARGE (A) 10/26/2020 1622   BILIRUBINUR Negative 10/24/2023 1532   KETONESUR NEGATIVE 10/26/2020 1622   PROTEINUR  Negative 10/24/2023 1532   PROTEINUR >=300 (A) 10/26/2020 1622   UROBILINOGEN 4.0 (H) 01/22/2010 0859   NITRITE Negative 10/24/2023 1532   NITRITE NEGATIVE 10/26/2020 1622   LEUKOCYTESUR 1+ (A) 10/24/2023 1532   LEUKOCYTESUR MODERATE (A) 10/26/2020 1622    Lab Results  Component Value Date   LABMICR See below: 10/24/2023   WBCUA 6-10 (A) 10/24/2023   LABEPIT 0-10 10/24/2023   MUCUS Present 12/19/2020   BACTERIA None seen 10/24/2023    Pertinent Imaging: Renal US  04/16/24: Images reviewed and discussed with the patient  Results for orders placed during the hospital encounter of 02/10/10  DG Abd 1 View  Narrative Clinical Data: Left renal calculus  ABDOMEN - 1 VIEW  Comparison: None Correlation:  CT abdomen pelvis 01/22/2010  Findings: Surgical clip left pelvis. Elongated calcification left pelvis, corresponding to a vascular calcification seen on preceding CT. Facet degenerative changes lower lumbar spine.  No definite urinary tract calcification identified. Bowel gas pattern normal. No acute bony findings.  IMPRESSION: No definite urinary tract calcification identified.  Provider: Angeline Lesches  No results found for this or any previous visit.  No results found for this or any previous visit.  No results found for this or any previous visit.  Results for orders placed during the hospital encounter of 04/16/24  US  RENAL  Narrative CLINICAL DATA:  Hydronephrosis.  EXAM: RENAL / URINARY TRACT ULTRASOUND COMPLETE  COMPARISON:  09/29/2023  FINDINGS: Right Kidney:  Renal measurements: 11.2 x 5.3 x 6.0 cm = volume: 187 mL. Echogenicity within normal limits. No mass. Minimal prominence of the intrarenal collecting system which persisted after voiding. 8 mm cyst.  Left Kidney:  Renal measurements: 12.1 x 5.8 x 6.0 cm = volume: 220 mL. Echogenicity within normal limits. No mass. Mild-to-moderate stable hydronephrosis which persisted after  voiding.  Bladder:  Appears normal for degree of bladder distention.  Other:  None.  IMPRESSION: 1. Normal size kidneys with minimal prominence of the right intrarenal collecting system and stable mild-to-moderate left hydronephrosis.  2.  8 mm right renal cyst.   Electronically Signed By: Toribio Agreste M.D. On: 04/26/2024 15:49  No results found for this or any previous visit.  No results found for this or any previous visit.  No results found for this or any previous visit.   Assessment & Plan:    1. Hydronephrosis, unspecified hydronephrosis type (Primary) Followup 6 months with a renal US  - Urinalysis, Routine w reflex microscopic  2. Recurrent UTI resolved   No follow-ups on file.  Belvie Clara, MD  [facility] [place]

## 2024-06-20 ENCOUNTER — Other Ambulatory Visit: Payer: Self-pay | Admitting: Internal Medicine

## 2024-06-20 DIAGNOSIS — Z1231 Encounter for screening mammogram for malignant neoplasm of breast: Secondary | ICD-10-CM

## 2024-06-29 ENCOUNTER — Ambulatory Visit
Admission: RE | Admit: 2024-06-29 | Discharge: 2024-06-29 | Disposition: A | Discharge status: Home / Self Care | Source: Ambulatory Visit | Attending: Internal Medicine | Admitting: Internal Medicine

## 2024-06-29 DIAGNOSIS — Z1231 Encounter for screening mammogram for malignant neoplasm of breast: Secondary | ICD-10-CM | POA: Diagnosis not present

## 2024-07-02 DIAGNOSIS — E559 Vitamin D deficiency, unspecified: Secondary | ICD-10-CM | POA: Diagnosis not present

## 2024-07-02 DIAGNOSIS — E039 Hypothyroidism, unspecified: Secondary | ICD-10-CM | POA: Diagnosis not present

## 2024-07-02 DIAGNOSIS — E1165 Type 2 diabetes mellitus with hyperglycemia: Secondary | ICD-10-CM | POA: Diagnosis not present

## 2024-07-02 DIAGNOSIS — N1831 Chronic kidney disease, stage 3a: Secondary | ICD-10-CM | POA: Diagnosis not present

## 2024-07-05 DIAGNOSIS — E559 Vitamin D deficiency, unspecified: Secondary | ICD-10-CM | POA: Diagnosis not present

## 2024-07-05 DIAGNOSIS — N1831 Chronic kidney disease, stage 3a: Secondary | ICD-10-CM | POA: Diagnosis not present

## 2024-07-05 DIAGNOSIS — E1165 Type 2 diabetes mellitus with hyperglycemia: Secondary | ICD-10-CM | POA: Diagnosis not present

## 2024-07-05 DIAGNOSIS — R809 Proteinuria, unspecified: Secondary | ICD-10-CM | POA: Diagnosis not present

## 2024-07-05 DIAGNOSIS — I10 Essential (primary) hypertension: Secondary | ICD-10-CM | POA: Diagnosis not present

## 2024-07-05 DIAGNOSIS — Z0000 Encounter for general adult medical examination without abnormal findings: Secondary | ICD-10-CM | POA: Diagnosis not present

## 2024-07-05 DIAGNOSIS — I129 Hypertensive chronic kidney disease with stage 1 through stage 4 chronic kidney disease, or unspecified chronic kidney disease: Secondary | ICD-10-CM | POA: Diagnosis not present

## 2024-07-05 DIAGNOSIS — E039 Hypothyroidism, unspecified: Secondary | ICD-10-CM | POA: Diagnosis not present

## 2024-07-05 DIAGNOSIS — N823 Fistula of vagina to large intestine: Secondary | ICD-10-CM | POA: Diagnosis not present

## 2024-07-05 DIAGNOSIS — E782 Mixed hyperlipidemia: Secondary | ICD-10-CM | POA: Diagnosis not present

## 2024-07-05 DIAGNOSIS — Z0001 Encounter for general adult medical examination with abnormal findings: Secondary | ICD-10-CM | POA: Diagnosis not present

## 2024-07-30 DIAGNOSIS — E119 Type 2 diabetes mellitus without complications: Secondary | ICD-10-CM | POA: Diagnosis not present

## 2024-11-26 ENCOUNTER — Other Ambulatory Visit (HOSPITAL_COMMUNITY)

## 2024-12-03 ENCOUNTER — Ambulatory Visit: Admitting: Urology
